# Patient Record
Sex: Female | Born: 1990 | Race: White | Hispanic: No | Marital: Single | Smoking: Never smoker
Health system: Southern US, Community
[De-identification: ages and names within clinical notes are randomized; demographics above are authoritative.]

## PROBLEM LIST (undated history)

## (undated) DIAGNOSIS — Z803 Family history of malignant neoplasm of breast: Secondary | ICD-10-CM

## (undated) DIAGNOSIS — N390 Urinary tract infection, site not specified: Secondary | ICD-10-CM

## (undated) DIAGNOSIS — T801XXA Vascular complications following infusion, transfusion and therapeutic injection, initial encounter: Secondary | ICD-10-CM

## (undated) DIAGNOSIS — Z8489 Family history of other specified conditions: Secondary | ICD-10-CM

## (undated) DIAGNOSIS — I809 Phlebitis and thrombophlebitis of unspecified site: Secondary | ICD-10-CM

## (undated) DIAGNOSIS — T8859XA Other complications of anesthesia, initial encounter: Secondary | ICD-10-CM

## (undated) DIAGNOSIS — K219 Gastro-esophageal reflux disease without esophagitis: Secondary | ICD-10-CM

## (undated) DIAGNOSIS — T4145XA Adverse effect of unspecified anesthetic, initial encounter: Secondary | ICD-10-CM

## (undated) HISTORY — PX: ABDOMINAL HYSTERECTOMY: SHX81

## (undated) HISTORY — PX: TUBAL LIGATION: SHX77

## (undated) HISTORY — DX: Family history of malignant neoplasm of breast: Z80.3

## (undated) HISTORY — PX: TONSILLECTOMY: SUR1361

---
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
## 2008-06-10 ENCOUNTER — Emergency Department (HOSPITAL_COMMUNITY): Admission: EM | Admit: 2008-06-10 | Discharge: 2008-06-11 | Payer: Self-pay | Admitting: Emergency Medicine

## 2008-08-14 ENCOUNTER — Emergency Department (HOSPITAL_COMMUNITY): Admission: EM | Admit: 2008-08-14 | Discharge: 2008-08-14 | Payer: Self-pay | Admitting: Emergency Medicine

## 2009-03-06 ENCOUNTER — Ambulatory Visit (HOSPITAL_COMMUNITY): Admission: RE | Admit: 2009-03-06 | Discharge: 2009-03-07 | Payer: Self-pay | Admitting: Obstetrics & Gynecology

## 2009-03-08 ENCOUNTER — Inpatient Hospital Stay (HOSPITAL_COMMUNITY): Admission: AD | Admit: 2009-03-08 | Discharge: 2009-03-08 | Payer: Self-pay | Admitting: Obstetrics and Gynecology

## 2009-04-02 ENCOUNTER — Emergency Department (HOSPITAL_COMMUNITY): Admission: EM | Admit: 2009-04-02 | Discharge: 2009-04-02 | Payer: Self-pay | Admitting: Family Medicine

## 2010-03-30 LAB — URINALYSIS, ROUTINE W REFLEX MICROSCOPIC
Bilirubin Urine: NEGATIVE
Bilirubin Urine: NEGATIVE
Glucose, UA: NEGATIVE mg/dL
Ketones, ur: NEGATIVE mg/dL
Nitrite: NEGATIVE
Protein, ur: NEGATIVE mg/dL
Protein, ur: NEGATIVE mg/dL
pH: 6.5 (ref 5.0–8.0)

## 2010-03-30 LAB — CBC
HCT: 38.7 % (ref 36.0–46.0)
Hemoglobin: 14.5 g/dL (ref 12.0–15.0)
MCHC: 33.8 g/dL (ref 30.0–36.0)
MCV: 92.1 fL (ref 78.0–100.0)
Platelets: 221 10*3/uL (ref 150–400)
RBC: 4.2 MIL/uL (ref 3.87–5.11)
RBC: 4.66 MIL/uL (ref 3.87–5.11)
RDW: 12.5 % (ref 11.5–15.5)
RDW: 12.7 % (ref 11.5–15.5)
WBC: 11 10*3/uL — ABNORMAL HIGH (ref 4.0–10.5)
WBC: 7.5 10*3/uL (ref 4.0–10.5)
WBC: 7.8 10*3/uL (ref 4.0–10.5)

## 2010-03-30 LAB — DIFFERENTIAL
Basophils Relative: 0 % (ref 0–1)
Eosinophils Relative: 3 % (ref 0–5)
Monocytes Absolute: 0.8 10*3/uL (ref 0.1–1.0)

## 2010-03-30 LAB — BASIC METABOLIC PANEL
BUN: 7 mg/dL (ref 6–23)
CO2: 23 mEq/L (ref 19–32)
Calcium: 8.6 mg/dL (ref 8.4–10.5)
Creatinine, Ser: 0.59 mg/dL (ref 0.4–1.2)
Sodium: 136 mEq/L (ref 135–145)

## 2010-03-30 LAB — URINE MICROSCOPIC-ADD ON

## 2010-04-11 LAB — URINALYSIS, ROUTINE W REFLEX MICROSCOPIC
Ketones, ur: NEGATIVE mg/dL
Nitrite: POSITIVE — AB
Protein, ur: 100 mg/dL — AB
Specific Gravity, Urine: 1.015 (ref 1.005–1.030)
Urobilinogen, UA: >8 mg/dL — ABNORMAL HIGH (ref 0.0–1.0)

## 2010-04-11 LAB — BASIC METABOLIC PANEL
BUN: 17 mg/dL (ref 6–23)
Calcium: 8.9 mg/dL (ref 8.4–10.5)
Creatinine, Ser: 0.54 mg/dL (ref 0.4–1.2)
GFR calc Af Amer: >60 mL/min (ref >60)
Potassium: 3.7 mEq/L (ref 3.5–5.1)
Sodium: 137 mEq/L (ref 135–145)

## 2010-04-11 LAB — CBC
Platelets: 263 10*3/uL (ref 150–400)
RBC: 4.56 MIL/uL (ref 3.87–5.11)

## 2010-04-11 LAB — URINE MICROSCOPIC-ADD ON

## 2010-04-11 LAB — DIFFERENTIAL
Basophils Relative: 0 % (ref 0–1)
Monocytes Relative: 8 % (ref 3–12)
Neutrophils Relative %: 76 % (ref 43–77)

## 2010-04-11 LAB — WET PREP, GENITAL: Yeast Wet Prep HPF POC: NONE SEEN

## 2010-04-11 LAB — GC/CHLAMYDIA PROBE AMP, GENITAL: GC Probe Amp, Genital: NEGATIVE

## 2010-04-13 LAB — URINE CULTURE: Colony Count: 60000

## 2010-04-13 LAB — URINE MICROSCOPIC-ADD ON

## 2010-04-13 LAB — URINALYSIS, ROUTINE W REFLEX MICROSCOPIC
Glucose, UA: 100 mg/dL — AB
Nitrite: POSITIVE — AB
Specific Gravity, Urine: 1.015 (ref 1.005–1.030)
Urobilinogen, UA: 4 mg/dL — ABNORMAL HIGH (ref 0.0–1.0)

## 2010-04-13 LAB — PREGNANCY, URINE: Preg Test, Ur: NEGATIVE

## 2010-05-19 NOTE — Op Note (Signed)
NAMEELYSE, Stephanie Hayden               ACCOUNT NO.:  [id number]   MEDICAL RECORD NO.:  [id number]          PATIENT TYPE:  AMB   LOCATION:  [facility]                         FACILITY:  [facility]   PHYSICIAN:  Martina Sinner, MD DATE OF BIRTH:  [birth year]   DATE OF PROCEDURE:  11/09/2007  DATE OF DISCHARGE:                               OPERATIVE REPORT   PREOPERATIVE DIAGNOSIS:  Pelvic pain.   POSTOPERATIVE DIAGNOSIS:  Pelvic pain.   SURGERY:  Cystoscopy, bladder hydrodistention.   PROCEDURE IN DETAIL:  Rani Idler has pelvic pain and frequency.  My  index of suspicion was moderate that she had interstitial cystitis.  She  was prepped and draped in the usual fashion.  She had a little bit of  issue with ciprofloxacin IV prior to the case but in my opinion she was  not allergic to it and anesthesia agreed.   A 21 [place] scope was utilized for the examination.  Bladder mucosa and  trigone were normal.  There was no stitch, foreign body or carcinoma.  She was hydrodistended twice to 900 mL.  On reinspection of the bladder  on both occasions there was no glomerulations.  There was no bleeding.  Urethra was normal.  Trigone was normal.  Ureteral orifices did not look  like they were refluxing.  I did not instill any medicine into her  bladder.   Ms. Marthann Schiller was taken to the recovery room.  I am not convinced she has  interstitial cystitis.  I am going to review her chart and hopefully we  can help her symptoms but I am not going to label her as having  interstitial cystitis at this point in time.           ______________________________  Martina Sinner, MD  Electronically Signed     SAM/MEDQ  D:  11/09/2007  T:  11/09/2007  Job:  [id number]

## 2010-10-05 LAB — URINALYSIS, ROUTINE W REFLEX MICROSCOPIC
Bilirubin Urine: NEGATIVE
Glucose, UA: NEGATIVE
Ketones, ur: NEGATIVE
Nitrite: NEGATIVE
Specific Gravity, Urine: <1.005 — ABNORMAL LOW
pH: 6

## 2010-10-05 LAB — URINE CULTURE: Colony Count: >=100000

## 2010-10-05 LAB — URINE MICROSCOPIC-ADD ON

## 2010-10-06 LAB — POCT HEMOGLOBIN-HEMACUE: Hemoglobin: 14.6

## 2010-10-14 LAB — URINE MICROSCOPIC-ADD ON

## 2010-10-14 LAB — URINALYSIS, ROUTINE W REFLEX MICROSCOPIC
Bilirubin Urine: NEGATIVE
Nitrite: POSITIVE — AB
Specific Gravity, Urine: >1.03 — ABNORMAL HIGH
Urobilinogen, UA: 0.2
pH: 6.5

## 2010-10-15 LAB — URINALYSIS, ROUTINE W REFLEX MICROSCOPIC
Bilirubin Urine: NEGATIVE
Glucose, UA: NEGATIVE
Protein, ur: 30 — AB
pH: 5

## 2010-10-15 LAB — URINE MICROSCOPIC-ADD ON

## 2010-10-15 LAB — PREGNANCY, URINE: Preg Test, Ur: NEGATIVE

## 2010-12-23 ENCOUNTER — Inpatient Hospital Stay (HOSPITAL_COMMUNITY)
Admission: EM | Admit: 2010-12-23 | Discharge: 2010-12-26 | DRG: 886 | Disposition: A | Discharge status: Home / Self Care | Payer: BC Managed Care – PPO | Attending: Family Medicine | Admitting: Family Medicine

## 2010-12-23 ENCOUNTER — Encounter: Payer: Self-pay | Admitting: *Deleted

## 2010-12-23 DIAGNOSIS — N12 Tubulo-interstitial nephritis, not specified as acute or chronic: Secondary | ICD-10-CM | POA: Diagnosis present

## 2010-12-23 DIAGNOSIS — O2390 Unspecified genitourinary tract infection in pregnancy, unspecified trimester: Principal | ICD-10-CM | POA: Diagnosis present

## 2010-12-23 DIAGNOSIS — O2300 Infections of kidney in pregnancy, unspecified trimester: Secondary | ICD-10-CM

## 2010-12-23 HISTORY — DX: Urinary tract infection, site not specified: N39.0

## 2010-12-23 LAB — BASIC METABOLIC PANEL
BUN: 6 mg/dL (ref 6–23)
CO2: 25 mEq/L (ref 19–32)
Calcium: 9.7 mg/dL (ref 8.4–10.5)
Chloride: 99 mEq/L (ref 96–112)
Creatinine, Ser: 0.6 mg/dL (ref 0.50–1.10)
GFR calc Af Amer: >90 mL/min (ref >90)
GFR calc non Af Amer: >90 mL/min (ref >90)
Glucose, Bld: 99 mg/dL (ref 70–99)
Potassium: 3.3 mEq/L — ABNORMAL LOW (ref 3.5–5.1)
Sodium: 135 mEq/L (ref 135–145)

## 2010-12-23 LAB — HCG, QUANTITATIVE, PREGNANCY: hCG, Beta Chain, Quant, S: 28 m[IU]/mL — ABNORMAL HIGH (ref <5)

## 2010-12-23 LAB — URINALYSIS, ROUTINE W REFLEX MICROSCOPIC
Nitrite: NEGATIVE
Specific Gravity, Urine: 1.01 (ref 1.005–1.030)
Urobilinogen, UA: 2 mg/dL — ABNORMAL HIGH (ref 0.0–1.0)
pH: 6 (ref 5.0–8.0)

## 2010-12-23 LAB — CBC
HCT: 43.1 % (ref 36.0–46.0)
MCH: 29.9 pg (ref 26.0–34.0)
MCHC: 33.2 g/dL (ref 30.0–36.0)
MCV: 90 fL (ref 78.0–100.0)
RDW: 12.6 % (ref 11.5–15.5)

## 2010-12-23 LAB — URINE MICROSCOPIC-ADD ON

## 2010-12-23 LAB — PREGNANCY, URINE: Preg Test, Ur: POSITIVE

## 2010-12-23 MED ORDER — DEXTROSE 5 % IV SOLN
1.0000 g | Freq: Once | INTRAVENOUS | Status: AC
  Administered 2010-12-23: 1 g via INTRAVENOUS
  Filled 2010-12-23: qty 10

## 2010-12-23 MED ORDER — ACETAMINOPHEN 500 MG PO TABS
1000.0000 mg | ORAL_TABLET | Freq: Three times a day (TID) | ORAL | Status: DC | PRN
  Administered 2010-12-23 – 2010-12-25 (×5): 1000 mg via ORAL
  Filled 2010-12-23: qty 1
  Filled 2010-12-23 (×5): qty 2

## 2010-12-23 MED ORDER — SODIUM CHLORIDE 0.9 % IV SOLN
INTRAVENOUS | Status: AC
  Administered 2010-12-23 (×2): via INTRAVENOUS

## 2010-12-23 MED ORDER — SODIUM CHLORIDE 0.9 % IV BOLUS (SEPSIS)
1000.0000 mL | Freq: Once | INTRAVENOUS | Status: AC
  Administered 2010-12-23: 1000 mL via INTRAVENOUS

## 2010-12-23 MED ORDER — ONDANSETRON HCL 4 MG/2ML IJ SOLN: 4.0000 mg | Freq: Three times a day (TID) | INTRAMUSCULAR | Status: AC | PRN

## 2010-12-23 NOTE — ED Provider Notes (Signed)
History     CSN: [id number] Arrival date & time: 12/23/2010  6:33 PM   First MD Initiated Contact with Patient 12/23/10 1843      Chief Complaint  Patient presents with  . Fever    (Consider location/radiation/quality/duration/timing/severity/associated sxs/prior treatment) Patient is a 20 y.o. female presenting with fever.  Fever Primary symptoms of the febrile illness include fever. Primary symptoms do not include vomiting.   The patient presents with one day of left flank pain. She has been in gradually, since onset has been persistent, sharp, crampy. There is radiation to the left inguinal crease. The patient also complains of fever or dysuria. The patient's pain and fever do not improve with Tylenol. She notes mild nausea, no vomiting, no diarrhea. No vaginal discharge, no vaginal bleeding, no vaginal pain Past Medical History  Diagnosis Date  . Endometriosis   . Urinary tract infection     Past Surgical History  Procedure Date  . Endometrial ablation     No family history on file.  History  Substance Use Topics  . Smoking status: Never Smoker   . Smokeless tobacco: Not on file  . Alcohol Use: No    OB History    Grav Para Term Preterm Abortions TAB SAB Ect Mult Living                  Review of Systems  Constitutional: Positive for fever.       HPI  HENT:       HPI otherwise negative  Eyes: Negative.   Respiratory:       HPI, otherwise negative  Cardiovascular:       HPI, otherwise nmegative  Gastrointestinal: Negative for vomiting.  Genitourinary:       HPI, otherwise negative  Musculoskeletal:       HPI, otherwise negative  Skin: Negative.   Neurological: Negative for syncope.    Allergies  Coconut flavor and Morphine and related  Home Medications   Current Outpatient Rx  Name Route Sig Dispense Refill  . PSEUDOEPHEDRINE-APAP-DM 60-1000-30 MG/30ML PO LIQD Oral Take 10 mLs by mouth every 4 (four) hours as needed. For cold symptoms        BP 130/92  Pulse 133  Temp(Src) 100.6 F (38.1 C) (Oral)  Resp 20  Ht 5\' 6"  (1.676 m)  Wt 155 lb (70.308 kg)  BMI 25.02 kg/m2  SpO2 100%  LMP 11/14/2010  Physical Exam  Constitutional: She is oriented to person, place, and time. She appears well-developed and well-nourished.  HENT:  Head: Normocephalic and atraumatic.  Eyes: EOM are normal.  Cardiovascular: Regular rhythm.        Tachycardic  Pulmonary/Chest: Effort normal and breath sounds normal.  Abdominal: Normal appearance. She exhibits no distension. There is CVA tenderness.  Musculoskeletal: She exhibits no edema and no tenderness.  Neurological: She is alert and oriented to person, place, and time.  Skin: Skin is warm and dry.    ED Course  Procedures (including critical care time)  Labs Reviewed  URINALYSIS, ROUTINE W REFLEX MICROSCOPIC - Abnormal; Notable for the following:    APPearance CLOUDY (*)    Hgb urine dipstick MODERATE (*)    Ketones, ur 40 (*)    Urobilinogen, UA 2.0 (*)    Leukocytes, UA SMALL (*)    All other components within normal limits  URINE MICROSCOPIC-ADD ON - Abnormal; Notable for the following:    Squamous Epithelial / LPF FEW (*)    Bacteria, UA MANY (*)  All other components within normal limits  PREGNANCY, URINE  CBC  BASIC METABOLIC PANEL   No results found.   No diagnosis found.    MDM  This generally well young female presents with new low back pain urinary frequency, fever, generalized discomfort. On exam the patient is uncomfortable appearing, mildly febrile and tachycardic. The patient's labs are notable for evidence of urinary tract infection, with leukocytosis; concern for pyelonephritis.  Given this constellation of symptoms, and the patient's newfound pregnant status, she was admitted for IV hydration, antibiotics.        Gerhard Munch, MD 12/23/10 2221

## 2010-12-23 NOTE — ED Notes (Signed)
Patient is resting comfortably.  Pt report was called but nurse unable to take at this time.  Delay explained to pt.

## 2010-12-23 NOTE — ED Notes (Signed)
Lower back pain, urinary frequency, fever. Also states dysuria. Symptoms began last night.

## 2010-12-24 ENCOUNTER — Encounter (HOSPITAL_COMMUNITY): Payer: Self-pay | Admitting: *Deleted

## 2010-12-24 LAB — HCG, QUANTITATIVE, PREGNANCY: hCG, Beta Chain, Quant, S: 36 m[IU]/mL — ABNORMAL HIGH (ref <5)

## 2010-12-24 MED ORDER — LORAZEPAM 0.5 MG PO TABS: 0.5000 mg | ORAL_TABLET | Freq: Four times a day (QID) | ORAL | Status: DC | PRN

## 2010-12-24 MED ORDER — DEXTROSE 5 % IV SOLN
1.0000 g | INTRAVENOUS | Status: DC
  Administered 2010-12-24 – 2010-12-25 (×2): 1 g via INTRAVENOUS
  Filled 2010-12-24 (×4): qty 10

## 2010-12-24 MED ORDER — LORAZEPAM 0.5 MG PO TABS
0.5000 mg | ORAL_TABLET | Freq: Every day | ORAL | Status: DC
  Administered 2010-12-24: 0.5 mg via ORAL
  Filled 2010-12-24 (×2): qty 1

## 2010-12-24 NOTE — ED Notes (Signed)
Report called to 3A Victorino Dike, RN

## 2010-12-24 NOTE — H&P (Signed)
NAME:  Stephanie Hayden, Stephanie Hayden NO.:  [id number]  MEDICAL RECORD NO.:  [id number]  LOCATION:  A327                          FACILITY:  [facility]  PHYSICIAN:  Melvyn Novas, MDDATE OF BIRTH:  1990-07-14  DATE OF ADMISSION:  12/23/2010 DATE OF DISCHARGE:  LH                             HISTORY & PHYSICAL   The patient is a 20 year old married white female who is 8 weeks' pregnant, who apparently complained of left lower back pain preceding 36- 48 hours, presents to the ER, found to have evidence clinically and laboratory wise of UTI, with low back pain, febrile, and mildly volume depleted, concerned for pyelonephritis, was admitted and given IV Rocephin for clinical pyelonephritis.  She had a small amount of leukocytes in the urine, many bacteria noted, with a leukocytosis, white count of 17,700.  There was no nausea, vomiting, hematemesis, melena, or hematochezia.  No dyspnea.  No orthopnea or PND.  No cough or sputum.  PAST MEDICAL HISTORY:  Significant for endometriosis and a history of UTI.  PAST SURGICAL HISTORY:  Remarkable for endometrial ablation due to endometriosis.  LMP was 8 weeks ago.  ALLERGIES:  She has questionable allergies or intolerances to MORPHINE.  SOCIAL HISTORY:  She is nonsmoker and nondrinker.  Her husband is in the [employer].  CURRENT MEDICATIONS:  None.  PHYSICAL EXAMINATION:  VITAL SIGNS:  Blood pressure at present is 98/84, pulse is 82 and regular.  She is currently afebrile, was febrile on admission.  Respiratory rate is 18. HEENT:  Eyes, PERRLA.  Extraocular movements intact.  Sclerae clear. Conjunctivae pink. NECK:  Showed no JVD.  No carotid bruits, no thyromegaly, no thyroid bruits. LUNGS:  Clear to A and P.  No rales, wheeze, or rhonchi appreciable. HEART:  Regular rhythm.  No murmurs, gallops, heaves, thrills, or rubs. ABDOMEN:  Soft, nontender.  Bowel sounds normoactive.  No guarding, rebound, mass, or  hepatosplenomegaly.  There is left flank tenderness to percussion.  No right flank tenderness. EXTREMITIES:  No clubbing, cyanosis, or edema.  No cords and Homans sign negative. NEUROLOGIC:  Cranial nerves II-XII grossly intact.  The patient moves all 4 extremities.  IMPRESSION: 1. Pyelonephritis, left sided. 2. Urinary tract infection. 3. Eight-week pregnancy. 4. History of endometriosis.  PLAN:  To admit, give aggressive IV fluid hydration.  She also has mild hypokalemia, replenish potassium.  Rocephin 1 g IV q.24 hours.  Monitor BMET daily.  We will give compression stockings for DVT prophylaxis.  I will make further recommendations as the database expands.     Melvyn Novas, MD     RMD/MEDQ  D:  12/24/2010  T:  12/24/2010  Job:  [id number]

## 2010-12-24 NOTE — H&P (Signed)
[id number] 

## 2010-12-25 ENCOUNTER — Inpatient Hospital Stay (HOSPITAL_COMMUNITY): Payer: BC Managed Care – PPO

## 2010-12-25 LAB — BASIC METABOLIC PANEL
Calcium: 8.9 mg/dL (ref 8.4–10.5)
GFR calc Af Amer: >90 mL/min (ref >90)
GFR calc non Af Amer: >90 mL/min (ref >90)
Potassium: 3.5 mEq/L (ref 3.5–5.1)
Sodium: 139 mEq/L (ref 135–145)

## 2010-12-25 LAB — CBC
Hemoglobin: 12.9 g/dL (ref 12.0–15.0)
Platelets: 251 10*3/uL (ref 150–400)
RBC: 4.27 MIL/uL (ref 3.87–5.11)

## 2010-12-25 LAB — DIFFERENTIAL
Basophils Absolute: 0 10*3/uL (ref 0.0–0.1)
Basophils Relative: 0 % (ref 0–1)
Eosinophils Absolute: 0.2 10*3/uL (ref 0.0–0.7)
Neutro Abs: 4.8 10*3/uL (ref 1.7–7.7)
Neutrophils Relative %: 59 % (ref 43–77)

## 2010-12-25 LAB — HCG, QUANTITATIVE, PREGNANCY
hCG, Beta Chain, Quant, S: 43 m[IU]/mL — ABNORMAL HIGH (ref <5)
hCG, Beta Chain, Quant, S: 57 m[IU]/mL — ABNORMAL HIGH (ref <5)

## 2010-12-25 LAB — TSH: TSH: 0.762 u[IU]/mL (ref 0.350–4.500)

## 2010-12-25 NOTE — Progress Notes (Signed)
Pt states she is having a moderate amt of brownish bloody discharge.  HCG levels are trending upwards.  Spoke with nurse at patients OB-GYN office in [place] [facility]).  Pt has a f/u appt with this office Jan 3rd, 2013. Recommends a pelvic ultrasound.  Notified Dr. Janna Arch who agreed (orders entered).  Spoke with pt about plan of care.

## 2010-12-25 NOTE — Progress Notes (Signed)
NAME:  Stephanie Hayden, Stephanie Hayden NO.:  [id number]  MEDICAL RECORD NO.:  [id number]  LOCATION:  A327                          FACILITY:  [facility]  PHYSICIAN:  Melvyn Novas, MDDATE OF BIRTH:  1990-07-24  DATE OF PROCEDURE: DATE OF DISCHARGE:                                PROGRESS NOTE   The patient has clinical pyelonephritis, left side, responding to Rocephin therapy, currently defervescing.  She has had a day and half with sanguineous vaginal discharge.  No abdominal cramping, back pain other than original pyelonephritis pain.  No contractions to speak of, clinically performed quantitative beta hCG which on December 23, 2010, was 54, on December 25, 2010, today increased to 43, there is no hard objective evidence of any threatened abortion.  At present, could be just vaginal discharge through antibiotics which appear sanguinous.  She is currently on bedrest with compression stockings as DVT prophylaxis. The patient went down for pelvic ultrasound today, potassium now repleted at normal 3.5, creatinine 0.55.  Physical examination is not feasible at present, she is off the floor.  The plan right now is to get CBC in a.m., BMET in a.m., continue Rocephin 1 g IV q.24 hours.  Continue hydraulic compression for DVT prophylaxis and try re-ensure patient concerning no objective evidence of threat to abortion.     Melvyn Novas, MD     RMD/MEDQ  D:  12/25/2010  T:  12/25/2010  Job:  [id number]

## 2010-12-25 NOTE — Progress Notes (Signed)
[id number] 

## 2010-12-26 LAB — BASIC METABOLIC PANEL
CO2: 23 mEq/L (ref 19–32)
Calcium: 9.1 mg/dL (ref 8.4–10.5)
Chloride: 105 mEq/L (ref 96–112)
Creatinine, Ser: 0.6 mg/dL (ref 0.50–1.10)
GFR calc Af Amer: >90 mL/min (ref >90)
Sodium: 138 mEq/L (ref 135–145)

## 2010-12-26 LAB — HCG, QUANTITATIVE, PREGNANCY: hCG, Beta Chain, Quant, S: 85 m[IU]/mL — ABNORMAL HIGH (ref <5)

## 2010-12-26 MED ORDER — NITROFURANTOIN MONOHYD MACRO 100 MG PO CAPS: 100.0000 mg | ORAL_CAPSULE | Freq: Two times a day (BID) | ORAL | Status: AC

## 2010-12-26 NOTE — Discharge Summary (Signed)
NAME:  PHIL, CORTI NO.:  [id number]  MEDICAL RECORD NO.:  [id number]  LOCATION:  A327                          FACILITY:  [facility]  PHYSICIAN:  Melvyn Novas, MDDATE OF BIRTH:  1990-10-31  DATE OF ADMISSION:  12/23/2010 DATE OF DISCHARGE:  12/22/2012LH                              DISCHARGE SUMMARY   The patient is a 20 year old married white female who is pregnant somewhere between 1 and 5 weeks.  LMP was November 14, 2010, who presented with left-sided pyelonephritis, documented UTI in the ER, severe flank pain, fever, leukocytosis.  Essentially, the patient was admitted, placed on IV Rocephin with resolution of symptomatology over 72 hours.  She had no fever, no rigors, no chills.  Her white count defervesce from 17-9.4.  On the day prior to discharge, she had some vaginal sanguinous discharge, which might have been from antibiotic usage, was uncertain, however, serial beta hCG quantitative revealed serial escalation of her hCG over 3 successive days including the day of discharge, so there is no objective data of threatened abortion at present.  The patient was reassured and husband.  Rocephin was continued for 4-1/2 -day period, I think 5 total doses 21 g IV q.24 and subsequently discharged on Macrobid 100 mg p.o. b.i.d. for an additional 5 days.  Prescription was given to the patient.  She is to take no other medicines including aspirin or Tylenol.  She is to have bed rest, not totally, and to follow up with her GYN as an outpatient for vaginal discharge and question of sanguinous discharge.     Melvyn Novas, MD     RMD/MEDQ  D:  12/26/2010  T:  12/26/2010  Job:  [id number]

## 2010-12-26 NOTE — Progress Notes (Signed)
Pt given d/c instructions and new prescription.  Discussed home care with patient and discussed home medications, patient verbalizes understanding. F/U appointment in place with OB-GYN, pt states they will keep appointment. Pt is stable at this time. Pt ambulated to main entrance with husband and  staff member (refused wheelchair).

## 2010-12-26 NOTE — Discharge Summary (Signed)
[id number] 

## 2010-12-29 LAB — CULTURE, BLOOD (ROUTINE X 2)
Culture: NO GROWTH
Culture: NO GROWTH

## 2011-05-03 ENCOUNTER — Observation Stay: Payer: Self-pay

## 2011-05-03 LAB — URINALYSIS, COMPLETE
Bilirubin,UR: NEGATIVE
Blood: NEGATIVE
Ketone: NEGATIVE
Nitrite: NEGATIVE
Ph: 9 (ref 4.5–8.0)
RBC,UR: 2 /HPF (ref 0–5)
Squamous Epithelial: 18
WBC UR: 5 /HPF (ref 0–5)

## 2011-05-04 ENCOUNTER — Inpatient Hospital Stay: Payer: Self-pay | Admitting: Obstetrics and Gynecology

## 2011-05-04 LAB — COMPREHENSIVE METABOLIC PANEL
Albumin: 3 g/dL — ABNORMAL LOW (ref 3.4–5.0)
Anion Gap: 11 (ref 7–16)
BUN: 4 mg/dL — ABNORMAL LOW (ref 7–18)
Bilirubin,Total: 0.4 mg/dL (ref 0.2–1.0)
Calcium, Total: 8.4 mg/dL — ABNORMAL LOW (ref 8.5–10.1)
Chloride: 105 mmol/L (ref 98–107)
Co2: 21 mmol/L (ref 21–32)
EGFR (African American): >60
EGFR (Non-African Amer.): >60
Glucose: 113 mg/dL — ABNORMAL HIGH (ref 65–99)
Potassium: 3.3 mmol/L — ABNORMAL LOW (ref 3.5–5.1)
SGOT(AST): 17 U/L (ref 15–37)
Sodium: 137 mmol/L (ref 136–145)
Total Protein: 6.9 g/dL (ref 6.4–8.2)

## 2011-05-04 LAB — URINALYSIS, COMPLETE
Blood: NEGATIVE
Nitrite: NEGATIVE
Ph: 5 (ref 4.5–8.0)
Protein: 30
RBC,UR: 4 /HPF (ref 0–5)
Squamous Epithelial: 1
WBC UR: 5 /HPF (ref 0–5)

## 2011-05-05 LAB — URINE CULTURE

## 2011-05-06 LAB — BASIC METABOLIC PANEL
Anion Gap: 8 (ref 7–16)
Chloride: 109 mmol/L — ABNORMAL HIGH (ref 98–107)
Co2: 25 mmol/L (ref 21–32)
EGFR (Non-African Amer.): >60
Glucose: 80 mg/dL (ref 65–99)
Potassium: 3.3 mmol/L — ABNORMAL LOW (ref 3.5–5.1)
Sodium: 142 mmol/L (ref 136–145)

## 2011-05-07 LAB — BASIC METABOLIC PANEL
Anion Gap: 8 (ref 7–16)
Chloride: 106 mmol/L (ref 98–107)
Co2: 24 mmol/L (ref 21–32)
EGFR (African American): >60
EGFR (Non-African Amer.): >60
Glucose: 68 mg/dL (ref 65–99)
Osmolality: 273 (ref 275–301)
Potassium: 3.5 mmol/L (ref 3.5–5.1)

## 2011-08-30 ENCOUNTER — Observation Stay: Payer: Self-pay | Admitting: Advanced Practice Midwife

## 2011-08-31 ENCOUNTER — Inpatient Hospital Stay: Payer: Self-pay

## 2011-08-31 LAB — CBC WITH DIFFERENTIAL/PLATELET
Basophil %: 0.2 %
Eosinophil %: 0.9 %
HCT: 32 % — ABNORMAL LOW (ref 35.0–47.0)
HGB: 10.3 g/dL — ABNORMAL LOW (ref 12.0–16.0)
Lymphocyte %: 16.2 %
MCH: 24.8 pg — ABNORMAL LOW (ref 26.0–34.0)
MCV: 77 fL — ABNORMAL LOW (ref 80–100)
Monocyte #: 1.1 x10 3/mm — ABNORMAL HIGH (ref 0.2–0.9)
Neutrophil %: 73.2 %
RBC: 4.18 10*6/uL (ref 3.80–5.20)

## 2011-09-01 LAB — BASIC METABOLIC PANEL
Anion Gap: 10 (ref 7–16)
Calcium, Total: 9 mg/dL (ref 8.5–10.1)
Chloride: 105 mmol/L (ref 98–107)
Osmolality: 273 (ref 275–301)
Potassium: 3.7 mmol/L (ref 3.5–5.1)

## 2011-09-02 LAB — HEMATOCRIT: HCT: 29.7 % — ABNORMAL LOW (ref 35.0–47.0)

## 2012-10-10 ENCOUNTER — Emergency Department (HOSPITAL_COMMUNITY): Payer: Self-pay

## 2012-10-10 ENCOUNTER — Encounter (HOSPITAL_COMMUNITY): Payer: Self-pay | Admitting: *Deleted

## 2012-10-10 ENCOUNTER — Emergency Department (HOSPITAL_COMMUNITY)
Admission: EM | Admit: 2012-10-10 | Discharge: 2012-10-10 | Disposition: A | Discharge status: Home / Self Care | Payer: Self-pay | Attending: Emergency Medicine | Admitting: Emergency Medicine

## 2012-10-10 DIAGNOSIS — R110 Nausea: Secondary | ICD-10-CM | POA: Insufficient documentation

## 2012-10-10 DIAGNOSIS — Z8742 Personal history of other diseases of the female genital tract: Secondary | ICD-10-CM | POA: Insufficient documentation

## 2012-10-10 DIAGNOSIS — R1012 Left upper quadrant pain: Secondary | ICD-10-CM | POA: Insufficient documentation

## 2012-10-10 DIAGNOSIS — R631 Polydipsia: Secondary | ICD-10-CM | POA: Insufficient documentation

## 2012-10-10 DIAGNOSIS — R002 Palpitations: Secondary | ICD-10-CM | POA: Insufficient documentation

## 2012-10-10 DIAGNOSIS — R0602 Shortness of breath: Secondary | ICD-10-CM | POA: Insufficient documentation

## 2012-10-10 DIAGNOSIS — Z3202 Encounter for pregnancy test, result negative: Secondary | ICD-10-CM | POA: Insufficient documentation

## 2012-10-10 DIAGNOSIS — Z87440 Personal history of urinary (tract) infections: Secondary | ICD-10-CM | POA: Insufficient documentation

## 2012-10-10 DIAGNOSIS — R42 Dizziness and giddiness: Secondary | ICD-10-CM | POA: Insufficient documentation

## 2012-10-10 DIAGNOSIS — R197 Diarrhea, unspecified: Secondary | ICD-10-CM | POA: Insufficient documentation

## 2012-10-10 DIAGNOSIS — R5381 Other malaise: Secondary | ICD-10-CM | POA: Insufficient documentation

## 2012-10-10 LAB — TROPONIN I: Troponin I: <0.3 ng/mL (ref <0.30)

## 2012-10-10 LAB — CBC
HCT: 44 % (ref 36.0–46.0)
Hemoglobin: 14.9 g/dL (ref 12.0–15.0)
MCHC: 33.9 g/dL (ref 30.0–36.0)
RDW: 12.6 % (ref 11.5–15.5)
WBC: 6.2 10*3/uL (ref 4.0–10.5)

## 2012-10-10 LAB — URINALYSIS, ROUTINE W REFLEX MICROSCOPIC
Bilirubin Urine: NEGATIVE
Glucose, UA: NEGATIVE mg/dL
Nitrite: NEGATIVE
Specific Gravity, Urine: <1.005 — ABNORMAL LOW (ref 1.005–1.030)
Urobilinogen, UA: 0.2 mg/dL (ref 0.0–1.0)
pH: 6 (ref 5.0–8.0)

## 2012-10-10 LAB — BASIC METABOLIC PANEL
BUN: 7 mg/dL (ref 6–23)
Chloride: 103 mEq/L (ref 96–112)
GFR calc Af Amer: >90 mL/min (ref >90)
GFR calc non Af Amer: >90 mL/min (ref >90)
Potassium: 3.6 mEq/L (ref 3.5–5.1)
Sodium: 140 mEq/L (ref 135–145)

## 2012-10-10 LAB — HEPATIC FUNCTION PANEL
ALT: 10 U/L (ref 0–35)
AST: 15 U/L (ref 0–37)
Albumin: 4.5 g/dL (ref 3.5–5.2)
Total Protein: 8 g/dL (ref 6.0–8.3)

## 2012-10-10 LAB — URINE MICROSCOPIC-ADD ON

## 2012-10-10 NOTE — ED Notes (Signed)
Woke this a.m. "just not feeling good".  Went to work and took CBG = 84, BP was 150/100.  Has been feeling fatigued w/slight nausea.  Did 12 lead EKG at work and saw some abnormalities.  Denies any additional stressors.

## 2012-10-10 NOTE — ED Notes (Signed)
Pt states not feeling well this morning, c/o nausea-no vomiting, denies pain, states had a brief episode of dizziness, cbg 80 after eating breakfast

## 2012-10-10 NOTE — ED Provider Notes (Signed)
CSN: [id number]     Arrival date & time 10/10/12  1109 History  This chart was scribed for Audree Camel, MD, by Yevette Edwards, ED Scribe. This patient was seen in room APA11/APA11 and the patient's care was started at 11:28 AM.  First MD Initiated Contact with Patient 10/10/12 1126     Chief Complaint  Patient presents with  . Fatigue  . Nausea  . Hypertension   The history is provided by the patient. No language interpreter was used.   HPI Comments: Stephanie Hayden is a 22 y.o. female, with a h/o endometriosis,  who presents to the Emergency Department complaining of general malaise. The pt reports that upon awakening this morning, she has experienced dizziness, lightheadedness, fatigue, mild SOB, mild palpitations, polydipsia and nausea. She also experienced diarrhea yesterday. After eating breakfast and still not feeling well, she checked her BP which was 150/100 and her CBG which was 84. She reports that she has had prior EKGs performed at work, she is an EMS, and she has never had an EKG with a similar reading. She denies any chest pain, vaginal pain, vaginal discharge, or hematochezia. She reports that she drinks only one cup of coffee a day, and she denies using any supplemental herbs or drugs. The pt is a non-smoker.   Dr. Tiburcio Pea is her OB-GYN.  Past Medical History  Diagnosis Date  . Endometriosis   . Urinary tract infection    Past Surgical History  Procedure Laterality Date  . Endometrial ablation    . Cesarean section     History reviewed. No pertinent family history. History  Substance Use Topics  . Smoking status: Never Smoker   . Smokeless tobacco: Not on file  . Alcohol Use: No   No OB history provided.  Review of Systems  Constitutional: Positive for fatigue. Negative for unexpected weight change.  Respiratory: Positive for shortness of breath (Mild ).   Cardiovascular: Positive for palpitations (Mild). Negative for chest pain.  Gastrointestinal: Positive  for nausea, abdominal pain and diarrhea (One episosde yesterday). Negative for constipation and blood in stool.  Endocrine: Positive for polydipsia.  Genitourinary: Negative for vaginal discharge and vaginal pain.  Neurological: Positive for dizziness and light-headedness.  All other systems reviewed and are negative.    Allergies  Coconut flavor and Morphine and related  Home Medications  No current outpatient prescriptions on file.  Triage Vitals: BP 106/77  Pulse 83  Temp(Src) 97.9 F (36.6 C) (Oral)  Resp 16  Ht 5\' 7"  (1.702 m)  Wt 160 lb (72.576 kg)  BMI 25.05 kg/m2  SpO2 100%  Physical Exam  Nursing note and vitals reviewed. Constitutional: She is oriented to person, place, and time. She appears well-developed and well-nourished. No distress.  HENT:  Head: Normocephalic and atraumatic.  Eyes: EOM are normal.  Neck: Neck supple. No tracheal deviation present.  Cardiovascular: Normal rate, regular rhythm, normal heart sounds and intact distal pulses.   No murmur heard. Pulmonary/Chest: Effort normal and breath sounds normal. No respiratory distress. She has no wheezes.  Abdominal: There is tenderness.  Mild LUQ tenderness.   Musculoskeletal: Normal range of motion. She exhibits no tenderness.  Neurological: She is alert and oriented to person, place, and time.  Skin: Skin is warm and dry.  Psychiatric: She has a normal mood and affect. Her behavior is normal.    ED Course  Procedures (including critical care time)  DIAGNOSTIC STUDIES: Oxygen Saturation is 100% on room air, normal by  my interpretation.    COORDINATION OF CARE:  11:43 AM- Discussed treatment plan with patient, and the patient agreed to the plan.   Labs Review Labs Reviewed  BASIC METABOLIC PANEL - Abnormal; Notable for the following:    Glucose, Bld 103 (*)    All other components within normal limits  URINALYSIS, ROUTINE W REFLEX MICROSCOPIC - Abnormal; Notable for the following:     Specific Gravity, Urine <1.005 (*)    Hgb urine dipstick TRACE (*)    All other components within normal limits  URINE MICROSCOPIC-ADD ON - Abnormal; Notable for the following:    Bacteria, UA MANY (*)    All other components within normal limits  CBC  HEPATIC FUNCTION PANEL  TROPONIN I  PREGNANCY, URINE  TSH   Imaging Review Dg Chest 2 View  10/10/2012   CLINICAL DATA:  Initial encounter for fatigue, nausea, and abnormal EKG.  EXAM: CHEST  2 VIEW  COMPARISON:  None.  FINDINGS: Cardiomediastinal silhouette unremarkable. Lungs clear. Bronchovascular markings normal. Pulmonary vascularity normal. No pneumothorax. No pleural effusions. Visualized bony thorax intact.  IMPRESSION: Normal examination.   Electronically Signed   By: Hulan Saas M.D.   On: 10/10/2012 12:32    Date: 10/10/2012  Rate: 74  Rhythm: normal sinus rhythm  QRS Axis: normal  Intervals: normal  ST/T Wave abnormalities: normal  Conduction Disutrbances:QRS 102  Narrative Interpretation:   Old EKG Reviewed: none available   MDM   1. Palpitations    Patient is currently feeling normal. She only drinks minimal caffeine and does not have any alcohol intake. She's worried about an EKG that was taken at work, she showed it to me that it seems to be of poor quality. There no abnormalities that were present on that EKG that were worrisome. Her EKG shows mildly wide QRS. I discussed with Dr. branch of cardiology states that his measurements are QRS is normal and the computer is overestimating. Given her history of palpitations as a cause for her symptoms, we'll still give outpatient cardiology referral. Discussed return precautions the patient she verbalized understanding and will followup with cardiology.  I personally performed the services described in this documentation, which was scribed in my presence. The recorded information has been reviewed and is accurate.   Audree Camel, MD 10/10/12 1535

## 2013-04-07 ENCOUNTER — Emergency Department (HOSPITAL_COMMUNITY)
Admission: EM | Admit: 2013-04-07 | Discharge: 2013-04-07 | Disposition: A | Discharge status: Home / Self Care | Payer: Self-pay | Attending: Emergency Medicine | Admitting: Emergency Medicine

## 2013-04-07 ENCOUNTER — Encounter (HOSPITAL_COMMUNITY): Payer: Self-pay | Admitting: Emergency Medicine

## 2013-04-07 DIAGNOSIS — Z87440 Personal history of urinary (tract) infections: Secondary | ICD-10-CM | POA: Insufficient documentation

## 2013-04-07 DIAGNOSIS — Z79899 Other long term (current) drug therapy: Secondary | ICD-10-CM | POA: Insufficient documentation

## 2013-04-07 DIAGNOSIS — K120 Recurrent oral aphthae: Secondary | ICD-10-CM | POA: Insufficient documentation

## 2013-04-07 DIAGNOSIS — Z8742 Personal history of other diseases of the female genital tract: Secondary | ICD-10-CM | POA: Insufficient documentation

## 2013-04-07 DIAGNOSIS — M542 Cervicalgia: Secondary | ICD-10-CM | POA: Insufficient documentation

## 2013-04-07 DIAGNOSIS — J029 Acute pharyngitis, unspecified: Secondary | ICD-10-CM | POA: Insufficient documentation

## 2013-04-07 LAB — RAPID STREP SCREEN (MED CTR MEBANE ONLY): Streptococcus, Group A Screen (Direct): NEGATIVE

## 2013-04-07 MED ORDER — HYDROCODONE-ACETAMINOPHEN 5-325 MG PO TABS: 1.0000 | ORAL_TABLET | ORAL | Status: DC | PRN

## 2013-04-07 MED ORDER — ACETAMINOPHEN 500 MG PO TABS
1000.0000 mg | ORAL_TABLET | Freq: Once | ORAL | Status: AC
  Administered 2013-04-07: 1000 mg via ORAL
  Filled 2013-04-07: qty 2

## 2013-04-07 MED ORDER — MAGIC MOUTHWASH W/LIDOCAINE: 5.0000 mL | Freq: Three times a day (TID) | ORAL | Status: DC | PRN

## 2013-04-07 NOTE — ED Notes (Signed)
Pt co fever, sore throat, gum and mouth lesions since Wednesday.

## 2013-04-07 NOTE — ED Notes (Signed)
Pt alert & oriented x4, stable gait. Patient given discharge instructions, paperwork & prescription(s). Patient  instructed to stop at the registration desk to finish any additional paperwork. Patient verbalized understanding. Pt left department w/ no further questions. 

## 2013-04-07 NOTE — Discharge Instructions (Signed)
Thank you for allowing me to care for you today. I Stephanie Hayden you feel better soon and have a happy Easter. Try to stay away from your child while you are sick. Do not drive while taking the narcotic. You may continue to take ibuprofen in addition to the narcotic. Follow up with your doctor or return  Here immediately for difficulty swallowing or other problems.

## 2013-04-07 NOTE — ED Provider Notes (Signed)
CSN: [id number]     Arrival date & time 04/07/13  0930 History   First MD Initiated Contact with Patient 04/07/13 [phone]     Chief Complaint  Patient presents with  . Oral Swelling     (Consider location/radiation/quality/duration/timing/severity/associated sxs/prior Treatment) Patient is a 23 y.o. female presenting with pharyngitis. The history is provided by the patient.  Sore Throat This is a new problem. The current episode started in the past 7 days. The problem occurs constantly. The problem has been gradually worsening. Associated symptoms include chills, fatigue, a fever, myalgias, neck pain, a sore throat and swollen glands. Pertinent negatives include no anorexia, change in bowel habit, chest pain, congestion, coughing, nausea, numbness, rash, urinary symptoms, vertigo, visual change, vomiting or weakness. The symptoms are aggravated by swallowing and eating. She has tried NSAIDs for the symptoms. The treatment provided no relief.   LAILONI BAQUERA is a 23 y.o. female who presents to the ED with sore throat and gland swelling x 3 days. She reports fever up to 102 and has notices some blisters on her tongue and inside her mouth.  She has had Mono in the past and she has had her tonsils removed due to frequent strep and tonsil infections.   Past Medical History  Diagnosis Date  . Endometriosis   . Urinary tract infection    Past Surgical History  Procedure Laterality Date  . Endometrial ablation    . Cesarean section     History reviewed. No pertinent family history. History  Substance Use Topics  . Smoking status: Never Smoker   . Smokeless tobacco: Not on file  . Alcohol Use: No   OB History   Grav Para Term Preterm Abortions TAB SAB Ect Mult Living                 Review of Systems  Constitutional: Positive for fever, chills and fatigue.  HENT: Positive for sore throat. Negative for congestion.   Respiratory: Negative for cough.   Cardiovascular: Negative for chest  pain.  Gastrointestinal: Negative for nausea, vomiting, anorexia and change in bowel habit.  Musculoskeletal: Positive for myalgias and neck pain.  Skin: Negative for rash.  Neurological: Negative for vertigo, weakness and numbness.      Allergies  Coconut flavor and Morphine and related  Home Medications   Current Outpatient Rx  Name  Route  Sig  Dispense  Refill  . phentermine 37.5 MG capsule   Oral   Take 37.5 mg by mouth daily.          BP 129/86  Pulse 87  Temp(Src) 98.8 F (37.1 C) (Oral)  Resp 20  Ht 5\' 7"  (1.702 m)  Wt 160 lb (72.576 kg)  BMI 25.05 kg/m2  SpO2 98% Physical Exam  Nursing note and vitals reviewed. Constitutional: She is oriented to person, place, and time. She appears well-developed and well-nourished. No distress.  HENT:  Right Ear: Tympanic membrane normal.  Left Ear: Tympanic membrane normal.  Nose: Nose normal.  Mouth/Throat: Uvula is midline. Oral lesions present. No trismus in the jaw. Posterior oropharyngeal erythema present.  There is swelling and erythema of the gums surrounding the lower wisdom teeth. There are ulcer areas noted on the tongue and gums.   Eyes: EOM are normal.  Neck: Neck supple.  Pulmonary/Chest: Effort normal.  Abdominal: Soft. There is no tenderness.  Musculoskeletal: Normal range of motion.  Neurological: She is alert and oriented to person, place, and time. No cranial nerve deficit.  Skin: Skin is warm and dry.  Psychiatric: She has a normal mood and affect. Her behavior is normal.   Results for orders placed during the hospital encounter of 04/07/13 (from the past 24 hour(s))  RAPID STREP SCREEN     Status: None   Collection Time    04/07/13  9:56 AM      Result Value Ref Range   Streptococcus, Group A Screen (Direct) NEGATIVE  NEGATIVE     ED Course  Procedures   MDM  23 y.o. female with oral lesions, sore throat and gum swelling. Fever earlier in the week but none today. Strep screen negative. Will  treat for pain of oral ulcers she is stable for discharge without difficulty swallowing, she is afebrile and does not appear septic.  Discussed with the patient clinical and lab findings and plan of care and all questioned fully answered. She will reutn if any problems arise.    Medication List    TAKE these medications       HYDROcodone-acetaminophen 5-325 MG per tablet  Commonly known as:  NORCO/VICODIN  Take 1 tablet by mouth every 4 (four) hours as needed.     magic mouthwash w/lidocaine Soln  Take 5 mLs by mouth 3 (three) times daily as needed for mouth pain.      ASK your doctor about these medications       phentermine 37.5 MG capsule  Take 37.5 mg by mouth daily.           [place], [place] 04/07/13 1820

## 2013-04-08 NOTE — ED Provider Notes (Signed)
Medical screening examination/treatment/procedure(s) were performed by non-physician practitioner and as supervising physician I was immediately available for consultation/collaboration.   EKG Interpretation None       Jasper Riling. Alvino Chapel, [place] 04/08/13 [phone]

## 2013-04-09 ENCOUNTER — Emergency Department (HOSPITAL_COMMUNITY)
Admission: EM | Admit: 2013-04-09 | Discharge: 2013-04-09 | Disposition: A | Discharge status: Home / Self Care | Payer: Self-pay | Attending: Emergency Medicine | Admitting: Emergency Medicine

## 2013-04-09 ENCOUNTER — Encounter (HOSPITAL_COMMUNITY): Payer: Self-pay | Admitting: Emergency Medicine

## 2013-04-09 DIAGNOSIS — Z3202 Encounter for pregnancy test, result negative: Secondary | ICD-10-CM | POA: Insufficient documentation

## 2013-04-09 DIAGNOSIS — Z9889 Other specified postprocedural states: Secondary | ICD-10-CM | POA: Insufficient documentation

## 2013-04-09 DIAGNOSIS — Z8742 Personal history of other diseases of the female genital tract: Secondary | ICD-10-CM | POA: Insufficient documentation

## 2013-04-09 DIAGNOSIS — B9789 Other viral agents as the cause of diseases classified elsewhere: Secondary | ICD-10-CM | POA: Insufficient documentation

## 2013-04-09 DIAGNOSIS — B349 Viral infection, unspecified: Secondary | ICD-10-CM

## 2013-04-09 DIAGNOSIS — N390 Urinary tract infection, site not specified: Secondary | ICD-10-CM | POA: Insufficient documentation

## 2013-04-09 LAB — CBC WITH DIFFERENTIAL/PLATELET
BASOS ABS: 0 10*3/uL (ref 0.0–0.1)
BASOS PCT: 0 % (ref 0–1)
EOS ABS: 0 10*3/uL (ref 0.0–0.7)
EOS PCT: 0 % (ref 0–5)
HEMATOCRIT: 42.3 % (ref 36.0–46.0)
HEMOGLOBIN: 14 g/dL (ref 12.0–15.0)
Lymphocytes Relative: 21 % (ref 12–46)
Lymphs Abs: 1.5 10*3/uL (ref 0.7–4.0)
MCH: 28.5 pg (ref 26.0–34.0)
MCHC: 33.1 g/dL (ref 30.0–36.0)
MCV: 86 fL (ref 78.0–100.0)
MONO ABS: 0.4 10*3/uL (ref 0.1–1.0)
MONOS PCT: 5 % (ref 3–12)
Neutro Abs: 5.1 10*3/uL (ref 1.7–7.7)
Neutrophils Relative %: 73 % (ref 43–77)
Platelets: 284 10*3/uL (ref 150–400)
RBC: 4.92 MIL/uL (ref 3.87–5.11)
RDW: 13.3 % (ref 11.5–15.5)
WBC: 6.9 10*3/uL (ref 4.0–10.5)

## 2013-04-09 LAB — URINALYSIS, ROUTINE W REFLEX MICROSCOPIC
BILIRUBIN URINE: NEGATIVE
Glucose, UA: NEGATIVE mg/dL
Nitrite: NEGATIVE
PROTEIN: NEGATIVE mg/dL
Specific Gravity, Urine: 1.01 (ref 1.005–1.030)
UROBILINOGEN UA: 0.2 mg/dL (ref 0.0–1.0)
pH: 5.5 (ref 5.0–8.0)

## 2013-04-09 LAB — HEPATIC FUNCTION PANEL
ALBUMIN: 3.9 g/dL (ref 3.5–5.2)
ALT: 18 U/L (ref 0–35)
AST: 18 U/L (ref 0–37)
Alkaline Phosphatase: 62 U/L (ref 39–117)
Bilirubin, Direct: <0.2 mg/dL (ref 0.0–0.3)
TOTAL PROTEIN: 8.5 g/dL — AB (ref 6.0–8.3)
Total Bilirubin: 0.4 mg/dL (ref 0.3–1.2)

## 2013-04-09 LAB — BASIC METABOLIC PANEL
BUN: 10 mg/dL (ref 6–23)
CALCIUM: 9.4 mg/dL (ref 8.4–10.5)
CO2: 24 mEq/L (ref 19–32)
CREATININE: 0.65 mg/dL (ref 0.50–1.10)
Chloride: 103 mEq/L (ref 96–112)
Glucose, Bld: 88 mg/dL (ref 70–99)
Potassium: 4 mEq/L (ref 3.7–5.3)
Sodium: 144 mEq/L (ref 137–147)

## 2013-04-09 LAB — CULTURE, GROUP A STREP

## 2013-04-09 LAB — URINE MICROSCOPIC-ADD ON

## 2013-04-09 LAB — MONONUCLEOSIS SCREEN: MONO SCREEN: NEGATIVE

## 2013-04-09 LAB — PREGNANCY, URINE: PREG TEST UR: NEGATIVE

## 2013-04-09 MED ORDER — ONDANSETRON 4 MG PO TBDP: ORAL_TABLET | ORAL | Status: DC

## 2013-04-09 MED ORDER — ONDANSETRON 4 MG PO TBDP
4.0000 mg | ORAL_TABLET | Freq: Once | ORAL | Status: AC
  Administered 2013-04-09: 4 mg via ORAL
  Filled 2013-04-09: qty 1

## 2013-04-09 MED ORDER — CEPHALEXIN 500 MG PO CAPS: 500.0000 mg | ORAL_CAPSULE | Freq: Three times a day (TID) | ORAL | Status: DC

## 2013-04-09 MED ORDER — SODIUM CHLORIDE 0.9 % IV BOLUS (SEPSIS)
1000.0000 mL | Freq: Once | INTRAVENOUS | Status: AC
  Administered 2013-04-09: 1000 mL via INTRAVENOUS

## 2013-04-09 MED ORDER — ONDANSETRON HCL 4 MG/2ML IJ SOLN
4.0000 mg | Freq: Once | INTRAMUSCULAR | Status: AC
  Administered 2013-04-09: 4 mg via INTRAVENOUS
  Filled 2013-04-09: qty 2

## 2013-04-09 NOTE — Discharge Instructions (Signed)
Plenty of fluids.   Follow up with your md next week.

## 2013-04-09 NOTE — ED Notes (Signed)
Dx with hand foot, mouth disease Saturday. States had no vomiting then but started Saturday night. Mm moist. Pt denies diarrhea. States "feels like my kidneys are swollen". Last urinated last night and with pain. Slight gen weakness noted.

## 2013-04-09 NOTE — ED Provider Notes (Signed)
CSN: [id number]     Arrival date & time 04/09/13  1128 History  This chart was scribed for Maudry Diego, MD by Ludger Nutting, ED Scribe. This patient was seen in room APA12/APA12 and the patient's care was started 1:13 PM.    Chief Complaint  Patient presents with  . Emesis      Patient is a 23 y.o. female presenting with vomiting. The history is provided by the patient. No language interpreter was used.  Emesis Severity:  Mild Duration:  2 days Timing:  Intermittent Progression:  Unchanged Chronicity:  New Recent urination:  Decreased Context: not post-tussive and not self-induced   Relieved by:  Nothing Associated symptoms: diarrhea and sore throat   Associated symptoms: no abdominal pain and no headaches     HPI Comments: Stephanie Hayden is a 23 y.o. female who presents to the Emergency Department complaining of intermittent episodes of nausea and vomiting that began 2 days ago. She also had associated mild diarrhea and a small amount of blood in the vomit. She was seen on 04/07/13 for a sore throat, oral swelling, fever, myalgias. She was diagnosed with Hand, Foot, and Mouth disease and was prescribed magic mouthwash and hydrocodone. She states the vomiting didn't start until after her last visit.   Past Medical History  Diagnosis Date  . Endometriosis   . Urinary tract infection    Past Surgical History  Procedure Laterality Date  . Endometrial ablation    . Cesarean section     History reviewed. No pertinent family history. History  Substance Use Topics  . Smoking status: Never Smoker   . Smokeless tobacco: Not on file  . Alcohol Use: No   OB History   Grav Para Term Preterm Abortions TAB SAB Ect Mult Living                 Review of Systems  Constitutional: Negative for appetite change and fatigue.  HENT: Positive for mouth sores and sore throat. Negative for congestion, ear discharge and sinus pressure.   Eyes: Negative for discharge.  Respiratory: Negative  for cough.   Cardiovascular: Negative for chest pain.  Gastrointestinal: Positive for nausea, vomiting and diarrhea. Negative for abdominal pain.  Genitourinary: Negative for frequency and hematuria.  Musculoskeletal: Negative for back pain.  Skin: Negative for rash.  Neurological: Negative for seizures and headaches.  Psychiatric/Behavioral: Negative for hallucinations.       Allergies  Coconut flavor and Morphine and related  Home Medications  No current outpatient prescriptions on file. BP 107/73  Pulse 86  Temp(Src) 98.4 F (36.9 C) (Oral)  Resp 19  SpO2 100% Physical Exam  Nursing note and vitals reviewed. Constitutional: She is oriented to person, place, and time. She appears well-developed.  HENT:  Head: Normocephalic.  Mouth/Throat: Oral lesions present.  2 ulcers to the back of the throat and 1 on the tongue  Eyes: Conjunctivae and EOM are normal. No scleral icterus.  Neck: Neck supple. No thyromegaly present.  Cardiovascular: Normal rate, regular rhythm and normal heart sounds.  Exam reveals no gallop and no friction rub.   No murmur heard. Pulmonary/Chest: Effort normal and breath sounds normal. No stridor. No respiratory distress. She has no wheezes. She has no rales. She exhibits no tenderness.  Abdominal: Soft. She exhibits no distension. There is no tenderness. There is no rebound.  Musculoskeletal: Normal range of motion. She exhibits no edema.  Lymphadenopathy:    She has no cervical adenopathy.  Neurological:  She is oriented to person, place, and time. She exhibits normal muscle tone. Coordination normal.  Skin: No rash noted. No erythema.  Psychiatric: She has a normal mood and affect. Her behavior is normal.    ED Course  Procedures (including critical care time)  DIAGNOSTIC STUDIES: Oxygen Saturation is 100% on RA, normal by my interpretation.    COORDINATION OF CARE: 1:14 PM Discussed treatment plan with pt at bedside and pt agreed to plan.    Labs Review Labs Reviewed  URINALYSIS, ROUTINE W REFLEX MICROSCOPIC - Abnormal; Notable for the following:    Hgb urine dipstick MODERATE (*)    Ketones, ur >80 (*)    Leukocytes, UA SMALL (*)    All other components within normal limits  URINE MICROSCOPIC-ADD ON - Abnormal; Notable for the following:    Squamous Epithelial / LPF FEW (*)    Bacteria, UA FEW (*)    All other components within normal limits  CBC WITH DIFFERENTIAL  BASIC METABOLIC PANEL  PREGNANCY, URINE   Imaging Review No results found.   EKG Interpretation None      MDM   Final diagnoses:  None   The chart was scribed for me under my direct supervision.  I personally performed the history, physical, and medical decision making and all procedures in the evaluation of this patient.Maudry Diego, MD 04/09/13 [phone]

## 2014-04-23 NOTE — Op Note (Signed)
PATIENT NAME:  Stephanie Hayden, RODD MR#:  [id number] DATE OF BIRTH:  [birth year]  DATE OF PROCEDURE:  09/01/2011  PREOPERATIVE DIAGNOSES:  1. Term pregnancy.  2. Failure to progress due to cephalopelvic disproportion.   POSTOPERATIVE DIAGNOSES:  1. Term pregnancy. 2. Failure to progress due to cephalopelvic disproportion.   PROCEDURE PERFORMED:  Low transverse cesarean section.   SURGEON: Barnett Applebaum, MD  ASSISTANT: Midwife Lieutenant Diego   ANESTHESIA: Spinal.   ESTIMATED BLOOD LOSS: 250 mL.   COMPLICATIONS: None.   FINDINGS: Normal tubes, ovaries, and uterus. A viable 8 pounds, 4 ounces female with Apgar scores of 9 and 9 at one and five minutes, respectively.   DISPOSITION: To the recovery room in stable condition.   TECHNIQUE: The patient is prepped and draped in the usual sterile fashion after adequate anesthesia is attained in the supine position on the Operating Room table.  Marcaine 0.5% is used to anesthetize the skin, followed by a low transverse skin incision using a scalpel. The rectus fascia is dissected bilaterally using Mayo scissors and then separated in the midline. The peritoneum is penetrated, and the bladder is inferiorly dissected and retracted. A scalpel was used to create a low transverse hysterotomy incision that is then extended by blunt dissection. Amniotomy reveals clear fluid. The infant's head is grasped and delivered without complication or use of a vacuum device. The oropharynx is suctioned, the infant is delivered, and the umbilical cord is clamped and cut.  Cord blood is obtained, and the placenta is manually extracted. The uterus is externalized and cleansed of all membranes and debris using a moist sponge. The hysterotomy incision is closed with a running #1 Vicryl suture in a locking fashion followed by a second layer to imbricate the first layer with excellent hemostasis noted. The uterus is then placed back into the intra-abdominal cavity, and the paracolic gutters  are irrigated using warm saline. Re-examination of the incision reveals excellent hemostasis. The peritoneum is closed with a Vicryl suture. Trocars are placed through the abdomen into the subfascial space, and then catheters are fed through these trocars for placement of the On-Q Pain Pump system. The rectus fascia is closed with 0 Maxon suture. Subcutaneous tissues are irrigated and hemostasis is assured using electrocautery. Skin is closed with 4-0 Vicryl suture in a subcuticular fashion, followed by placement of Dermabond and a sterile bandage over the catheter for On-Q Pain Pump. The catheters are flushed with 5 mL each of bupivacaine. The patient goes to the recovery room in stable condition. All sponge, instrument, and needle counts are correct.  ____________________________ R. Barnett Applebaum, MD rph:cbb D: 09/10/2011 17:08:42 ET T: 09/11/2011 10:51:38 ET JOB#: [id number]  cc: Glean Salen, MD, <Dictator> Gae Dry MD ELECTRONICALLY SIGNED 09/13/2011 7:21

## 2014-05-14 NOTE — H&P (Signed)
L&D Evaluation:  History:   HPI 24 year old G1 p0 with EDC=09/02/2011 by a 6wk 1 day ultrasound presents at 29 5/7 weeks with c/o lower abdominal cramping/pain since 1600 tonight. Rates pain 7/10. Comes intermittently, about every 10 minutes. Had two soft stools in one hour after the pain began, but no diarrhea. Denies dysuria, vaginal bleeding. Has had increased vaginal discharge recently without odor or irritation. Some nausea today. +FM. Prenatal care at [facility] remarkable for pyelo in early pregancy, Nausea/vomiting    Presents with abdominal pain    Patient's Medical History Endometriosis, Interstitial cystitis    Patient's Surgical History laparoscopy x3    Medications Pre Natal Vitamins    Allergies Morphine (anaphyllaxis)    Social History none    Family History Non-Contributory   ROS:   ROS see HPI   Exam:   Vital Signs stable    Urine Protein negative dipstick, +1 leuks, 3+bacteria, 2RBC, 5WBC    General no apparent distress, breathing thru lower abdominal pains    Mental Status clear    Heart normal sinus rhythm, no murmur/gallop/rubs    Abdomen mild ctx palpated on left side of uterus with pain, uterus and abdomen otherwise NT and soft    Edema no edema    Pelvic no external lesions, cervix closed and thick, wet prep negative    Mebranes Intact    FHT 150s    Ucx palpated ctx, not picking up with toco    Skin dry   Impression:   Impression IUP at 22 5/7 weeks with possible UTI/ BH contractions from UTI   Plan:   Plan urine culture ordered. PO hydration. Start Macrobid while awaiting culture.    Comments Patient's pains seemed to get farther apat with po hydration and she desired to be discharged. Discharged home with RX for [place] and advised to rest and hydrate with water. RTN to L&D if pain worsens. RTO as scheduled and prn. Will call with urine culture results.   Electronic Signatures: Karene Fry (CNM)  (Signed 29-Apr-13  20:53)  Authored: L&D Evaluation   Last Updated: 29-Apr-13 20:53 by Karene Fry (CNM)

## 2014-05-14 NOTE — H&P (Signed)
L&D Evaluation:  History:   HPI 24 year old G1P0 with EDC=09/02/2011 by a 6wk 1 day ultrasound presents at 60 6/7 weeks with c/o lower abdominal cramping/pain since 1600 last night. Rated pain 7/10. Came intermittently, about every 10 minutes. Had two soft stools in one hour after the pain began, but no diarrhea. Denies dysuria, vaginal bleeding. Has had increased vaginal discharge recently without odor or irritation. Some nausea yesterday. +FM. Prenatal care at [facility] remarkable for pyelo in early pregancy and nausea/vomiting.  She was treated for n/v yesterday and sent home with Macrobid for UTI.  She went to [facility] earlier today for n/v/diarrhea/abdominal pain.  She was given IVF, Zofran and fentanyl and had a kidney uls which showed "her right ureter was twisted".  She was discharged home.  She returns with significant lower abd cramping that feels like contractions.  She is currently having sudden onset nausea, vomiting and diarrhea. She denies sick contacts or possibility of food poisoning.  Abd uls was normal except for mild fullness of the right renal collecting system.  UA was neg except for 5-10 WBCs and epith cells and moderate bacteria.  Drug screen was neg.    Presents with abdominal pain, nausea/vomiting, diarrhea    Patient's Medical History Endometriosis, Interstitial cystitis    Patient's Surgical History laparoscopy x3    Medications Pre Natal Vitamins    Allergies Morphine (anaphylaxis)    Social History none    Family History Non-Contributory   ROS:   ROS see HPI   Exam:   Vital Signs stable    General no apparent distress, appears significantly uncomfortable with lower abdominal pains    Mental Status clear    Chest clear    Heart normal sinus rhythm, no murmur/gallop/rubs    Abdomen gravid, tender with palpation    Edema no edema    Pelvic no external lesions, cervix closed and thick, wet prep negative yesterday    Mebranes Intact    FHT 145     FHT Description Variable decelerations, Reassuring for gestational age    Ucx absent    Skin dry    Other K = 3.3, CMP otherwise unremarkable   Impression:   Impression IUP at 22 6/7 weeks with probable viral gastroenteritis given sudden onset   Plan:    Comments 1)  Since pt is vomiting and is allergic to morphine but has had Fentanyl without problems, will give 48mcg q hr prn pain. 2)  Imodium 2mg  now and 2mg  with each loose stool to max of 8mg . 3)  Phenergan and/or Zofran for vomiting. 4)  K supplementation in IVF.   Electronic Signatures: Donzetta Matters (MD)  (Signed 01-May-13 00:23)  Authored: L&D Evaluation   Last Updated: 01-May-13 00:23 by Donzetta Matters (MD)

## 2014-09-18 ENCOUNTER — Other Ambulatory Visit (HOSPITAL_COMMUNITY): Payer: Self-pay | Admitting: Internal Medicine

## 2014-09-18 ENCOUNTER — Ambulatory Visit (HOSPITAL_COMMUNITY)
Admission: RE | Admit: 2014-09-18 | Discharge: 2014-09-18 | Disposition: A | Discharge status: Home / Self Care | Payer: BLUE CROSS/BLUE SHIELD | Source: Ambulatory Visit | Attending: Internal Medicine | Admitting: Internal Medicine

## 2014-09-18 DIAGNOSIS — R112 Nausea with vomiting, unspecified: Secondary | ICD-10-CM | POA: Diagnosis not present

## 2014-09-18 DIAGNOSIS — R1031 Right lower quadrant pain: Secondary | ICD-10-CM | POA: Diagnosis not present

## 2014-09-18 MED ORDER — IOHEXOL 300 MG/ML  SOLN
100.0000 mL | Freq: Once | INTRAMUSCULAR | Status: AC | PRN
  Administered 2014-09-18: 100 mL via INTRAVENOUS

## 2014-09-18 MED ORDER — BARIUM SULFATE 2.1 % PO SUSP
ORAL | Status: AC
  Filled 2014-09-18: qty 2

## 2014-09-30 ENCOUNTER — Other Ambulatory Visit (HOSPITAL_COMMUNITY): Payer: Self-pay | Admitting: Internal Medicine

## 2014-09-30 DIAGNOSIS — R1084 Generalized abdominal pain: Secondary | ICD-10-CM

## 2014-10-04 ENCOUNTER — Ambulatory Visit (HOSPITAL_COMMUNITY)
Admission: RE | Admit: 2014-10-04 | Payer: BLUE CROSS/BLUE SHIELD | Source: Ambulatory Visit | Admitting: Internal Medicine

## 2015-01-05 HISTORY — PX: ABLATION ON ENDOMETRIOSIS: SHX5787

## 2015-04-05 HISTORY — PX: WISDOM TOOTH EXTRACTION: SHX21

## 2015-05-30 ENCOUNTER — Other Ambulatory Visit: Payer: BLUE CROSS/BLUE SHIELD

## 2015-06-03 ENCOUNTER — Inpatient Hospital Stay: Admission: RE | Admit: 2015-06-03 | Payer: BLUE CROSS/BLUE SHIELD | Source: Ambulatory Visit

## 2015-06-03 ENCOUNTER — Encounter: Payer: Self-pay | Admitting: *Deleted

## 2015-06-03 NOTE — Patient Instructions (Signed)
  Your procedure is scheduled on: 06-06-15 (FRIDAY) Report to [place] To find out your arrival time please call [phone] between 1PM - 3PM on 06-05-15 (THURSDAY)  Remember: Instructions that are not followed completely may result in serious medical risk, up to and including death, or upon the discretion of your surgeon and anesthesiologist your surgery may need to be rescheduled.    _X___ 1. Do not eat food or drink liquids after midnight. No gum chewing or hard candies.     _X___ 2. No Alcohol for 24 hours before or after surgery.   ____ 3. Bring all medications with you on the day of surgery if instructed.    _X___ 4. Notify your doctor if there is any change in your medical condition     (cold, fever, infections).     Do not wear jewelry, make-up, hairpins, clips or nail polish.  Do not wear lotions, powders, or perfumes. You may wear deodorant.  Do not shave 48 hours prior to surgery. Men may shave face and neck.  Do not bring valuables to the hospital.    [facility] is not responsible for any belongings or valuables.               Contacts, dentures or bridgework may not be worn into surgery.  Leave your suitcase in the car. After surgery it may be brought to your room.  For patients admitted to the hospital, discharge time is determined by your treatment team.   Patients discharged the day of surgery will not be allowed to drive home.   Please read over the following fact sheets that you were given:     ____ Take these medicines the morning of surgery with A SIP OF WATER:    1. NONE  2.   3.   4.  5.  6.  ____ Fleet Enema (as directed)   _X___ Use CHG Soap as directed  ____ Use inhalers on the day of surgery  ____ Stop metformin 2 days prior to surgery    ____ Take 1/2 of usual insulin dose the night before surgery and none on the morning of surgery.   ____ Stop Coumadin/Plavix/aspirin-N/A  _X___ Stop Anti-inflammatories-NO  NSAIDS OR ASA PRODUCTS-TYLENOL OK TO TAKE   ____ Stop supplements until after surgery.    ____ Bring C-Pap to the hospital.

## 2015-06-04 ENCOUNTER — Encounter
Admission: RE | Admit: 2015-06-04 | Discharge: 2015-06-04 | Disposition: A | Discharge status: Home / Self Care | Payer: BLUE CROSS/BLUE SHIELD | Source: Ambulatory Visit | Attending: Obstetrics & Gynecology | Admitting: Obstetrics & Gynecology

## 2015-06-04 DIAGNOSIS — N8312 Corpus luteum cyst of left ovary: Secondary | ICD-10-CM | POA: Diagnosis not present

## 2015-06-04 DIAGNOSIS — Z885 Allergy status to narcotic agent status: Secondary | ICD-10-CM | POA: Diagnosis not present

## 2015-06-04 DIAGNOSIS — Z8249 Family history of ischemic heart disease and other diseases of the circulatory system: Secondary | ICD-10-CM | POA: Diagnosis not present

## 2015-06-04 DIAGNOSIS — Z801 Family history of malignant neoplasm of trachea, bronchus and lung: Secondary | ICD-10-CM | POA: Diagnosis not present

## 2015-06-04 DIAGNOSIS — N803 Endometriosis of pelvic peritoneum: Secondary | ICD-10-CM | POA: Diagnosis not present

## 2015-06-04 DIAGNOSIS — N3010 Interstitial cystitis (chronic) without hematuria: Secondary | ICD-10-CM | POA: Diagnosis not present

## 2015-06-04 DIAGNOSIS — Z833 Family history of diabetes mellitus: Secondary | ICD-10-CM | POA: Diagnosis not present

## 2015-06-04 DIAGNOSIS — G8929 Other chronic pain: Secondary | ICD-10-CM | POA: Diagnosis present

## 2015-06-04 LAB — CBC
HCT: 40.7 % (ref 35.0–47.0)
Hemoglobin: 13.7 g/dL (ref 12.0–16.0)
MCH: 29.9 pg (ref 26.0–34.0)
MCHC: 33.7 g/dL (ref 32.0–36.0)
MCV: 88.7 fL (ref 80.0–100.0)
PLATELETS: 274 10*3/uL (ref 150–440)
RBC: 4.59 MIL/uL (ref 3.80–5.20)
RDW: 13.2 % (ref 11.5–14.5)
WBC: 7.1 10*3/uL (ref 3.6–11.0)

## 2015-06-04 LAB — TYPE AND SCREEN
ABO/RH(D): A POS
Antibody Screen: NEGATIVE

## 2015-06-05 ENCOUNTER — Encounter: Payer: Self-pay | Admitting: *Deleted

## 2015-06-05 LAB — ABO/RH: ABO/RH(D): A POS

## 2015-06-06 ENCOUNTER — Encounter: Payer: Self-pay | Admitting: *Deleted

## 2015-06-06 ENCOUNTER — Ambulatory Visit: Payer: BLUE CROSS/BLUE SHIELD | Admitting: Anesthesiology

## 2015-06-06 ENCOUNTER — Ambulatory Visit
Admission: RE | Admit: 2015-06-06 | Discharge: 2015-06-06 | Disposition: A | Discharge status: Home / Self Care | Payer: BLUE CROSS/BLUE SHIELD | Source: Ambulatory Visit | Attending: Obstetrics & Gynecology | Admitting: Obstetrics & Gynecology

## 2015-06-06 ENCOUNTER — Encounter
Admission: RE | Disposition: A | Discharge status: Home / Self Care | Payer: Self-pay | Source: Ambulatory Visit | Attending: Obstetrics & Gynecology

## 2015-06-06 DIAGNOSIS — Z801 Family history of malignant neoplasm of trachea, bronchus and lung: Secondary | ICD-10-CM | POA: Insufficient documentation

## 2015-06-06 DIAGNOSIS — Z885 Allergy status to narcotic agent status: Secondary | ICD-10-CM | POA: Insufficient documentation

## 2015-06-06 DIAGNOSIS — N803 Endometriosis of pelvic peritoneum: Secondary | ICD-10-CM | POA: Insufficient documentation

## 2015-06-06 DIAGNOSIS — G8929 Other chronic pain: Secondary | ICD-10-CM | POA: Insufficient documentation

## 2015-06-06 DIAGNOSIS — N8312 Corpus luteum cyst of left ovary: Secondary | ICD-10-CM | POA: Insufficient documentation

## 2015-06-06 DIAGNOSIS — Z833 Family history of diabetes mellitus: Secondary | ICD-10-CM | POA: Insufficient documentation

## 2015-06-06 DIAGNOSIS — N3010 Interstitial cystitis (chronic) without hematuria: Secondary | ICD-10-CM | POA: Insufficient documentation

## 2015-06-06 DIAGNOSIS — Z8249 Family history of ischemic heart disease and other diseases of the circulatory system: Secondary | ICD-10-CM | POA: Insufficient documentation

## 2015-06-06 DIAGNOSIS — N809 Endometriosis, unspecified: Secondary | ICD-10-CM | POA: Diagnosis present

## 2015-06-06 HISTORY — DX: Vascular complications following infusion, transfusion and therapeutic injection, initial encounter: T80.1XXA

## 2015-06-06 HISTORY — PX: LAPAROSCOPY: SHX197

## 2015-06-06 HISTORY — DX: Phlebitis and thrombophlebitis of unspecified site: I80.9

## 2015-06-06 HISTORY — PX: CHROMOPERTUBATION: SHX6288

## 2015-06-06 LAB — POCT PREGNANCY, URINE: Preg Test, Ur: NEGATIVE

## 2015-06-06 SURGERY — LAPAROSCOPY OPERATIVE
Anesthesia: General

## 2015-06-06 MED ORDER — NEOSTIGMINE METHYLSULFATE 10 MG/10ML IV SOLN
INTRAVENOUS | Status: DC | PRN
  Administered 2015-06-06: 3 mg via INTRAVENOUS

## 2015-06-06 MED ORDER — HYDROMORPHONE HCL 1 MG/ML IJ SOLN
INTRAMUSCULAR | Status: DC | PRN
  Administered 2015-06-06: 0.5 mg via INTRAVENOUS

## 2015-06-06 MED ORDER — FENTANYL CITRATE (PF) 100 MCG/2ML IJ SOLN
25.0000 ug | INTRAMUSCULAR | Status: DC | PRN
  Administered 2015-06-06: 50 ug via INTRAVENOUS
  Administered 2015-06-06 (×2): 25 ug via INTRAVENOUS
  Administered 2015-06-06: 50 ug via INTRAVENOUS

## 2015-06-06 MED ORDER — BUPIVACAINE HCL (PF) 0.5 % IJ SOLN
INTRAMUSCULAR | Status: AC
  Filled 2015-06-06: qty 30

## 2015-06-06 MED ORDER — ROCURONIUM BROMIDE 100 MG/10ML IV SOLN
INTRAVENOUS | Status: DC | PRN
  Administered 2015-06-06: 40 mg via INTRAVENOUS

## 2015-06-06 MED ORDER — ONDANSETRON HCL 4 MG/2ML IJ SOLN
INTRAMUSCULAR | Status: AC
  Filled 2015-06-06: qty 2

## 2015-06-06 MED ORDER — OXYCODONE-ACETAMINOPHEN 5-325 MG PO TABS: 1.0000 | ORAL_TABLET | ORAL | Status: DC | PRN

## 2015-06-06 MED ORDER — MIDAZOLAM HCL 2 MG/2ML IJ SOLN
INTRAMUSCULAR | Status: DC | PRN
  Administered 2015-06-06: 2 mg via INTRAVENOUS

## 2015-06-06 MED ORDER — METHYLENE BLUE 0.5 % INJ SOLN
INTRAVENOUS | Status: AC
Start: 2015-06-06 — End: 2015-06-06
  Filled 2015-06-06: qty 10

## 2015-06-06 MED ORDER — FENTANYL CITRATE (PF) 100 MCG/2ML IJ SOLN
INTRAMUSCULAR | Status: AC
  Filled 2015-06-06: qty 2

## 2015-06-06 MED ORDER — ACETAMINOPHEN 325 MG PO TABS: 650.0000 mg | ORAL_TABLET | ORAL | Status: DC | PRN

## 2015-06-06 MED ORDER — HYDROMORPHONE HCL 1 MG/ML IJ SOLN
INTRAMUSCULAR | Status: AC
  Filled 2015-06-06: qty 1

## 2015-06-06 MED ORDER — DEXAMETHASONE SODIUM PHOSPHATE 10 MG/ML IJ SOLN
INTRAMUSCULAR | Status: DC | PRN
  Administered 2015-06-06: 5 mg via INTRAVENOUS

## 2015-06-06 MED ORDER — LIDOCAINE HCL (CARDIAC) 20 MG/ML IV SOLN
INTRAVENOUS | Status: DC | PRN
  Administered 2015-06-06: 100 mg via INTRAVENOUS

## 2015-06-06 MED ORDER — FAMOTIDINE 20 MG PO TABS
ORAL_TABLET | ORAL | Status: AC
  Administered 2015-06-06: 20 mg via ORAL
  Filled 2015-06-06: qty 1

## 2015-06-06 MED ORDER — DIPHENHYDRAMINE HCL 50 MG/ML IJ SOLN
INTRAMUSCULAR | Status: AC
  Filled 2015-06-06: qty 1

## 2015-06-06 MED ORDER — PROPOFOL 10 MG/ML IV BOLUS
INTRAVENOUS | Status: DC | PRN
  Administered 2015-06-06: 150 mg via INTRAVENOUS

## 2015-06-06 MED ORDER — FAMOTIDINE 20 MG PO TABS
20.0000 mg | ORAL_TABLET | Freq: Once | ORAL | Status: AC
Start: 2015-06-06 — End: 2015-06-06
  Administered 2015-06-06: 20 mg via ORAL

## 2015-06-06 MED ORDER — DIPHENHYDRAMINE HCL 50 MG/ML IJ SOLN
12.5000 mg | Freq: Once | INTRAMUSCULAR | Status: AC
  Administered 2015-06-06: 12.5 mg via INTRAVENOUS

## 2015-06-06 MED ORDER — SCOPOLAMINE 1 MG/3DAYS TD PT72
1.0000 | MEDICATED_PATCH | Freq: Once | TRANSDERMAL | Status: DC
  Administered 2015-06-06: 1.5 mg via TRANSDERMAL

## 2015-06-06 MED ORDER — OXYCODONE-ACETAMINOPHEN 5-325 MG PO TABS
1.0000 | ORAL_TABLET | ORAL | Status: DC | PRN
  Administered 2015-06-06: 1 via ORAL

## 2015-06-06 MED ORDER — GLYCOPYRROLATE 0.2 MG/ML IJ SOLN
INTRAMUSCULAR | Status: DC | PRN
  Administered 2015-06-06: .5 mg via INTRAVENOUS

## 2015-06-06 MED ORDER — HYDROMORPHONE HCL PF 1 MG/ML IJ SOLN: 1.0000 mg | INTRAMUSCULAR | Status: DC | PRN

## 2015-06-06 MED ORDER — HYDROMORPHONE HCL 1 MG/ML IJ SOLN
0.2500 mg | INTRAMUSCULAR | Status: DC | PRN
  Administered 2015-06-06 (×2): 0.5 mg via INTRAVENOUS

## 2015-06-06 MED ORDER — BUPIVACAINE HCL (PF) 0.5 % IJ SOLN
INTRAMUSCULAR | Status: DC | PRN
  Administered 2015-06-06: 10 mL

## 2015-06-06 MED ORDER — OXYCODONE-ACETAMINOPHEN 5-325 MG PO TABS
ORAL_TABLET | ORAL | Status: AC
  Filled 2015-06-06: qty 1

## 2015-06-06 MED ORDER — ONDANSETRON HCL 4 MG/2ML IJ SOLN
4.0000 mg | Freq: Once | INTRAMUSCULAR | Status: AC | PRN
  Administered 2015-06-06: 4 mg via INTRAVENOUS

## 2015-06-06 MED ORDER — KETOROLAC TROMETHAMINE 30 MG/ML IJ SOLN
INTRAMUSCULAR | Status: DC | PRN
Start: 2015-06-06 — End: 2015-06-06
  Administered 2015-06-06: 30 mg via INTRAVENOUS

## 2015-06-06 MED ORDER — SCOPOLAMINE 1 MG/3DAYS TD PT72
MEDICATED_PATCH | TRANSDERMAL | Status: AC
  Administered 2015-06-06: 1.5 mg via TRANSDERMAL
  Filled 2015-06-06: qty 1

## 2015-06-06 MED ORDER — LACTATED RINGERS IV SOLN
INTRAVENOUS | Status: DC
  Administered 2015-06-06 (×4): via INTRAVENOUS

## 2015-06-06 MED ORDER — METHYLENE BLUE 0.5 % INJ SOLN
INTRAVENOUS | Status: DC | PRN
Start: 2015-06-06 — End: 2015-06-06
  Administered 2015-06-06: 5 mL

## 2015-06-06 MED ORDER — KETOROLAC TROMETHAMINE 30 MG/ML IJ SOLN: 30.0000 mg | Freq: Four times a day (QID) | INTRAMUSCULAR | Status: DC

## 2015-06-06 MED ORDER — FENTANYL CITRATE (PF) 100 MCG/2ML IJ SOLN
INTRAMUSCULAR | Status: DC | PRN
  Administered 2015-06-06: 100 ug via INTRAVENOUS

## 2015-06-06 MED ORDER — ACETAMINOPHEN 650 MG RE SUPP: 650.0000 mg | RECTAL | Status: DC | PRN

## 2015-06-06 MED ORDER — DIPHENHYDRAMINE HCL 50 MG/ML IJ SOLN: 25.0000 mg | Freq: Once | INTRAMUSCULAR | Status: DC

## 2015-06-06 SURGICAL SUPPLY — 35 items
BLADE SURG SZ11 CARB STEEL (BLADE) ×3 IMPLANT
CANISTER SUCT 1200ML W/VALVE (MISCELLANEOUS) ×3 IMPLANT
CATH ROBINSON RED A/P 16FR (CATHETERS) ×3 IMPLANT
CHLORAPREP W/TINT 26ML (MISCELLANEOUS) ×3 IMPLANT
DRESSING TELFA 4X3 1S ST N-ADH (GAUZE/BANDAGES/DRESSINGS) IMPLANT
ENDOPOUCH RETRIEVER 10 (MISCELLANEOUS) IMPLANT
GAUZE SPONGE NON-WVN 2X2 STRL (MISCELLANEOUS) IMPLANT
GLOVE BIO SURGEON STRL SZ8 (GLOVE) ×3 IMPLANT
GLOVE INDICATOR 8.0 STRL GRN (GLOVE) ×3 IMPLANT
GOWN STRL REUS W/ TWL LRG LVL3 (GOWN DISPOSABLE) ×1 IMPLANT
GOWN STRL REUS W/ TWL XL LVL3 (GOWN DISPOSABLE) ×1 IMPLANT
GOWN STRL REUS W/TWL LRG LVL3 (GOWN DISPOSABLE) ×6
GOWN STRL REUS W/TWL XL LVL3 (GOWN DISPOSABLE) ×6
IRRIGATION STRYKERFLOW (MISCELLANEOUS) IMPLANT
IRRIGATOR STRYKERFLOW (MISCELLANEOUS) ×3
IV LACTATED RINGERS 1000ML (IV SOLUTION) ×2 IMPLANT
KIT PINK PAD W/HEAD ARE REST (MISCELLANEOUS) ×3
KIT PINK PAD W/HEAD ARM REST (MISCELLANEOUS) ×1 IMPLANT
LABEL OR SOLS (LABEL) ×3 IMPLANT
LIQUID BAND (GAUZE/BANDAGES/DRESSINGS) ×3 IMPLANT
NEEDLE VERESS 14GA 120MM (NEEDLE) ×3 IMPLANT
NS IRRIG 500ML POUR BTL (IV SOLUTION) ×3 IMPLANT
PACK GYN LAPAROSCOPIC (MISCELLANEOUS) ×3 IMPLANT
PAD PREP 24X41 OB/GYN DISP (PERSONAL CARE ITEMS) ×3 IMPLANT
SCISSORS METZENBAUM CVD 33 (INSTRUMENTS) ×3 IMPLANT
SHEARS HARMONIC ACE PLUS 36CM (ENDOMECHANICALS) ×2 IMPLANT
SLEEVE ENDOPATH XCEL 5M (ENDOMECHANICALS) IMPLANT
SPONGE VERSALON 2X2 STRL (MISCELLANEOUS)
STRAP SAFETY BODY (MISCELLANEOUS) ×3 IMPLANT
SUT VIC AB 2-0 UR6 27 (SUTURE) ×2 IMPLANT
SUT VIC AB 4-0 PS2 18 (SUTURE) IMPLANT
SYRINGE 10CC LL (SYRINGE) ×3 IMPLANT
TROCAR ENDO BLADELESS 11MM (ENDOMECHANICALS) IMPLANT
TROCAR XCEL NON-BLD 5MMX100MML (ENDOMECHANICALS) ×3 IMPLANT
TUBING INSUFFLATOR HI FLOW (MISCELLANEOUS) ×3 IMPLANT

## 2015-06-06 NOTE — Anesthesia Postprocedure Evaluation (Signed)
Anesthesia Post Note  Patient: Stephanie Hayden  Procedure(s) Performed: Procedure(s) (LRB): LAPAROSCOPY OPERATIVE/ EXCISION OF ENDOMETRIOSIS (N/A) LAPAROSCOPIC OOPHORECTOMY (N/A) CHROMOPERTUBATION (N/A)  Patient location during evaluation: PACU Anesthesia Type: General Level of consciousness: awake Pain management: satisfactory to patient Vital Signs Assessment: post-procedure vital signs reviewed and stable Respiratory status: nonlabored ventilation Cardiovascular status: blood pressure returned to baseline Anesthetic complications: no    Last Vitals:  Filed Vitals:   06/06/15 1211 06/06/15 1217  BP:  103/71  Pulse: 56 57  Temp:    Resp: 10 16    Last Pain:  Filed Vitals:   06/06/15 1221  PainSc: 6                  VAN STAVEREN,Kyara Boxer

## 2015-06-06 NOTE — Progress Notes (Signed)
Patient ambulated to bathroom with assistance and voided in the toilet. Patient dressed and ready to be discharged.

## 2015-06-06 NOTE — Op Note (Signed)
  Operative Note   06/06/2015  PRE-OP DIAGNOSIS: Endometriosis, Pelvic Pain   POST-OP DIAGNOSIS: same   PROCEDURE: Procedure(s): LAPAROSCOPY OPERATIVE/ EXCISION OF ENDOMETRIOSIS LEFT LAPAROSCOPIC OOPHORECTOMY CHROMOPERTUBATION  CYSTOSCOPY  SURGEON: Barnett Applebaum, MD, FACOG  ANESTHESIA: Choice   ESTIMATED BLOOD LOSS: [id number] mL  COMPLICATIONS: None  DISPOSITION: PACU - hemodynamically stable.  CONDITION: stable  FINDINGS: Laparoscopic survey of the abdomen revealed a grossly normal uterus, tubes, ovaries, liver edge, gallbladder edge and appendix, No intra-abdominal adhesions were noted. Areas of endometriosis implants noted (and excised) along the posterior peritoneum along the uterosacral ligaments. Left ovary mildly enlarged.  Cystoscopy revealed patent ureters bilaterally and no evidence for interstitial cystitis.  PROCEDURE IN DETAIL: The patient was taken to the OR where anesthesia was administed. The patient was positioned in dorsal lithotomy in the [place]. The patient was then examined under anesthesia with the above noted findings. The patient was prepped and draped in the normal sterile fashion and foley catheter was placed. A Graves speculum was placed in the vagina and the anterior lip of the cervix was grasped with a single toothed tenaculum. A uterine manipulator was then inserted in the uterus. Uterine mobility was found to be satisfactory. The speculum was then removed.  Attention was turned to the patient's abdomen where a 5 mm skin incision was made in the umbilical fold, after injection of local anesthesia. The Veress step needle was carefully introduced into the peritoneal cavity with placement confirmed using the hanging drop technique.  Pneumoperitoneum was obtained. The 5 mm port was then placed under direct visualization with the operative laparoscope.  Trendelenburg positioning.  Additional 38mm trocar was then placed in the RLQ lateral to the inferior  epigastric blood vessels under direct visualization with the laparoscope.  Instrumentation to visualize complete pelvic anatomy performed.  A 57mm trocar was also then placed in the suprapubic region.    Areas of endometriosis implants are identified along each uterosacral ligament as well as beneath the left ovary.  These areas are carefully grasped and tented up with excision using the harmonic scapel, with careful dissection to avoid vital structures.  Hemostasis visible.  No apparent injury to ureter or blood vessels seen.  The left ovary is identified and stabilized.  Dissection across the utero-ovarian and ovarian blood vessels is performed with preservation of fallopian tube and its blood flow.  Ovary is completely dissected free and removed.  Hemostasis is visualized and assured.  Contralateral ovary seen as normal.  Pelvic cavity is cleaned with any fluid aspirated.  Methylene blue dye is injected through the uterine manipulator and seen to exit each fallopian tube (first right, then left with some mucus plugging visualized on left but free flow at this point).  Instruments and trocars removed, gas expelled, and skin closed with skin adhesive glue.  Instrument, needle, and sponge counts correct x2 at the conclusion of the case.    Cystoscopy is performed with saline distension of the bladder and adequate visualization of ureter stream from each orifice.  No glomerulations or ulcerations visualized along bladder wall.  No injury.  Fluid id drained out of bladder.    Pt goes to recovery room in stable condition.

## 2015-06-06 NOTE — Transfer of Care (Signed)
Immediate Anesthesia Transfer of Care Note  Patient: Stephanie Hayden  Procedure(s) Performed: Procedure(s): LAPAROSCOPY OPERATIVE/ EXCISION OF ENDOMETRIOSIS (N/A) LAPAROSCOPIC OOPHORECTOMY (N/A) CHROMOPERTUBATION (N/A)  Patient Location: PACU  Anesthesia Type:General  Level of Consciousness: sedated  Airway & Oxygen Therapy: Patient Spontanous Breathing and Patient connected to face mask oxygen  Post-op Assessment: Report given to RN and Post -op Vital signs reviewed and stable  Post vital signs: Reviewed and stable  Last Vitals:  Filed Vitals:   06/06/15 0816  Pulse: 71  Temp: 36.7 C  Resp: 65    Last Pain:  Filed Vitals:   06/06/15 0842  PainSc: 2          Complications: No apparent anesthesia complications

## 2015-06-06 NOTE — Discharge Instructions (Signed)
Endometriosis Endometriosis is a condition in which the tissue that lines the uterus (endometrium) grows outside of its normal location. The tissue may grow in many locations close to the uterus, but it commonly grows on the ovaries, fallopian tubes, vagina, or bowel. Because the uterus expels, or sheds, its lining every menstrual cycle, there is bleeding wherever the endometrial tissue is located. This can cause pain because blood is irritating to tissues not normally exposed to it.  CAUSES  The cause of endometriosis is not known.  SIGNS AND SYMPTOMS  Often, there are no symptoms. When symptoms are present, they can vary with the location of the displaced tissue. Various symptoms can occur at different times. Although symptoms occur mainly during a woman's menstrual period, they can also occur midcycle and usually stop with menopause. Some people may go months with no symptoms at all. Symptoms may include:   Back or abdominal pain.   Heavier bleeding during periods.   Pain during intercourse.   Painful bowel movements.   Infertility. DIAGNOSIS  Your health care provider will do a physical exam and ask about your symptoms. Various tests may be done, such as:   Blood tests and urine tests. These are done to help rule out other problems.   Ultrasound. This test is done to look for abnormal tissue.   An X-ray of the lower bowel (barium enema).  Laparoscopy. In this procedure, a thin, lighted tube with a tiny camera on the end (laparoscope) is inserted into your abdomen. This helps your health care provider look for abnormal tissue to confirm the diagnosis. The health care provider may also remove a small piece of tissue (biopsy) from any abnormal tissue found. This tissue sample can then be sent to a lab so it can be looked at under a microscope. TREATMENT  Treatment will vary and may include:   Medicines to relieve pain. Nonsteroidal anti-inflammatory drugs (NSAIDs) are a type of  pain medicine that can help to relieve the pain caused by endometriosis.  Hormonal therapy. When using hormonal therapy, periods are eliminated. This eliminates the monthly exposure to blood by the displaced endometrial tissue.   Surgery. Surgery may sometimes be done to remove the abnormal endometrial tissue. In severe cases, surgery may be done to remove the fallopian tubes, uterus, and ovaries (hysterectomy). HOME CARE INSTRUCTIONS   Take all medicines as directed by your health care provider. Do not take aspirin because it may increase bleeding when you are not on hormonal therapy.   Avoid activities that produce pain, including sexual activity. SEEK MEDICAL CARE IF:  You have pelvic pain before, after, or during your periods.  You have pelvic pain between periods that gets worse during your period.  You have pelvic pain during or after sex.  You have pelvic pain with bowel movements or urination, especially during your period.  You have problems getting pregnant.  You have a fever. SEEK IMMEDIATE MEDICAL CARE IF:   Your pain is severe and is not responding to pain medicine.   You have severe nausea and vomiting, or you cannot keep foods down.   You have pain that is limited to the right lower part of your abdomen.   You have swelling or increasing pain in your abdomen.   You see blood in your stool.  MAKE SURE YOU:   Understand these instructions.  Will watch your condition.  Will get help right away if you are not doing well or get worse.   This information  is not intended to replace advice given to you by your health care provider. Make sure you discuss any questions you have with your health care provider.   Document Released: 12/19/1999 Document Revised: 01/11/2014 Document Reviewed: 08/18/2012 Elsevier Interactive Patient Education 2016 [facility] Post-Operative Instructions You may expect to feel dizzy, weak, and drowsy  for as long as 24 hours after receiving the medicine that made you sleep (anesthetic).  Do not drive a car, ride a bicycle, participate in physical activities, or take public transportation until you are done taking narcotic pain medicines or as directed by your doctor.  Do not drink alcohol or take tranquilizers.  Do not take medicine that has not been prescribed by your doctor.  Do not sign important papers or make important decisions while on narcotic pain medicines.  Have a responsible person with you.  CARE OF INCISION  Keep incision clean and dry. Take showers instead of baths until your doctor gives you permission to take baths.  Avoid heavy lifting (more than 10 pounds/4.5 kilograms), pushing, or pulling.  Avoid activities that may risk injury to your surgical site.  No sexual intercourse or placement of anything in the vagina for 2 weeks or as instructed by your doctor. REMOVE BANDAGES SUNDAY Only take prescription or over-the-counter medicines  for pain, discomfort, or fever as directed by your doctor. Do not take aspirin. It can make you bleed. Take medicines (antibiotics) that kill germs if they are prescribed for you.  Call the office or go to the ER if:  You feel sick to your stomach (nauseous) and you start to throw up (vomit).  You have trouble eating or drinking.  You have an oral temperature above 101.  You have constipation that is not helped by adjusting diet or increasing fluid intake. Pain medicines are a common cause of constipation.  You have any other concerns. SEEK IMMEDIATE MEDICAL CARE IF:  You have persistent dizziness.  You have difficulty breathing or a congested sounding (croupy) cough.  You have an oral temperature above 102.5, not controlled by medicine.  There is increasing pain or tenderness near or in the surgical site.   AMBULATORY SURGERY  DISCHARGE INSTRUCTIONS   1) The drugs that you were given will stay in your system until tomorrow so for the  next 24 hours you should not:  A) Drive an automobile B) Make any legal decisions C) Drink any alcoholic beverage   2) You may resume regular meals tomorrow.  Today it is better to start with liquids and gradually work up to solid foods.  You may eat anything you prefer, but it is better to start with liquids, then soup and crackers, and gradually work up to solid foods.   3) Please notify your doctor immediately if you have any unusual bleeding, trouble breathing, redness and pain at the surgery site, drainage, fever, or pain not relieved by medication.   Please contact your physician with any problems or Same Day Surgery at [phone], Monday through Friday 6 am to 4 pm, or [place] at [facility] number at [phone].

## 2015-06-06 NOTE — H&P (Signed)
History and Physical Interval Note:  06/06/2015 9:05 AM  Stephanie Hayden  has presented today for surgery, with the diagnosis of CHRONIC PELVIC PAIN, KNOWN ENDOMETRIOSIS, DESIRE FOR FERTILITY. The various methods of treatment have been discussed with the patient and family. After consideration of risks, benefits and other options for treatment, the patient has consented to  Procedure(s): LAPAROSCOPY OPERATIVE/ EXCISION OF ENDOMETRIOSIS LEFT LAPAROSCOPIC OOPHORECTOMY CHROMOPERTUBATION as a surgical intervention .  The patient's history has been reviewed, patient examined, no change in status, stable for surgery.  Pt has the following beta blocker history-  Not taking Beta Blocker.  I have reviewed the patient's chart and labs.  Questions were answered to the patient's satisfaction.    Hoyt Koch

## 2015-06-06 NOTE — Anesthesia Procedure Notes (Signed)
Procedure Name: Intubation Date/Time: 06/06/2015 10:00 AM Performed by: [facility] Pre-anesthesia Checklist: Patient identified, Emergency Drugs available, Suction available, Patient being monitored and Timeout performed Patient Re-evaluated:Patient Re-evaluated prior to inductionOxygen Delivery Method: Circle system utilized Preoxygenation: Pre-oxygenation with 100% oxygen Intubation Type: IV induction Ventilation: Mask ventilation without difficulty Laryngoscope Size: Mac and 3 Grade View: Grade I Tube type: Oral Tube size: 7.0 mm Number of attempts: 1 Airway Equipment and Method: Patient positioned with wedge pillow and Stylet Placement Confirmation: ETT inserted through vocal cords under direct vision,  positive ETCO2 and breath sounds checked- equal and bilateral Secured at: 21 cm Tube secured with: Tape Dental Injury: Teeth and Oropharynx as per pre-operative assessment

## 2015-06-06 NOTE — Progress Notes (Signed)
Up to bathroom and unable to void. Bladder scanned for 264 ml. Dr. Kenton Kingfisher aware and talked with patient. Will continue to monitor and wait until she is able to void prior to discharge.

## 2015-06-06 NOTE — Anesthesia Preprocedure Evaluation (Addendum)
Anesthesia Evaluation  Patient identified by MRN, date of birth, ID band Patient awake    Reviewed: Allergy & Precautions, H&P , NPO status , Patient's Chart, lab work & pertinent test results, reviewed documented beta blocker date and time   History of Anesthesia Complications (+) PONV and history of anesthetic complications  Airway Mallampati: I  TM Distance: >3 FB Neck ROM: full    Dental no notable dental hx. (+) Teeth Intact   Pulmonary neg pulmonary ROS,    Pulmonary exam normal breath sounds clear to auscultation       Cardiovascular Exercise Tolerance: Good negative cardio ROS Normal cardiovascular exam Rhythm:regular Rate:Normal     Neuro/Psych negative neurological ROS  negative psych ROS   GI/Hepatic negative GI ROS, Neg liver ROS,   Endo/Other  negative endocrine ROS  Renal/GU negative Renal ROS  negative genitourinary   Musculoskeletal   Abdominal   Peds  Hematology negative hematology ROS (+)   Anesthesia Other Findings Past Medical History:   Endometriosis                                                Urinary tract infection                                      Phlebitis following infusion                                   Comment:LEFT ARM-AFTER HAVING WISDOME TEETH TAKEN OUT               IN APRIL 2017   Reproductive/Obstetrics negative OB ROS                            Anesthesia Physical Anesthesia Plan  ASA: I  Anesthesia Plan: General   Post-op Pain Management:    Induction:   Airway Management Planned:   Additional Equipment:   Intra-op Plan:   Post-operative Plan:   Informed Consent: I have reviewed the patients History and Physical, chart, labs and discussed the procedure including the risks, benefits and alternatives for the proposed anesthesia with the patient or authorized representative who has indicated his/her understanding and acceptance.    Dental Advisory Given  Plan Discussed with: Anesthesiologist, CRNA and Surgeon  Anesthesia Plan Comments:         Anesthesia Quick Evaluation

## 2015-06-06 NOTE — Progress Notes (Signed)
Patient assisted to the bathroom, felt like she had to urinate; unable to urinate. Did have a lot of menstruating blood in the toilet and stated that she had to wipe x 2. Husband in the bathroom with patient during this time. Bladder scan for only 34 ml. Did have a total of almost 3000 ml Lactated ringers during hospital visit. No urine was recorded. Dr. Rosey Bath notified of the inability to void; suggested to inform Dr. Kenton Kingfisher once he was out of surgery which would be soon. A new bladder scan was obtained and didn't show any urine in the bladder. Dr. Kenton Kingfisher came by to examine the patient. Stated that the bladder was drained after surgery and the kidneys were checked to ensure proper functioning. Will continue to monitor the patient and wait until she is able to urinate before being discharged.

## 2015-06-06 NOTE — Progress Notes (Signed)
Patient's husband brought a grilled cheese sandwich and the patient ate the entire sandwich without feeling nauseated.

## 2015-06-09 ENCOUNTER — Encounter: Payer: Self-pay | Admitting: Obstetrics & Gynecology

## 2015-06-09 LAB — SURGICAL PATHOLOGY

## 2016-01-19 ENCOUNTER — Encounter: Payer: Self-pay | Admitting: Neurology

## 2016-01-19 ENCOUNTER — Ambulatory Visit (INDEPENDENT_AMBULATORY_CARE_PROVIDER_SITE_OTHER): Payer: BLUE CROSS/BLUE SHIELD | Admitting: Neurology

## 2016-01-19 VITALS — BP 116/70 | HR 58 | Ht 68.0 in | Wt 173.0 lb

## 2016-01-19 DIAGNOSIS — G4489 Other headache syndrome: Secondary | ICD-10-CM | POA: Insufficient documentation

## 2016-01-19 DIAGNOSIS — Z5181 Encounter for therapeutic drug level monitoring: Secondary | ICD-10-CM

## 2016-01-19 MED ORDER — PREDNISONE 5 MG PO TABS: ORAL_TABLET | ORAL | 0 refills | Status: DC

## 2016-01-19 MED ORDER — TOPIRAMATE 25 MG PO TABS: ORAL_TABLET | ORAL | 3 refills | Status: DC

## 2016-01-19 NOTE — Patient Instructions (Signed)
   Topamax (topiramate) is a seizure medication that has an FDA approval for seizures and for migraine headache. Potential side effects of this medication include weight loss, cognitive slowing, tingling in the fingers and toes, and carbonated drinks will taste bad. If any significant side effects are noted on this drug, please contact our office.  

## 2016-01-19 NOTE — Progress Notes (Signed)
Reason for visit: Headache  Referring physician: Dr. Trellis Paganini is a 26 y.o. female  History of present illness:  [name] is a 26 year old right-handed white female with a history of headaches that began 3 months prior to this evaluation. The patient claims that prior to this she has never had a headache at any time in her life. The patient claims that her mother may have intermittent headaches, however. The patient was placed on nonsteroidal anti-inflammatory medication for one month but this was of no benefit. The patient claims that the headaches are in the left frontal area associated with a sharp pain, within about 30 minutes the patient may experience dizziness associated with vertigo. She will get stiffness and discomfort in the back of the head and neck with the headache. The headache only lasts 30 or 40 minutes, then goes away but may recur multiple times during the day. The patient does experience phonophobia greater than photophobia with the headache. The patient reports no nausea or vomiting, she denies any numbness or weakness of the face, arms, or legs. She denies any significant cognitive clouding with the headache. The patient denies any problems with balance or difficulty controlling the bowels or the bladder. She is sent to this office for an evaluation. She denies any visual complaints with the headache.  Past Medical History:  Diagnosis Date  . Endometriosis   . Phlebitis following infusion    LEFT ARM-AFTER HAVING WISDOME TEETH TAKEN OUT IN APRIL 2017  . Urinary tract infection     Past Surgical History:  Procedure Laterality Date  . CESAREAN SECTION  2011  . CHROMOPERTUBATION N/A 06/06/2015   Procedure: CHROMOPERTUBATION;  Surgeon: Gae Dry, MD;  Location: [facility] ORS;  Service: Gynecology;  Laterality: N/A;  . ENDOMETRIAL ABLATION     X4  . LAPAROSCOPY N/A 06/06/2015   Procedure: LAPAROSCOPY OPERATIVE/ EXCISION OF ENDOMETRIOSIS;  Surgeon: Gae Dry, MD;  Location: [facility] ORS;  Service: Gynecology;  Laterality: N/A;  . PARTIAL HYSTERECTOMY  2016  . TONSILLECTOMY    . WISDOM TOOTH EXTRACTION  04-2015    Family History  Problem Relation Age of Onset  . Hypertension Mother   . Diabetes Mother   . Migraines Mother   . Hypertension Father   . Diabetes Father   . Brain cancer Paternal Grandfather   . Brain cancer Cousin     Social history:  reports that she has never smoked. She has never used smokeless tobacco. She reports that she does not drink alcohol or use drugs.  Medications:  Prior to Admission medications   Medication Sig Start Date End Date Taking? Authorizing Provider  diphenhydramine-acetaminophen (TYLENOL PM) 25-500 MG TABS tablet Take 1 tablet by mouth at bedtime as needed.    Historical Provider, MD  predniSONE (DELTASONE) 5 MG tablet Begin taking 6 tablets daily, taper by one tablet daily until off the medication. 01/19/16   Kathrynn Ducking, MD  topiramate (TOPAMAX) 25 MG tablet Take one tablet at night for one week, then take 2 tablets at night for one week, then take 3 tablets at night. 01/19/16   Kathrynn Ducking, MD      Allergies  Allergen Reactions  . Coconut Flavor Anaphylaxis  . Morphine And Related Hives and Swelling    ROS:  Out of a complete 14 system review of symptoms, the patient complains only of the following symptoms, and all other reviewed systems are negative.  Weight gain, fatigue  Ringing in the ears, dizziness Blurred vision Headache Insomnia  Blood pressure 116/70, pulse (!) 58, height 5\' 8"  (1.727 m), weight 173 lb (78.5 kg).  Physical Exam  General: The patient is alert and cooperative at the time of the examination.  Eyes: Pupils are equal, round, and reactive to light. Discs are flat bilaterally.  Neck: The neck is supple, no carotid bruits are noted.  Respiratory: The respiratory examination is clear.  Cardiovascular: The cardiovascular examination reveals a regular  rate and rhythm, no obvious murmurs or rubs are noted.  Neuromuscular: Range of movement of the cervical spine is full.  Skin: Extremities are without significant edema.  Neurologic Exam  Mental status: The patient is alert and oriented x 3 at the time of the examination. The patient has apparent normal recent and remote memory, with an apparently normal attention span and concentration ability.  Cranial nerves: Facial symmetry is present. There is good sensation of the face to pinprick and soft touch bilaterally. The strength of the facial muscles and the muscles to head turning and shoulder shrug are normal bilaterally. Speech is well enunciated, no aphasia or dysarthria is noted. Extraocular movements are full. Visual fields are full. The tongue is midline, and the patient has symmetric elevation of the soft palate. No obvious hearing deficits are noted.  Motor: The motor testing reveals 5 over 5 strength of all 4 extremities. Good symmetric motor tone is noted throughout.  Sensory: Sensory testing is intact to pinprick, soft touch, vibration sensation, and position sense on all 4 extremities. No evidence of extinction is noted.  Coordination: Cerebellar testing reveals good finger-nose-finger and heel-to-shin bilaterally.  Gait and station: Gait is normal. Tandem gait is normal. Romberg is negative. No drift is seen.  Reflexes: Deep tendon reflexes are symmetric and normal bilaterally. Toes are downgoing bilaterally.   Assessment/Plan:  1. Daily headache  The patient has had onset of daily headaches, she has never had a headache previously. The headaches are brief, and are unusual for true migraine, but her mother does have a history of headache as well. The patient will be sent for MRI evaluation of the brain with and without gadolinium enhancement. The patient will undergo some blood work today. She will be placed on Topamax and gradually work up on the dose. A prednisone Dosepak  will be given today. She will follow-up in 6-8 weeks.  Jill Alexanders MD 01/19/2016 8:54 AM  [facility] [address] [place], [place] [address]  Phone [phone] Fax [phone]

## 2016-01-20 ENCOUNTER — Telehealth: Payer: Self-pay

## 2016-01-20 LAB — COMPREHENSIVE METABOLIC PANEL
ALT: 12 IU/L (ref 0–32)
AST: 13 IU/L (ref 0–40)
Albumin/Globulin Ratio: 1.8 (ref 1.2–2.2)
Albumin: 4.4 g/dL (ref 3.5–5.5)
Alkaline Phosphatase: 46 IU/L (ref 39–117)
BUN / CREAT RATIO: 14 (ref 9–23)
BUN: 9 mg/dL (ref 6–20)
Bilirubin Total: 0.4 mg/dL (ref 0.0–1.2)
CALCIUM: 8.9 mg/dL (ref 8.7–10.2)
CO2: 23 mmol/L (ref 18–29)
Chloride: 103 mmol/L (ref 96–106)
Creatinine, Ser: 0.65 mg/dL (ref 0.57–1.00)
GFR, EST AFRICAN AMERICAN: 141 mL/min/{1.73_m2} (ref >59)
GFR, EST NON AFRICAN AMERICAN: 122 mL/min/{1.73_m2} (ref >59)
GLUCOSE: 86 mg/dL (ref 65–99)
Globulin, Total: 2.4 g/dL (ref 1.5–4.5)
Potassium: 3.9 mmol/L (ref 3.5–5.2)
Sodium: 142 mmol/L (ref 134–144)
TOTAL PROTEIN: 6.8 g/dL (ref 6.0–8.5)

## 2016-01-20 LAB — SEDIMENTATION RATE: Sed Rate: 2 mm/hr (ref 0–32)

## 2016-01-20 NOTE — Telephone Encounter (Signed)
Called pt w/ unremarkable lab results. May call back w/ additional questions/concerns. 

## 2016-01-20 NOTE — Telephone Encounter (Signed)
-----   Message from Kathrynn Ducking, MD sent at 01/20/2016  7:24 AM EST -----  The blood work results are unremarkable. Please call the patient.

## 2016-01-21 ENCOUNTER — Ambulatory Visit (HOSPITAL_COMMUNITY)
Admission: RE | Admit: 2016-01-21 | Discharge: 2016-01-21 | Disposition: A | Discharge status: Home / Self Care | Payer: BLUE CROSS/BLUE SHIELD | Source: Ambulatory Visit | Attending: Neurology | Admitting: Neurology

## 2016-01-21 DIAGNOSIS — G4489 Other headache syndrome: Secondary | ICD-10-CM | POA: Diagnosis present

## 2016-01-21 MED ORDER — GADOBENATE DIMEGLUMINE 529 MG/ML IV SOLN
20.0000 mL | Freq: Once | INTRAVENOUS | Status: AC | PRN
  Administered 2016-01-21: 16 mL via INTRAVENOUS

## 2016-01-22 ENCOUNTER — Telehealth: Payer: Self-pay | Admitting: Neurology

## 2016-01-22 NOTE — Telephone Encounter (Signed)
I called the patient. The MRI of the brain is normal. She is still having daily headaches, not improved with prednisone or Topamax so far. She is to call if the headaches are not improved on the 75 mg dosing of the Topamax, She may require another medication, will consider Pamelor or Effexor.   MRI brain 01/22/16:  IMPRESSION: Normal MRI of the brain with contrast.

## 2016-03-18 ENCOUNTER — Ambulatory Visit: Payer: BLUE CROSS/BLUE SHIELD | Admitting: Adult Health

## 2016-03-19 ENCOUNTER — Encounter: Payer: Self-pay | Admitting: Adult Health

## 2016-03-31 ENCOUNTER — Ambulatory Visit: Payer: BLUE CROSS/BLUE SHIELD | Admitting: Obstetrics & Gynecology

## 2016-04-27 ENCOUNTER — Ambulatory Visit (INDEPENDENT_AMBULATORY_CARE_PROVIDER_SITE_OTHER): Payer: BLUE CROSS/BLUE SHIELD | Admitting: Advanced Practice Midwife

## 2016-04-27 ENCOUNTER — Encounter: Payer: Self-pay | Admitting: Advanced Practice Midwife

## 2016-04-27 VITALS — BP 114/74 | Ht 68.0 in | Wt 174.0 lb

## 2016-04-27 DIAGNOSIS — Z113 Encounter for screening for infections with a predominantly sexual mode of transmission: Secondary | ICD-10-CM | POA: Diagnosis not present

## 2016-04-27 DIAGNOSIS — Z124 Encounter for screening for malignant neoplasm of cervix: Secondary | ICD-10-CM | POA: Diagnosis not present

## 2016-04-27 DIAGNOSIS — Z01419 Encounter for gynecological examination (general) (routine) without abnormal findings: Secondary | ICD-10-CM

## 2016-04-27 NOTE — Progress Notes (Signed)
Patient ID: Stephanie Hayden, female   DOB: 04/13/1990, 26 y.o.   MRN: [id number]     Gynecology Annual Exam  PCP: Wende Neighbors, MD  Chief Complaint:  Chief Complaint  Patient presents with  . Annual Exam    History of Present Illness: Patient is a 26 y.o. G1P1001 presents for annual exam. The patient has complaints today of difficulty conceiving. She is not using any birth control in the last 4 years and has not had a pregnancy in that time. She is not interested in fertility treatments due to expense. She has a history of endometriosis.   LMP: Patient's last menstrual period was 04/23/2016. Average Interval: regular, 28 days Duration of flow: 5 days Heavy Menses: no Clots: yes Intermenstrual Bleeding: no Postcoital Bleeding: no Dysmenorrhea: yes  The patient is sexually active. She currently uses nothing for contraception. She denies dyspareunia.  The patient does perform self breast exams.  There is no notable family history of breast or ovarian cancer in her family. She has paternal and maternal aunts with breast cancer but she does not know their ages at diagnosis.  The patient wears seatbelts: yes.   The patient has regular exercise: yes. She admits to needing to improve her diet.   The patient denies current symptoms of depression.    Review of Systems: Review of Systems  Constitutional: Negative.   HENT: Negative.   Eyes: Negative.   Respiratory: Negative.   Cardiovascular: Negative.   Gastrointestinal: Negative.   Genitourinary: Negative.   Musculoskeletal: Negative.   Skin: Negative.   Neurological: Negative.   Endo/Heme/Allergies: Negative.   Psychiatric/Behavioral: Negative.     Past Medical History:  Past Medical History:  Diagnosis Date  . Endometriosis   . Phlebitis following infusion    LEFT ARM-AFTER HAVING WISDOME TEETH TAKEN OUT IN APRIL 2017  . Urinary tract infection     Past Surgical History:  Past Surgical History:  Procedure Laterality  Date  . CESAREAN SECTION  2011  . CHROMOPERTUBATION N/A 06/06/2015   Procedure: CHROMOPERTUBATION;  Surgeon: Gae Dry, MD;  Location: [facility] ORS;  Service: Gynecology;  Laterality: N/A;  . ENDOMETRIAL ABLATION     X4  . LAPAROSCOPY N/A 06/06/2015   Procedure: LAPAROSCOPY OPERATIVE/ EXCISION OF ENDOMETRIOSIS;  Surgeon: Gae Dry, MD;  Location: [facility];  Service: Gynecology;  Laterality: N/A;  . PARTIAL HYSTERECTOMY  2016  . TONSILLECTOMY    . WISDOM TOOTH EXTRACTION  04-2015    Gynecologic History:  Patient's last menstrual period was 04/23/2016. Contraception: none Last Pap: Results were: no abnormalities   Obstetric History: G1P1001  Family History:  Family History  Problem Relation Age of Onset  . Hypertension Mother   . Diabetes Mother   . Migraines Mother   . Hypertension Father   . Diabetes Father   . Brain cancer Paternal Grandfather   . Brain cancer Cousin   . Breast cancer Paternal Aunt 65  . Breast cancer Paternal Aunt 109  . Breast cancer Paternal Aunt 36    Social History:  Social History   Social History  . Marital status: Married    Spouse name: N/A  . Number of children: 1  . Years of education: College   Occupational History  . [employer]    Social History Main Topics  . Smoking status: Never Smoker  . Smokeless tobacco: Never Used  . Alcohol use No  . Drug use: No  . Sexual activity: Yes  Partners: Male    Birth control/ protection: Surgical   Other Topics Concern  . Not on file   Social History Narrative   Lives at home w/ her husband and daughter   Right-handed   Caffeine: coffee in the morning, tea throughout the day    Allergies:  Allergies  Allergen Reactions  . Coconut Flavor Anaphylaxis  . Morphine And Related Hives and Swelling    Medications: Prior to Admission medications   Medication Sig Start Date End Date Taking? Authorizing Provider  topiramate (TOPAMAX) 25 MG tablet Take one tablet at night for  one week, then take 2 tablets at night for one week, then take 3 tablets at night. 01/19/16  Yes Kathrynn Ducking, MD  cyclobenzaprine (FLEXERIL) 10 MG tablet  02/02/16   Historical Provider, MD  diphenhydramine-acetaminophen (TYLENOL PM) 25-500 MG TABS tablet Take 1 tablet by mouth at bedtime as needed.    Historical Provider, MD    Physical Exam Vitals: Blood pressure 114/74, height 5\' 8"  (1.727 m), weight 174 lb (78.9 kg), last menstrual period 04/23/2016.  General: NAD HEENT: normocephalic, anicteric Thyroid: no enlargement, no palpable nodules Pulmonary: No increased work of breathing, CTAB Cardiovascular: RRR, distal pulses 2+ Breast: Breast symmetrical, no tenderness, no palpable nodules or masses, no skin or nipple retraction present, no nipple discharge.  No axillary or supraclavicular lymphadenopathy. Abdomen: NABS, soft, non-tender, non-distended.  Umbilicus without lesions.  No hepatomegaly, splenomegaly or masses palpable. No evidence of hernia  Genitourinary:  External: Normal external female genitalia.  Normal urethral meatus, normal  Bartholin's and Skene's glands.    Vagina: Normal vaginal mucosa, no evidence of prolapse.    Cervix: Grossly normal in appearance, no bleeding, no CMT  Uterus: Non-enlarged, mobile, normal contour.    Adnexa: ovaries non-enlarged, no adnexal masses  Rectal: deferred  Lymphatic: no evidence of inguinal lymphadenopathy Extremities: no edema, erythema, or tenderness Neurologic: Grossly intact Psychiatric: mood appropriate, affect full   Assessment: 26 y.o. G1P1001 Well woman with PAP   Plan: Problem List Items Addressed This Visit    None    Visit Diagnoses    Well woman exam with routine gynecological exam    -  Primary   Relevant Orders   IGP,CtNgTv,rfx Aptima HPV ASCU   Cervical cancer screening       Relevant Orders   IGP,CtNgTv,rfx Aptima HPV ASCU   Screen for sexually transmitted diseases       Relevant Orders    IGP,CtNgTv,rfx Aptima HPV ASCU      1) Healthy diet encouraged  2) ASCCP guidelines and rational discussed.  Patient opts for yearly screening interval  3) Contraception - Education given regarding options for contraception: pt currently is attempting to conceive and chooses no contraception  4) Arvigo massage recommended for increased chance of conception: information given for CNM at [employer] [facility] [employer] in [place] who practices the technique  5) Follow up 1 year for routine annual exam   Rod Can, CNM

## 2016-04-28 ENCOUNTER — Encounter: Payer: Self-pay | Admitting: Obstetrics and Gynecology

## 2016-04-29 LAB — IGP,CTNGTV,RFX APTIMA HPV ASCU
CHLAMYDIA, NUC. ACID AMP: NEGATIVE
Gonococcus, Nuc. Acid Amp: NEGATIVE
PAP SMEAR COMMENT: 0
TRICH VAG BY NAA: NEGATIVE

## 2016-05-07 ENCOUNTER — Encounter: Payer: Self-pay | Admitting: Obstetrics and Gynecology

## 2016-09-28 ENCOUNTER — Ambulatory Visit (INDEPENDENT_AMBULATORY_CARE_PROVIDER_SITE_OTHER): Payer: Commercial Managed Care - PPO | Admitting: Obstetrics & Gynecology

## 2016-09-28 ENCOUNTER — Encounter: Payer: Self-pay | Admitting: Obstetrics & Gynecology

## 2016-09-28 ENCOUNTER — Telehealth: Payer: Self-pay | Admitting: Obstetrics & Gynecology

## 2016-09-28 VITALS — BP 110/70 | HR 60 | Ht 68.0 in | Wt 179.0 lb

## 2016-09-28 DIAGNOSIS — R635 Abnormal weight gain: Secondary | ICD-10-CM

## 2016-09-28 DIAGNOSIS — N920 Excessive and frequent menstruation with regular cycle: Secondary | ICD-10-CM | POA: Diagnosis not present

## 2016-09-28 DIAGNOSIS — N946 Dysmenorrhea, unspecified: Secondary | ICD-10-CM | POA: Diagnosis not present

## 2016-09-28 NOTE — Telephone Encounter (Signed)
Pt scheduled 10/16 for mirena insert with [facility].

## 2016-09-28 NOTE — Progress Notes (Signed)
HPI:      Stephanie Hayden is a 26 y.o. G1P1001 who LMP was Patient's last menstrual period was 09/14/2016., presents today for a problem visit.  She complains of menorrhagia and pain w menses that  began several months ago and its severity is described as moderate.  She has regular periods every 28 days and they are associated with moderate menstrual cramping.  She has used the following for attempts at control: maxi pad.  Previous evaluation: yes. Prior Diagnosis: Endometriosis. Previous Treatment: NSAID  with mild improvement Surgery lap x2 with mild improvement. Has been off of BC for one year to try to conceive, has seen infertility in past asn does not desire any further intervention.  Actually desires IUD again. Concerned over weight gain as well, 20 lbs this year.  PMHx: She  has a past medical history of Endometriosis; Family history of breast cancer; Phlebitis following infusion; and Urinary tract infection. Also,  has a past surgical history that includes Endometrial ablation; Cesarean section (2011); Tonsillectomy; Wisdom tooth extraction (04-2015); laparoscopy (N/A, 06/06/2015); Chromopertubation (N/A, 06/06/2015); and Partial hysterectomy (2016)., family history includes Brain cancer in her cousin and paternal grandfather; Breast cancer (age of onset: 53) in her paternal aunt, paternal aunt, and paternal aunt; Diabetes in her father and mother; Hypertension in her father and mother; Migraines in her mother.,  reports that she has never smoked. She has never used smokeless tobacco. She reports that she does not drink alcohol or use drugs.  She  Current Outpatient Prescriptions:  .  cyclobenzaprine (FLEXERIL) 10 MG tablet, , Disp: , Rfl: 0 .  diphenhydramine-acetaminophen (TYLENOL PM) 25-500 MG TABS tablet, Take 1 tablet by mouth at bedtime as needed., Disp: , Rfl:  .  topiramate (TOPAMAX) 25 MG tablet, Take one tablet at night for one week, then take 2 tablets at night for one week,  then take 3 tablets at night., Disp: 90 tablet, Rfl: 3  Also, is allergic to coconut flavor and morphine and related.  Review of Systems  Constitutional: Negative for chills, fever and malaise/fatigue.  HENT: Negative for congestion, sinus pain and sore throat.   Eyes: Negative for blurred vision and pain.  Respiratory: Negative for cough and wheezing.   Cardiovascular: Negative for chest pain and leg swelling.  Gastrointestinal: Negative for abdominal pain, constipation, diarrhea, heartburn, nausea and vomiting.  Genitourinary: Negative for dysuria, frequency, hematuria and urgency.  Musculoskeletal: Negative for back pain, joint pain, myalgias and neck pain.  Skin: Negative for itching and rash.  Neurological: Negative for dizziness, tremors and weakness.  Endo/Heme/Allergies: Does not bruise/bleed easily.  Psychiatric/Behavioral: Negative for depression. The patient is not nervous/anxious and does not have insomnia.     Objective: BP 110/70   Pulse 60   Ht 5\' 8"  (1.727 m)   Wt 179 lb (81.2 kg)   LMP 09/14/2016   BMI 27.22 kg/m  Physical Exam  Constitutional: She is oriented to person, place, and time. She appears well-developed and well-nourished. No distress.  Genitourinary: Rectum normal, vagina normal and uterus normal. Pelvic exam was performed with patient supine. There is no rash or lesion on the right labia. There is no rash or lesion on the left labia. Vagina exhibits no lesion. No bleeding in the vagina. Right adnexum does not display mass and does not display tenderness. Left adnexum does not display mass and does not display tenderness. Cervix does not exhibit motion tenderness, lesion, friability or polyp.   Uterus is mobile and midaxial.  Uterus is not enlarged or exhibiting a mass.  HENT:  Head: Normocephalic and atraumatic. Head is without laceration.  Right Ear: Hearing normal.  Left Ear: Hearing normal.  Nose: No epistaxis.  No foreign bodies.  Mouth/Throat:  Uvula is midline, oropharynx is clear and moist and mucous membranes are normal.  Eyes: Pupils are equal, round, and reactive to light.  Neck: Normal range of motion. Neck supple. No thyromegaly present.  Cardiovascular: Normal rate and regular rhythm.  Exam reveals no gallop and no friction rub.   No murmur heard. Pulmonary/Chest: Effort normal and breath sounds normal. No respiratory distress. She has no wheezes. Right breast exhibits no mass, no skin change and no tenderness. Left breast exhibits no mass, no skin change and no tenderness.  Abdominal: Soft. Bowel sounds are normal. She exhibits no distension. There is no tenderness. There is no rebound.  Musculoskeletal: Normal range of motion.  Neurological: She is alert and oriented to person, place, and time. No cranial nerve deficit.  Skin: Skin is warm and dry.  Psychiatric: She has a normal mood and affect. Judgment normal.  Vitals reviewed.   ASSESSMENT/PLAN:  menorrhagia and dysmenorrhea  Problem List Items Addressed This Visit      Genitourinary   Dysmenorrhea     Other   Menorrhagia with regular cycle - Primary   Weight gain, abnormal    Plan Mirena, other alternatives discussed Plan weight loss medicine Wait for period before above plan  Barnett Applebaum, MD, [facility], [facility] 09/28/2016  3:42 PM

## 2016-10-08 NOTE — Telephone Encounter (Signed)
Noted. Will order to arrive by apt date/time. 

## 2016-10-19 ENCOUNTER — Ambulatory Visit (INDEPENDENT_AMBULATORY_CARE_PROVIDER_SITE_OTHER): Payer: Commercial Managed Care - PPO | Admitting: Obstetrics & Gynecology

## 2016-10-19 ENCOUNTER — Encounter: Payer: Self-pay | Admitting: Obstetrics & Gynecology

## 2016-10-19 VITALS — BP 110/70 | HR 64 | Ht 68.0 in | Wt 181.0 lb

## 2016-10-19 DIAGNOSIS — N809 Endometriosis, unspecified: Secondary | ICD-10-CM | POA: Diagnosis not present

## 2016-10-19 DIAGNOSIS — R109 Unspecified abdominal pain: Secondary | ICD-10-CM | POA: Diagnosis not present

## 2016-10-19 MED ORDER — LEUPROLIDE ACETATE (3 MONTH) 11.25 MG IM KIT: 11.2500 mg | PACK | INTRAMUSCULAR | 1 refills | Status: DC

## 2016-10-19 MED ORDER — CLOTRIMAZOLE-BETAMETHASONE 1-0.05 % EX CREA: 1.0000 "application " | TOPICAL_CREAM | Freq: Two times a day (BID) | CUTANEOUS | 1 refills | Status: DC

## 2016-10-19 MED ORDER — PHENTERMINE HCL 37.5 MG PO TABS: 37.5000 mg | ORAL_TABLET | Freq: Every day | ORAL | 0 refills | Status: DC

## 2016-10-19 MED ORDER — NORETHINDRONE 0.35 MG PO TABS: 1.0000 | ORAL_TABLET | Freq: Every day | ORAL | 5 refills | Status: DC

## 2016-10-19 NOTE — Patient Instructions (Addendum)
Leuprolide depot injection What is this medicine? LEUPROLIDE (loo PROE lide) is a man-made protein that acts like a natural hormone in the body. It decreases testosterone in men and decreases estrogen in women. In men, this medicine is used to treat advanced prostate cancer. In women, some forms of this medicine may be used to treat endometriosis, uterine fibroids, or other female hormone-related problems. This medicine may be used for other purposes; ask your health care provider or pharmacist if you have questions. COMMON BRAND NAME(S): Eligard, Lupron Depot, Lupron Depot-Ped, Viadur What should I tell my health care provider before I take this medicine? They need to know if you have any of these conditions: -diabetes -heart disease or previous heart attack -high blood pressure -high cholesterol -mental illness -osteoporosis -pain or difficulty passing urine -seizures -spinal cord metastasis -stroke -suicidal thoughts, plans, or attempt; a previous suicide attempt by you or a family member -tobacco smoker -unusual vaginal bleeding (women) -an unusual or allergic reaction to leuprolide, benzyl alcohol, other medicines, foods, dyes, or preservatives -pregnant or trying to get pregnant -breast-feeding How should I use this medicine? This medicine is for injection into a muscle or for injection under the skin. It is given by a health care professional in a hospital or clinic setting. The specific product will determine how it will be given to you. Make sure you understand which product you receive and how often you will receive it. Talk to your pediatrician regarding the use of this medicine in children. Special care may be needed. Overdosage: If you think you have taken too much of this medicine contact a poison control center or emergency room at once. NOTE: This medicine is only for you. Do not share this medicine with others. What if I miss a dose? It is important not to miss a dose.  Call your doctor or health care professional if you are unable to keep an appointment. Depot injections: Depot injections are given either once-monthly, every 12 weeks, every 16 weeks, or every 24 weeks depending on the product you are prescribed. The product you are prescribed will be based on if you are female or female, and your condition. Make sure you understand your product and dosing. What may interact with this medicine? Do not take this medicine with any of the following medications: -chasteberry This medicine may also interact with the following medications: -herbal or dietary supplements, like black cohosh or DHEA -female hormones, like estrogens or progestins and birth control pills, patches, rings, or injections -female hormones, like testosterone This list may not describe all possible interactions. Give your health care provider a list of all the medicines, herbs, non-prescription drugs, or dietary supplements you use. Also tell them if you smoke, drink alcohol, or use illegal drugs. Some items may interact with your medicine. What should I watch for while using this medicine? Visit your doctor or health care professional for regular checks on your progress. During the first weeks of treatment, your symptoms may get worse, but then will improve as you continue your treatment. You may get hot flashes, increased bone pain, increased difficulty passing urine, or an aggravation of nerve symptoms. Discuss these effects with your doctor or health care professional, some of them may improve with continued use of this medicine. Female patients may experience a menstrual cycle or spotting during the first months of therapy with this medicine. If this continues, contact your doctor or health care professional. What side effects may I notice from receiving this medicine? Side   effects that you should report to your doctor or health care professional as soon as possible: -allergic reactions like skin  rash, itching or hives, swelling of the face, lips, or tongue -breathing problems -chest pain -depression or memory disorders -pain in your legs or groin -pain at site where injected or implanted -seizures -severe headache -swelling of the feet and legs -suicidal thoughts or other mood changes -visual changes -vomiting Side effects that usually do not require medical attention (report to your doctor or health care professional if they continue or are bothersome): -breast swelling or tenderness -decrease in sex drive or performance -diarrhea -hot flashes -loss of appetite -muscle, joint, or bone pains -nausea -redness or irritation at site where injected or implanted -skin problems or acne This list may not describe all possible side effects. Call your doctor for medical advice about side effects. You may report side effects to FDA at 1-800-FDA-1088. Where should I keep my medicine? This drug is given in a hospital or clinic and will not be stored at home. NOTE: This sheet is a summary. It may not cover all possible information. If you have questions about this medicine, talk to your doctor, pharmacist, or health care provider.  2018 Elsevier/Gold Standard (2015-06-05 09:45:53)  

## 2016-10-19 NOTE — Progress Notes (Addendum)
  History of Present Illness:  Stephanie Hayden is a 26 y.o. who is here for ENDOMETRIOSIS follow up. Continues w pain esp w periods, and heavy periods.  Wants option for fertility.  Also has had noted bowel endometriosis and has worsening pain w BMs now.  PMHx: She  has a past medical history of Endometriosis; Family history of breast cancer; Phlebitis following infusion; and Urinary tract infection. Also,  has a past surgical history that includes Endometriosis ablation; Cesarean section (2011); Tonsillectomy; Wisdom tooth extraction (04-2015); laparoscopy (N/A, 06/06/2015); Chromopertubation (N/A, 06/06/2015); family history includes Brain cancer in her cousin and paternal grandfather; Breast cancer (age of onset: 35) in her paternal aunt, paternal aunt, and paternal aunt; Diabetes in her father and mother; Hypertension in her father and mother; Migraines in her mother.,  reports that she has never smoked. She has never used smokeless tobacco. She reports that she does not drink alcohol or use drugs. No outpatient prescriptions have been marked as taking for the 10/19/16 encounter (Office Visit) with Gae Dry, MD.  . Also, is allergic to coconut flavor and morphine and related..  Review of Systems  All other systems reviewed and are negative.   Physical Exam:  BP 110/70   Pulse 64   Ht 5\' 8"  (1.727 m)   Wt 181 lb (82.1 kg)   LMP 10/11/2016   BMI 27.52 kg/m  Body mass index is 27.52 kg/m. Constitutional: Well nourished, well developed female in no acute distress.  Abdomen: diffusely non tender to palpation, non distended, and no masses, hernias Neuro: Grossly intact Psych:  Normal mood and affect.    Assessment:  Problem List Items Addressed This Visit      Other   Endometriosis - Primary   Abdominal pain in female    .  Plan: She will undergo change in her medical therapy. All options discussed, will plan LUPRON for 6 mos, then see about spon fertility.  No surgery  desired. Also see about resolution of pain sx's after therapy. Add back therapy w progesterone. Weight loss medicine discussed as well. She was amenable to this plan and we will see her back for injection (Rx to [facility]).  Barnett Applebaum, MD, [facility], [facility] 10/22/2016  11:23 AM

## 2016-10-22 ENCOUNTER — Encounter: Payer: Self-pay | Admitting: Obstetrics & Gynecology

## 2016-10-26 ENCOUNTER — Telehealth: Payer: Self-pay | Admitting: Obstetrics & Gynecology

## 2016-10-26 ENCOUNTER — Other Ambulatory Visit: Payer: Self-pay

## 2016-10-26 DIAGNOSIS — N809 Endometriosis, unspecified: Secondary | ICD-10-CM

## 2016-10-26 MED ORDER — LEUPROLIDE ACETATE (3 MONTH) 11.25 MG IM KIT: 11.2500 mg | PACK | INTRAMUSCULAR | 1 refills | Status: DC

## 2016-10-26 NOTE — Telephone Encounter (Signed)
Pt is schedule 10/29/16 for Lupron injection. Pt reports the injection needs to be sent the [facility]. Please advise

## 2016-10-26 NOTE — Telephone Encounter (Signed)
Done

## 2016-10-29 ENCOUNTER — Ambulatory Visit: Payer: Commercial Managed Care - PPO | Admitting: Obstetrics & Gynecology

## 2016-10-29 ENCOUNTER — Other Ambulatory Visit: Payer: Self-pay

## 2016-11-22 ENCOUNTER — Telehealth: Payer: Self-pay

## 2016-11-22 NOTE — Telephone Encounter (Signed)
Pt called the after hour nurse 11/21/16 at 4:31pm c/o has endometriosis, n/v, cramps, pos preg tests, mildly bleeding, lost 7lbs since Wed when n/v started.  After hour nurse adv pt to be seen within 4hrs either here or an urgent care.  Pt called first thing this am and has rescheduled NOB appt to tomorrow am.

## 2016-11-23 ENCOUNTER — Ambulatory Visit (INDEPENDENT_AMBULATORY_CARE_PROVIDER_SITE_OTHER): Payer: Commercial Managed Care - PPO | Admitting: Maternal Newborn

## 2016-11-23 ENCOUNTER — Encounter: Payer: Self-pay | Admitting: Maternal Newborn

## 2016-11-23 ENCOUNTER — Ambulatory Visit (INDEPENDENT_AMBULATORY_CARE_PROVIDER_SITE_OTHER): Payer: Commercial Managed Care - PPO

## 2016-11-23 VITALS — BP 120/70 | Wt 180.0 lb

## 2016-11-23 DIAGNOSIS — Z3480 Encounter for supervision of other normal pregnancy, unspecified trimester: Secondary | ICD-10-CM | POA: Diagnosis not present

## 2016-11-23 DIAGNOSIS — Z362 Encounter for other antenatal screening follow-up: Secondary | ICD-10-CM

## 2016-11-23 DIAGNOSIS — B3731 Acute candidiasis of vulva and vagina: Secondary | ICD-10-CM

## 2016-11-23 DIAGNOSIS — B373 Candidiasis of vulva and vagina: Secondary | ICD-10-CM

## 2016-11-23 DIAGNOSIS — N912 Amenorrhea, unspecified: Secondary | ICD-10-CM | POA: Diagnosis not present

## 2016-11-23 LAB — POCT WET PREP (WET MOUNT)
Clue Cells Wet Prep Whiff POC: NEGATIVE
TRICHOMONAS WET PREP HPF POC: ABSENT

## 2016-11-23 LAB — POCT URINE PREGNANCY: PREG TEST UR: POSITIVE — AB

## 2016-11-23 MED ORDER — DOXYLAMINE-PYRIDOXINE 10-10 MG PO TBEC
2.0000 | DELAYED_RELEASE_TABLET | Freq: Every day | ORAL | 5 refills | Status: DC
Start: 2016-11-23 — End: 2016-12-21

## 2016-11-23 MED ORDER — TERCONAZOLE 0.4 % VA CREA: 1.0000 | TOPICAL_CREAM | Freq: Every day | VAGINAL | 1 refills | Status: AC

## 2016-11-23 NOTE — Progress Notes (Signed)
C/o nausea/vomiting, has lost 7lbs this week, can't eat

## 2016-11-23 NOTE — Progress Notes (Signed)
11/23/2016   Chief Complaint: Amenorrhea, positive home pregnancy test, desires prenatal care.  Transfer of Care Patient: no  History of Present Illness: [name] is a 26 y.o. G2P1001 at [redacted]w[redacted]d based on Patient's last menstrual period on 10/11/2016 (exact date), with an Estimated Date of Delivery: 07/18/17, with the above CC.   Her periods were: regular periods every 30 days She was using no method when she conceived.  She has Positive signs or symptoms of nausea/vomiting of pregnancy. She has Negative signs or symptoms of miscarriage or preterm labor - some bleeding on and off but small amounts and no pain/clots. She identifies Positive Zika risk factors for her and her partner - low risk, a stop in [place], [place] for a few hours during a cruise. On any different medications around the time she conceived/early pregnancy: No  History of varicella: Yes  She has vaginal and vulvar itching and burning that started yesterday.  ROS: A 12-point review of systems was performed and negative, except as stated in the above HPI.  OBGYN History: As per HPI. OB History  Gravida Para Term Preterm AB Living  2 1 1     1   SAB TAB Ectopic Multiple Live Births          1    # Outcome Date GA Lbr Len/2nd Weight Sex Delivery Anes PTL Lv  2 Current           1 Term     F CS-LTranv   LIV     Any issues with any prior pregnancies: no Any prior children are healthy, doing well, without any problems or issues: yes History of pap smears: Yes. Last pap smear 04/27/2016. NILM. History of STIs: No   Past Medical History: Past Medical History:  Diagnosis Date  . Endometriosis   . Family history of breast cancer    genetic testing letter sent 5/18  . Phlebitis following infusion    LEFT ARM-AFTER HAVING WISDOME TEETH TAKEN OUT IN APRIL 2017  . Urinary tract infection     Past Surgical History: Past Surgical History:  Procedure Laterality Date  . ABLATION ON ENDOMETRIOSIS  2017  . CESAREAN  SECTION  2011  . CHROMOPERTUBATION N/A 06/06/2015   Procedure: CHROMOPERTUBATION;  Surgeon: Gae Dry, MD;  Location: [facility] ORS;  Service: Gynecology;  Laterality: N/A;  . LAPAROSCOPY N/A 06/06/2015   Procedure: LAPAROSCOPY OPERATIVE/ EXCISION OF ENDOMETRIOSIS;  Surgeon: Gae Dry, MD;  Location: [facility] ORS;  Service: Gynecology;  Laterality: N/A;  . TONSILLECTOMY    . WISDOM TOOTH EXTRACTION  04-2015    Family History:  Family History  Problem Relation Age of Onset  . Hypertension Mother   . Diabetes Mother   . Migraines Mother   . Hypertension Father   . Diabetes Father   . Brain cancer Paternal Grandfather   . Brain cancer Cousin   . Breast cancer Paternal Aunt 81  . Breast cancer Paternal Aunt 28  . Breast cancer Paternal Aunt 26   She denies any female cancers, bleeding or blood clotting disorders.  She denies any history of intellectual disability, birth defects or genetic disorders in her or the FOB's history  Social History:  Social History   Socioeconomic History  . Marital status: Married    Spouse name: Not on file  . Number of children: 1  . Years of education: College  . Highest education level: Not on file  Social Needs  . Financial resource strain: Not on  file  . Food insecurity - worry: Not on file  . Food insecurity - inability: Not on file  . Transportation needs - medical: Not on file  . Transportation needs - non-medical: Not on file  Occupational History  . Occupation: Teacher, early years/pre  Tobacco Use  . Smoking status: Never Smoker  . Smokeless tobacco: Never Used  Substance and Sexual Activity  . Alcohol use: No  . Drug use: No  . Sexual activity: Yes    Partners: Male    Birth control/protection: Surgical  Other Topics Concern  . Not on file  Social History Narrative   Lives at home w/ her husband and daughter   Right-handed   Caffeine: coffee in the morning, tea throughout the day   Any cats in the household:  no  Allergy: Allergies  Allergen Reactions  . Coconut Flavor Anaphylaxis  . Morphine And Related Hives and Swelling    Current Outpatient Medications:  Current Outpatient Medications:  .  clotrimazole-betamethasone (LOTRISONE) cream, Apply 1 application topically 2 (two) times daily., Disp: 30 g, Rfl: 1 .  diphenhydramine-acetaminophen (TYLENOL PM) 25-500 MG TABS tablet, Take 1 tablet by mouth at bedtime as needed., Disp: , Rfl:  .  Doxylamine-Pyridoxine (DICLEGIS) 10-10 MG TBEC, Take 2 tablets by mouth at bedtime. If symptoms persist, add one tablet in the morning and one in the afternoon, Disp: 100 tablet, Rfl: 5 .  terconazole (TERAZOL 7) 0.4 % vaginal cream, Place 1 applicator vaginally at bedtime for 7 days., Disp: 45 g, Rfl: 1   Physical Exam:   BP 120/70   Wt 180 lb (81.6 kg)   LMP 10/11/2016 (Exact Date)   BMI 27.37 kg/m  Body mass index is 27.37 kg/m. Constitutional: Well nourished, well developed female in no acute distress.  Neck:  Supple, normal appearance, and no thyromegaly  Cardiovascular: S1, S2 normal, no murmur, rub or gallop, regular rate and rhythm Respiratory:  Clear to auscultation bilateral. Normal respiratory effort Abdomen: positive bowel sounds and no masses, hernias; diffusely non tender to palpation, non distended Breasts: breasts appear normal, no suspicious masses, no skin or nipple changes or axillary nodes. Neuro/Psych:  Normal mood and affect.  Skin:  Warm and dry.  Lymphatic:  No inguinal lymphadenopathy.   Pelvic exam: is not limited by body habitus External genitalia, Bartholin's glands, Urethra, Skene's glands: within normal limits Vagina: within normal limits and with no blood in the vault  Cervix: normal appearing cervix without discharge or lesions, closed/long/high Uterus:  enlarged: consistent with early pregnancy Adnexa:  normal adnexa   Wet prep showed moderate yeast, no clue cells, negative whiff, pH 3.0, no  trichomonads.  Assessment: [name] is a 3 y.o. G2P1001 at [redacted]w[redacted]d based on Patient's last menstrual period on 10/11/2016 (exact date), with an Estimated Date of Delivery: 07/18/17, presenting for prenatal care.  Plan:  1) Avoid alcoholic beverages. 2) Patient encouraged not to smoke.  3) Discontinue the use of all non-medicinal drugs and chemicals.  4) Take prenatal vitamins daily.  5) Seatbelt use advised 6) Nutrition, food safety (fish, cheese advisories, and high nitrite foods) and exercise discussed. 7) Hospital and practice style delivering at [facility] discussed  8) Patient is asked about travel to areas at risk for the [place] virus, and counseled to avoid travel and exposure to mosquitoes or sexual partners who may have themselves been exposed to the virus. Testing is discussed, and will be ordered as appropriate.  9) Childbirth classes at [facility] advised 10) Genetic  Screening, such as with 1st Trimester Screening, cell free fetal DNA, AFP testing, and Ultrasound, as well as with amniocentesis and CVS as appropriate, is discussed with patient. She plans to have genetic testing this pregnancy (First trimester screen). 11) NOB labs today. 12) Diclegis Rx for nausea and vomiting. Patient is having trouble keeping anything down. She will call if Diclegis is not effective. 13) Rx for terconazole sent for yeast infection.  Ultrasound today showed single IUP, GA=dates.     Problem list reviewed and updated.  Return in 4 weeks (on 12/21/2016) for ROB .  Avel Sensor, CNM [facility] 11/23/2016  4:54 PM

## 2016-11-23 NOTE — Patient Instructions (Signed)
First Trimester of Pregnancy The first trimester of pregnancy is from week 1 until the end of week 13 (months 1 through 3). A week after a sperm fertilizes an egg, the egg will implant on the wall of the uterus. This embryo will begin to develop into a baby. Genes from you and your partner will form the baby. The female genes will determine whether the baby will be a boy or a girl. At 6-8 weeks, the eyes and face will be formed, and the heartbeat can be seen on ultrasound. At the end of 12 weeks, all the baby's organs will be formed. Now that you are pregnant, you will want to do everything you can to have a healthy baby. Two of the most important things are to get good prenatal care and to follow your health care provider's instructions. Prenatal care is all the medical care you receive before the baby's birth. This care will help prevent, find, and treat any problems during the pregnancy and childbirth. Body changes during your first trimester Your body goes through many changes during pregnancy. The changes vary from woman to woman.  You may gain or lose a couple of pounds at first.  You may feel sick to your stomach (nauseous) and you may throw up (vomit). If the vomiting is uncontrollable, call your health care provider.  You may tire easily.  You may develop headaches that can be relieved by medicines. All medicines should be approved by your health care provider.  You may urinate more often. Painful urination may mean you have a bladder infection.  You may develop heartburn as a result of your pregnancy.  You may develop constipation because certain hormones are causing the muscles that push stool through your intestines to slow down.  You may develop hemorrhoids or swollen veins (varicose veins).  Your breasts may begin to grow larger and become tender. Your nipples may stick out more, and the tissue that surrounds them (areola) may become darker.  Your gums may bleed and may be  sensitive to brushing and flossing.  Dark spots or blotches (chloasma, mask of pregnancy) may develop on your face. This will likely fade after the baby is born.  Your menstrual periods will stop.  You may have a loss of appetite.  You may develop cravings for certain kinds of food.  You may have changes in your emotions from day to day, such as being excited to be pregnant or being concerned that something may go wrong with the pregnancy and baby.  You may have more vivid and strange dreams.  You may have changes in your hair. These can include thickening of your hair, rapid growth, and changes in texture. Some women also have hair loss during or after pregnancy, or hair that feels dry or thin. Your hair will most likely return to normal after your baby is born.  What to expect at prenatal visits During a routine prenatal visit:  You will be weighed to make sure you and the baby are growing normally.  Your blood pressure will be taken.  Your abdomen will be measured to track your baby's growth.  The fetal heartbeat will be listened to between weeks 10 and 14 of your pregnancy.  Test results from any previous visits will be discussed.  Your health care provider may ask you:  How you are feeling.  If you are feeling the baby move.  If you have had any abnormal symptoms, such as leaking fluid, bleeding, severe headaches,   or abdominal cramping.  If you are using any tobacco products, including cigarettes, chewing tobacco, and electronic cigarettes.  If you have any questions.  Other tests that may be performed during your first trimester include:  Blood tests to find your blood type and to check for the presence of any previous infections. The tests will also be used to check for low iron levels (anemia) and protein on red blood cells (Rh antibodies). Depending on your risk factors, or if you previously had diabetes during pregnancy, you may have tests to check for high blood  sugar that affects pregnant women (gestational diabetes).  Urine tests to check for infections, diabetes, or protein in the urine.  An ultrasound to confirm the proper growth and development of the baby.  Fetal screens for spinal cord problems (spina bifida) and Down syndrome.  HIV (human immunodeficiency virus) testing. Routine prenatal testing includes screening for HIV, unless you choose not to have this test.  You may need other tests to make sure you and the baby are doing well.  Follow these instructions at home: Medicines  Follow your health care provider's instructions regarding medicine use. Specific medicines may be either safe or unsafe to take during pregnancy.  Take a prenatal vitamin that contains at least 600 micrograms (mcg) of folic acid.  If you develop constipation, try taking a stool softener if your health care provider approves. Eating and drinking  Eat a balanced diet that includes fresh fruits and vegetables, whole grains, good sources of protein such as meat, eggs, or tofu, and low-fat dairy. Your health care provider will help you determine the amount of weight gain that is right for you.  Avoid raw meat and uncooked cheese. These carry germs that can cause birth defects in the baby.  Eating four or five small meals rather than three large meals a day may help relieve nausea and vomiting. If you start to feel nauseous, eating a few soda crackers can be helpful. Drinking liquids between meals, instead of during meals, also seems to help ease nausea and vomiting.  Limit foods that are high in fat and processed sugars, such as fried and sweet foods.  To prevent constipation: ? Eat foods that are high in fiber, such as fresh fruits and vegetables, whole grains, and beans. ? Drink enough fluid to keep your urine clear or pale yellow. Activity  Exercise only as directed by your health care provider. Most women can continue their usual exercise routine during  pregnancy. Try to exercise for 30 minutes at least 5 days a week. Exercising will help you: ? Control your weight. ? Stay in shape. ? Be prepared for labor and delivery.  Experiencing pain or cramping in the lower abdomen or lower back is a good sign that you should stop exercising. Check with your health care provider before continuing with normal exercises.  Try to avoid standing for long periods of time. Move your legs often if you must stand in one place for a long time.  Avoid heavy lifting.  Wear low-heeled shoes and practice good posture.  You may continue to have sex unless your health care provider tells you not to. Relieving pain and discomfort  Wear a good support bra to relieve breast tenderness.  Take warm sitz baths to soothe any pain or discomfort caused by hemorrhoids. Use hemorrhoid cream if your health care provider approves.  Rest with your legs elevated if you have leg cramps or low back pain.  If you develop   varicose veins in your legs, wear support hose. Elevate your feet for 15 minutes, 3-4 times a day. Limit salt in your diet. Prenatal care  Schedule your prenatal visits by the twelfth week of pregnancy. They are usually scheduled monthly at first, then more often in the last 2 months before delivery.  Write down your questions. Take them to your prenatal visits.  Keep all your prenatal visits as told by your health care provider. This is important. Safety  Wear your seat belt at all times when driving.  Make a list of emergency phone numbers, including numbers for family, friends, the hospital, and police and fire departments. General instructions  Ask your health care provider for a referral to a local prenatal education class. Begin classes no later than the beginning of month 6 of your pregnancy.  Ask for help if you have counseling or nutritional needs during pregnancy. Your health care provider can offer advice or refer you to specialists for help  with various needs.  Do not use hot tubs, steam rooms, or saunas.  Do not douche or use tampons or scented sanitary pads.  Do not cross your legs for long periods of time.  Avoid cat litter boxes and soil used by cats. These carry germs that can cause birth defects in the baby and possibly loss of the fetus by miscarriage or stillbirth.  Avoid all smoking, herbs, alcohol, and medicines not prescribed by your health care provider. Chemicals in these products affect the formation and growth of the baby.  Do not use any products that contain nicotine or tobacco, such as cigarettes and e-cigarettes. If you need help quitting, ask your health care provider. You may receive counseling support and other resources to help you quit.  Schedule a dentist appointment. At home, brush your teeth with a soft toothbrush and be gentle when you floss. Contact a health care provider if:  You have dizziness.  You have mild pelvic cramps, pelvic pressure, or nagging pain in the abdominal area.  You have persistent nausea, vomiting, or diarrhea.  You have a bad smelling vaginal discharge.  You have pain when you urinate.  You notice increased swelling in your face, hands, legs, or ankles.  You are exposed to fifth disease or chickenpox.  You are exposed to German measles (rubella) and have never had it. Get help right away if:  You have a fever.  You are leaking fluid from your vagina.  You have spotting or bleeding from your vagina.  You have severe abdominal cramping or pain.  You have rapid weight gain or loss.  You vomit blood or material that looks like coffee grounds.  You develop a severe headache.  You have shortness of breath.  You have any kind of trauma, such as from a fall or a car accident. Summary  The first trimester of pregnancy is from week 1 until the end of week 13 (months 1 through 3).  Your body goes through many changes during pregnancy. The changes vary from  woman to woman.  You will have routine prenatal visits. During those visits, your health care provider will examine you, discuss any test results you may have, and talk with you about how you are feeling. This information is not intended to replace advice given to you by your health care provider. Make sure you discuss any questions you have with your health care provider. Document Released: 12/15/2000 Document Revised: 12/03/2015 Document Reviewed: 12/03/2015 Elsevier Interactive Patient Education  2017 [employer]  

## 2016-11-24 LAB — RPR+RH+ABO+RUB AB+AB SCR+CB...
ANTIBODY SCREEN: NEGATIVE
HEMATOCRIT: 42.5 % (ref 34.0–46.6)
HIV SCREEN 4TH GENERATION: NONREACTIVE
Hemoglobin: 14.8 g/dL (ref 11.1–15.9)
Hepatitis B Surface Ag: NEGATIVE
MCH: 31.8 pg (ref 26.6–33.0)
MCHC: 34.8 g/dL (ref 31.5–35.7)
MCV: 91 fL (ref 79–97)
Platelets: 280 10*3/uL (ref 150–379)
RBC: 4.66 x10E6/uL (ref 3.77–5.28)
RDW: 13 % (ref 12.3–15.4)
RH TYPE: POSITIVE
RPR: NONREACTIVE
Varicella zoster IgG: 763 index (ref >165)
WBC: 9.1 10*3/uL (ref 3.4–10.8)

## 2016-11-25 LAB — URINE CULTURE

## 2016-11-25 LAB — URINE DRUG PANEL 7
Amphetamines, Urine: NEGATIVE ng/mL
Barbiturate Quant, Ur: NEGATIVE ng/mL
Benzodiazepine Quant, Ur: NEGATIVE ng/mL
COCAINE (METAB.): NEGATIVE ng/mL
Cannabinoid Quant, Ur: NEGATIVE ng/mL
OPIATE QUANT UR: NEGATIVE ng/mL
PCP Quant, Ur: NEGATIVE ng/mL

## 2016-11-25 LAB — GC/CHLAMYDIA PROBE AMP
Chlamydia trachomatis, NAA: NEGATIVE
Neisseria gonorrhoeae by PCR: NEGATIVE

## 2016-11-27 ENCOUNTER — Emergency Department: Payer: Commercial Managed Care - PPO

## 2016-11-27 ENCOUNTER — Encounter: Payer: Self-pay | Admitting: Emergency Medicine

## 2016-11-27 ENCOUNTER — Other Ambulatory Visit: Payer: Self-pay

## 2016-11-27 ENCOUNTER — Emergency Department
Admission: EM | Admit: 2016-11-27 | Discharge: 2016-11-27 | Disposition: A | Discharge status: Home / Self Care | Payer: Commercial Managed Care - PPO | Attending: Emergency Medicine | Admitting: Emergency Medicine

## 2016-11-27 DIAGNOSIS — O4690 Antepartum hemorrhage, unspecified, unspecified trimester: Secondary | ICD-10-CM

## 2016-11-27 DIAGNOSIS — N390 Urinary tract infection, site not specified: Secondary | ICD-10-CM

## 2016-11-27 DIAGNOSIS — O2311 Infections of bladder in pregnancy, first trimester: Secondary | ICD-10-CM | POA: Diagnosis not present

## 2016-11-27 DIAGNOSIS — Z79899 Other long term (current) drug therapy: Secondary | ICD-10-CM | POA: Insufficient documentation

## 2016-11-27 DIAGNOSIS — R110 Nausea: Secondary | ICD-10-CM

## 2016-11-27 DIAGNOSIS — O200 Threatened abortion: Secondary | ICD-10-CM | POA: Insufficient documentation

## 2016-11-27 DIAGNOSIS — R1030 Lower abdominal pain, unspecified: Secondary | ICD-10-CM | POA: Insufficient documentation

## 2016-11-27 DIAGNOSIS — O9989 Other specified diseases and conditions complicating pregnancy, childbirth and the puerperium: Secondary | ICD-10-CM | POA: Diagnosis not present

## 2016-11-27 DIAGNOSIS — Z3A01 Less than 8 weeks gestation of pregnancy: Secondary | ICD-10-CM | POA: Diagnosis not present

## 2016-11-27 DIAGNOSIS — O209 Hemorrhage in early pregnancy, unspecified: Secondary | ICD-10-CM | POA: Diagnosis present

## 2016-11-27 LAB — COMPREHENSIVE METABOLIC PANEL
ALT: 19 U/L (ref 14–54)
AST: 19 U/L (ref 15–41)
Albumin: 4.4 g/dL (ref 3.5–5.0)
Alkaline Phosphatase: 37 U/L — ABNORMAL LOW (ref 38–126)
Anion gap: 15 (ref 5–15)
BUN: 11 mg/dL (ref 6–20)
CHLORIDE: 104 mmol/L (ref 101–111)
CO2: 17 mmol/L — ABNORMAL LOW (ref 22–32)
CREATININE: 0.67 mg/dL (ref 0.44–1.00)
Calcium: 9.5 mg/dL (ref 8.9–10.3)
GFR calc Af Amer: >60 mL/min (ref >60)
GLUCOSE: 84 mg/dL (ref 65–99)
Potassium: 4 mmol/L (ref 3.5–5.1)
Sodium: 136 mmol/L (ref 135–145)
TOTAL PROTEIN: 8 g/dL (ref 6.5–8.1)
Total Bilirubin: 1.5 mg/dL — ABNORMAL HIGH (ref 0.3–1.2)

## 2016-11-27 LAB — URINALYSIS, COMPLETE (UACMP) WITH MICROSCOPIC
Bilirubin Urine: NEGATIVE
Glucose, UA: NEGATIVE mg/dL
KETONES UR: 80 mg/dL — AB
LEUKOCYTES UA: NEGATIVE
Nitrite: NEGATIVE
PROTEIN: 100 mg/dL — AB
Specific Gravity, Urine: 1.031 — ABNORMAL HIGH (ref 1.005–1.030)
pH: 5 (ref 5.0–8.0)

## 2016-11-27 LAB — CBC
HCT: 45.7 % (ref 35.0–47.0)
Hemoglobin: 15.8 g/dL (ref 12.0–16.0)
MCH: 31.4 pg (ref 26.0–34.0)
MCHC: 34.6 g/dL (ref 32.0–36.0)
MCV: 90.8 fL (ref 80.0–100.0)
PLATELETS: 284 10*3/uL (ref 150–440)
RBC: 5.03 MIL/uL (ref 3.80–5.20)
RDW: 12.9 % (ref 11.5–14.5)
WBC: 12 10*3/uL — AB (ref 3.6–11.0)

## 2016-11-27 LAB — HCG, QUANTITATIVE, PREGNANCY: HCG, BETA CHAIN, QUANT, S: 111550 m[IU]/mL — AB (ref <5)

## 2016-11-27 LAB — LIPASE, BLOOD: Lipase: 18 U/L (ref 11–51)

## 2016-11-27 LAB — POCT PREGNANCY, URINE: Preg Test, Ur: POSITIVE — AB

## 2016-11-27 MED ORDER — SODIUM CHLORIDE 0.9 % IV BOLUS (SEPSIS)
1000.0000 mL | Freq: Once | INTRAVENOUS | Status: AC
  Administered 2016-11-27: 1000 mL via INTRAVENOUS

## 2016-11-27 MED ORDER — ONDANSETRON HCL 4 MG/2ML IJ SOLN
4.0000 mg | Freq: Once | INTRAMUSCULAR | Status: AC
  Administered 2016-11-27: 4 mg via INTRAVENOUS
  Filled 2016-11-27: qty 2

## 2016-11-27 MED ORDER — PROMETHAZINE HCL 25 MG/ML IJ SOLN
25.0000 mg | Freq: Once | INTRAMUSCULAR | Status: AC
  Administered 2016-11-27: 25 mg via INTRAVENOUS
  Filled 2016-11-27: qty 1

## 2016-11-27 MED ORDER — NITROFURANTOIN MONOHYD MACRO 100 MG PO CAPS: 100.0000 mg | ORAL_CAPSULE | Freq: Two times a day (BID) | ORAL | 0 refills | Status: AC

## 2016-11-27 MED ORDER — ONDANSETRON HCL 4 MG/2ML IJ SOLN
4.0000 mg | Freq: Once | INTRAMUSCULAR | Status: AC | PRN
  Administered 2016-11-27: 4 mg via INTRAVENOUS
  Filled 2016-11-27: qty 2

## 2016-11-27 MED ORDER — PROMETHAZINE HCL 25 MG PO TABS: 25.0000 mg | ORAL_TABLET | Freq: Four times a day (QID) | ORAL | 0 refills | Status: DC | PRN

## 2016-11-27 MED ORDER — ONDANSETRON 4 MG PO TBDP: 4.0000 mg | ORAL_TABLET | Freq: Three times a day (TID) | ORAL | 0 refills | Status: DC | PRN

## 2016-11-27 MED ORDER — PROMETHAZINE HCL 25 MG RE SUPP: 25.0000 mg | Freq: Four times a day (QID) | RECTAL | 1 refills | Status: DC | PRN

## 2016-11-27 NOTE — ED Notes (Signed)
Pt states she is currently being treated for vaginal yeast infection and thinks she may also have UTI d/t burning with urination. Pt states she gets yeast infections very easily and does not use pads at all because of this.

## 2016-11-27 NOTE — ED Provider Notes (Signed)
[facility] Emergency Department Provider Note  Time seen: 12:35 PM  I have reviewed the triage vital signs and the nursing notes.   HISTORY  Chief Complaint Abdominal Pain and Vaginal Bleeding    HPI Stephanie Hayden is a 26 y.o. female G2 P1 with a past medical history of endometriosis, dysmenorrhea, presents the emergency department [redacted] weeks pregnant with vaginal bleeding and cramping.  According to the patient she saw her OB on Tuesday had an ultrasound performed in the office which showed a single live pregnancy at 7 weeks per patient.  She states on Thursday she developed vaginal bleeding and lower abdominal cramping which has continued until today.  The patient was concerned that she could be having a miscarriage so she came to the emergency department for evaluation.  Patient states over the past 1 week she has been having severe nausea with frequent episodes of vomiting and occasional diarrhea.  States a history of significant nausea with her first pregnancy as well requiring Zofran and Phenergan use.  Patient was prescribed likely just by her OB but states it is not covered under her insurance plan and would be greater than $600 per month.  Patient has been taking Unisom/B6 but with minimal improvement.  Patient also states on review of systems dysuria over the past 1 week.  Past Medical History:  Diagnosis Date  . Endometriosis   . Family history of breast cancer    genetic testing letter sent 5/18  . Phlebitis following infusion    LEFT ARM-AFTER HAVING WISDOME TEETH TAKEN OUT IN APRIL 2017  . Urinary tract infection     Patient Active Problem List   Diagnosis Date Noted  . Supervision of other normal pregnancy, antepartum 11/23/2016  . Menorrhagia with regular cycle 09/28/2016  . Weight gain, abnormal 09/28/2016  . Dysmenorrhea 09/28/2016  . Headache syndrome 01/19/2016  . Endometriosis 06/06/2015  . Abdominal pain in female 06/06/2015    Past  Surgical History:  Procedure Laterality Date  . ABLATION ON ENDOMETRIOSIS  2017  . CESAREAN SECTION  2011  . CHROMOPERTUBATION N/A 06/06/2015   Procedure: CHROMOPERTUBATION;  Surgeon: Gae Dry, MD;  Location: [facility] ORS;  Service: Gynecology;  Laterality: N/A;  . LAPAROSCOPY N/A 06/06/2015   Procedure: LAPAROSCOPY OPERATIVE/ EXCISION OF ENDOMETRIOSIS;  Surgeon: Gae Dry, MD;  Location: [facility] ORS;  Service: Gynecology;  Laterality: N/A;  . TONSILLECTOMY    . WISDOM TOOTH EXTRACTION  04-2015    Prior to Admission medications   Medication Sig Start Date End Date Taking? Authorizing Provider  clotrimazole-betamethasone (LOTRISONE) cream Apply 1 application topically 2 (two) times daily. 10/19/16   Gae Dry, MD  diphenhydramine-acetaminophen (TYLENOL PM) 25-500 MG TABS tablet Take 1 tablet by mouth at bedtime as needed.    [provider]  Doxylamine-Pyridoxine (DICLEGIS) 10-10 MG TBEC Take 2 tablets by mouth at bedtime. If symptoms persist, add one tablet in the morning and one in the afternoon 11/23/16   Rexene Agent, CNM  terconazole (TERAZOL 7) 0.4 % vaginal cream Place 1 applicator vaginally at bedtime for 7 days. 11/23/16 11/30/16  Rexene Agent, CNM    Allergies  Allergen Reactions  . Coconut Flavor Anaphylaxis  . Morphine And Related Hives and Swelling    Family History  Problem Relation Age of Onset  . Hypertension Mother   . Diabetes Mother   . Migraines Mother   . Hypertension Father   . Diabetes Father   . Brain  cancer Paternal Grandfather   . Brain cancer Cousin   . Breast cancer Paternal Aunt 25  . Breast cancer Paternal Aunt 77  . Breast cancer Paternal Aunt 42    Social History Social History   Tobacco Use  . Smoking status: Never Smoker  . Smokeless tobacco: Never Used  Substance Use Topics  . Alcohol use: No  . Drug use: No    Review of Systems Constitutional: Negative for fever. Cardiovascular: Negative for chest  pain. Respiratory: Negative for shortness of breath. Gastrointestinal: Lower abdominal cramping.  Positive for nausea and vomiting.  Occasional diarrhea. Genitourinary: Positive for vaginal bleeding, mild dysuria Neurological: Negative for headache All other ROS negative  ____________________________________________   PHYSICAL EXAM:  VITAL SIGNS: ED Triage Vitals  Enc Vitals Group     BP 11/27/16 1051 (!) 143/77     Pulse Rate 11/27/16 1051 78     Resp 11/27/16 1051 20     Temp 11/27/16 1051 98.2 F (36.8 C)     Temp Source 11/27/16 1051 Oral     SpO2 11/27/16 1051 100 %     Weight 11/27/16 1052 180 lb (81.6 kg)     Height 11/27/16 1052 5\' 7"  (1.702 m)     Head Circumference --      Peak Flow --      Pain Score 11/27/16 1051 5     Pain Loc --      Pain Edu? --      Excl. in [place]? --    Constitutional: Alert and oriented. Well appearing and in no distress. Eyes: Normal exam ENT   Head: Normocephalic and atraumatic.   Mouth/Throat: Mucous membranes are moist. Cardiovascular: Normal rate, regular rhythm. No murmur Respiratory: Normal respiratory effort without tachypnea nor retractions. Breath sounds are clear Gastrointestinal: Soft, no significant tenderness palpation.  No rebound or guarding.  No distention. Musculoskeletal: Nontender with normal range of motion in all extremities.  Neurologic:  Normal speech and language. No gross focal neurologic deficits  Skin:  Skin is warm, dry and intact.  Psychiatric: Mood and affect are normal. Speech and behavior are normal.   ____________________________________________    RADIOLOGY  Ultrasound shows single live IUP 6 weeks 6 days small subchorionic hemorrhage.  ____________________________________________   INITIAL IMPRESSION / ASSESSMENT AND PLAN / ED COURSE  Pertinent labs & imaging results that were available during my care of the patient were reviewed by me and considered in my medical decision making (see  chart for details).  Department of vaginal bleeding approximately [redacted] weeks pregnant.  Differential would include threatened miscarriage, completed miscarriage, subchorionic hemorrhage, less likely ectopic.  We will check labs, obtain an ultrasound to further evaluate.  I have reviewed the patient's records she has an A+ blood type documented.  We will IV hydrate while in the emergency department.  Ultrasound consistent with live IUP 6 weeks 6 days, subchorionic hematoma noted.  We will discharge the patient with OB follow-up.    Patient's urinalysis does show ketones as well as possible urinary tract infection, I have added on a urine culture.  On reevaluation patient continues to state significant nausea attempting to eat ice chips.  I discussed the risk and benefits of antiemetic use such as Phenergan patient states she required Phenergan with her previous pregnancy.  We will dose Phenergan as well as a second liter of fluids.  If the patient feels like she could be discharged home at that time we will discharge with Phenergan tablets  as well as suppository form as well as ODT Zofran and OB follow-up.  Patient CARE signed out to oncoming physician. ____________________________________________   FINAL CLINICAL IMPRESSION(S) / ED DIAGNOSES  Threatened miscarriage Nausea and vomiting Subchorionic hemorrhage.   Harvest Dark, MD 11/27/16 1524

## 2016-11-27 NOTE — ED Triage Notes (Addendum)
R lower abdominal pain and vag bleeding x 4 days. Nausea, vomiting and diarrhea for same time.  States had [place] in office this week and showed a single intrauterine pregnancy. Sees [facility]. States returned from trip to [place] week and half ago.

## 2016-11-28 LAB — URINE CULTURE: CULTURE: NO GROWTH

## 2016-12-03 ENCOUNTER — Encounter: Payer: Commercial Managed Care - PPO | Admitting: Maternal Newborn

## 2016-12-06 ENCOUNTER — Other Ambulatory Visit: Payer: Self-pay

## 2016-12-06 ENCOUNTER — Encounter: Payer: Commercial Managed Care - PPO | Admitting: Obstetrics and Gynecology

## 2016-12-06 NOTE — Telephone Encounter (Signed)
Pt states she went to ER last weekend & was given Zofran. She is out. Pt requesting refill. [phone]

## 2016-12-07 ENCOUNTER — Other Ambulatory Visit: Payer: Self-pay | Admitting: Obstetrics & Gynecology

## 2016-12-07 MED ORDER — ONDANSETRON 4 MG PO TBDP: 4.0000 mg | ORAL_TABLET | Freq: Three times a day (TID) | ORAL | 0 refills | Status: DC | PRN

## 2016-12-07 NOTE — Telephone Encounter (Signed)
ERx [facility] for Zofran

## 2016-12-21 ENCOUNTER — Ambulatory Visit (INDEPENDENT_AMBULATORY_CARE_PROVIDER_SITE_OTHER): Payer: Commercial Managed Care - PPO | Admitting: Obstetrics and Gynecology

## 2016-12-21 VITALS — BP 100/60 | Wt 174.0 lb

## 2016-12-21 DIAGNOSIS — R1110 Vomiting, unspecified: Secondary | ICD-10-CM

## 2016-12-21 DIAGNOSIS — Z1379 Encounter for other screening for genetic and chromosomal anomalies: Secondary | ICD-10-CM

## 2016-12-21 DIAGNOSIS — Z3480 Encounter for supervision of other normal pregnancy, unspecified trimester: Secondary | ICD-10-CM

## 2016-12-21 DIAGNOSIS — Z3A10 10 weeks gestation of pregnancy: Secondary | ICD-10-CM

## 2016-12-21 MED ORDER — PROMETHAZINE HCL 25 MG PO TABS: 25.0000 mg | ORAL_TABLET | Freq: Four times a day (QID) | ORAL | 0 refills | Status: DC | PRN

## 2016-12-21 MED ORDER — DOCUSATE SODIUM 100 MG PO CAPS: 100.0000 mg | ORAL_CAPSULE | Freq: Two times a day (BID) | ORAL | 2 refills | Status: DC | PRN

## 2016-12-21 MED ORDER — ONDANSETRON 4 MG PO TBDP: 4.0000 mg | ORAL_TABLET | Freq: Three times a day (TID) | ORAL | 0 refills | Status: DC | PRN

## 2016-12-21 MED ORDER — PYRIDOXINE HCL 25 MG PO TABS: 25.0000 mg | ORAL_TABLET | Freq: Four times a day (QID) | ORAL | 3 refills | Status: DC | PRN

## 2016-12-21 NOTE — Progress Notes (Signed)
Routine Prenatal Care Visit  Subjective  Stephanie Hayden is a 26 y.o. G2P1001 at [redacted]w[redacted]d being seen today for ongoing prenatal care.  She is currently monitored for the following issues for this low-risk pregnancy and has Endometriosis; Abdominal pain in female; Headache syndrome; Menorrhagia with regular cycle; Weight gain, abnormal; Dysmenorrhea; and Supervision of other normal pregnancy, antepartum on their problem list.  ----------------------------------------------------------------------------------- Patient reports severe nausea and vomiting. She reports total 8lb weight loss this pregnancy. She was seen in the emergency room several weeks ago and initiated zofran and phenergan. Her vomiting is controlled with these medications but she is having issues with constipation, only having bowel movements when taking laxatives about twice a week. She has B6 at home but has not been taking it in addition to the zofran.   Contractions: Not present. Vag. Bleeding: None.  Movement: Absent. Denies leaking of fluid.  ----------------------------------------------------------------------------------- The following portions of the patient's history were reviewed and updated as appropriate: allergies, current medications, past family history, past medical history, past social history, past surgical history and problem list. Problem list updated.   Objective  Blood pressure 100/60, weight 174 lb (78.9 kg), last menstrual period 10/11/2016. Pregravid weight 190 lb (86.2 kg) Total Weight Gain  (-7.258 kg) Urinalysis: Urine Protein: Negative Urine Glucose: Negative  Fetal Status: Fetal Heart Rate (bpm): 160   Movement: Absent     General:  Alert, oriented and cooperative. Patient is in no acute distress.  Skin: Skin is warm and dry. No rash noted.   Cardiovascular: Normal heart rate noted  Respiratory: Normal respiratory effort, no problems with respiration noted  Abdomen: Soft, gravid, appropriate  for gestational age. Pain/Pressure: Absent     Pelvic:  Cervical exam deferred        Extremities: Normal range of motion.  Edema: None  ental Status: Normal mood and affect. Normal behavior. Normal judgment and thought content.     Assessment   26 y.o. G2P1001 at [redacted]w[redacted]d by  07/18/2017, by Last Menstrual Period presenting for routine prenatal visit  Plan   second Problems (from 11/23/16 to present)    Problem Noted Resolved   Supervision of other normal pregnancy, antepartum 11/23/2016 by Rexene Agent, CNM No   Overview Addendum 12/21/2016 10:29 AM by Homero Fellers, MD      Clinic [facility] Prenatal Labs  Dating  EDC=T1 [place] Blood type: A/Positive/-- (11/20 1158)   Genetic Screen 1 Screen:   Desires  AFP:      Quad:      NIPS:    Antibody:Negative (11/20 1158)  Anatomic [place]  Rubella: <0.90 (11/20 1158)  Varicella: Immune  GTT Early:        28 wk:      RPR: Non Reactive (11/20 1158)   Rhogam  Not needed HBsAg: Negative (11/20 1158)   TDaP vaccine                       HIV:   Negative  Flu Shot                                GBS:   Contraception  Pap:  CBB     CS/VBAC    Baby Food    Support Person                 Preterm labor symptoms and general obstetric precautions including but not  limited to vaginal bleeding, contractions, leaking of fluid and fetal movement were reviewed in detail with the patient. Please refer to After Visit Summary for other counseling recommendations.   Prescriptions refilled. Encouraged patient to minimize Zofran use and start daily colace.   Return in about 2 weeks (around 01/04/2017) for NT scan and ROB.   Pt c/o severe nausea and vomiting. Requests refill for zofran and promethazine.

## 2017-01-07 ENCOUNTER — Ambulatory Visit (INDEPENDENT_AMBULATORY_CARE_PROVIDER_SITE_OTHER): Payer: Commercial Managed Care - PPO | Admitting: Advanced Practice Midwife

## 2017-01-07 ENCOUNTER — Encounter: Payer: Self-pay | Admitting: Advanced Practice Midwife

## 2017-01-07 ENCOUNTER — Ambulatory Visit (INDEPENDENT_AMBULATORY_CARE_PROVIDER_SITE_OTHER): Payer: Commercial Managed Care - PPO

## 2017-01-07 VITALS — BP 114/70 | Wt 176.0 lb

## 2017-01-07 DIAGNOSIS — Z3682 Encounter for antenatal screening for nuchal translucency: Secondary | ICD-10-CM

## 2017-01-07 DIAGNOSIS — Z3A12 12 weeks gestation of pregnancy: Secondary | ICD-10-CM

## 2017-01-07 DIAGNOSIS — R1110 Vomiting, unspecified: Secondary | ICD-10-CM

## 2017-01-07 DIAGNOSIS — Z1379 Encounter for other screening for genetic and chromosomal anomalies: Secondary | ICD-10-CM

## 2017-01-07 MED ORDER — ONDANSETRON 4 MG PO TBDP: 4.0000 mg | ORAL_TABLET | Freq: Three times a day (TID) | ORAL | 0 refills | Status: DC | PRN

## 2017-01-07 NOTE — Progress Notes (Signed)
Routine Prenatal Care Visit  Subjective  Stephanie Hayden is a 27 y.o. G2P1001 at [redacted]w[redacted]d being seen today for ongoing prenatal care.  She is currently monitored for the following issues for this low-risk pregnancy and has Endometriosis; Abdominal pain in female; Headache syndrome; Menorrhagia with regular cycle; Weight gain, abnormal; Dysmenorrhea; and Supervision of other normal pregnancy, antepartum on their problem list.  ----------------------------------------------------------------------------------- Patient reports constipation that is relieved with Ex-lax. She says colace did not help her. She admits to increasing fiber and h2o intake and to being active at her job. Recommend adding probiotics to her diet and ok for occasional use of Miralax. She is also requesting a refill of zofran.  Contractions: Not present. Vag. Bleeding: None.  Movement: Absent. Denies leaking of fluid.  ----------------------------------------------------------------------------------- The following portions of the patient's history were reviewed and updated as appropriate: allergies, current medications, past family history, past medical history, past social history, past surgical history and problem list. Problem list updated.   Objective  Blood pressure 114/70, weight 176 lb (79.8 kg), last menstrual period 10/11/2016. Pregravid weight 190 lb (86.2 kg) Total Weight Gain  (-6.35 kg) Urinalysis: Urine Protein: Negative Urine Glucose: Negative  Fetal Status: Fetal Heart Rate (bpm): 161   Movement: Absent     1st trimester screen today. NT scan wnl.  General:  Alert, oriented and cooperative. Patient is in no acute distress.  Skin: Skin is warm and dry. No rash noted.   Cardiovascular: Normal heart rate noted  Respiratory: Normal respiratory effort, no problems with respiration noted  Abdomen: Soft, gravid, appropriate for gestational age. Pain/Pressure: Absent     Pelvic:  Cervical exam deferred          Extremities: Normal range of motion.  Edema: None  Mental Status: Normal mood and affect. Normal behavior. Normal judgment and thought content.   Assessment   27 y.o. G2P1001 at [redacted]w[redacted]d by  07/18/2017, by Last Menstrual Period presenting for routine prenatal visit  Plan   second Problems (from 11/23/16 to present)    Problem Noted Resolved   Supervision of other normal pregnancy, antepartum 11/23/2016 by Rexene Agent, CNM No   Overview Addendum 12/21/2016 10:29 AM by Homero Fellers, MD      Clinic [facility] Prenatal Labs  Dating  EDC=T1 [place] Blood type: A/Positive/-- (11/20 1158)   Genetic Screen 1 Screen:   Desires  AFP:      Quad:      NIPS:    Antibody:Negative (11/20 1158)  Anatomic [place]  Rubella: <0.90 (11/20 1158)  Varicella: Immune  GTT Early:        28 wk:      RPR: Non Reactive (11/20 1158)   Rhogam  Not needed HBsAg: Negative (11/20 1158)   TDaP vaccine                       HIV:   Negative  Flu Shot                                GBS:   Contraception  Pap:  CBB     CS/VBAC Hx of c/s   [place]    Support Person                 Preterm labor symptoms and general obstetric precautions including but not limited to vaginal bleeding, contractions, leaking of fluid and fetal movement were reviewed  in detail with the patient. Please refer to After Visit Summary for other counseling recommendations.   Return in about 4 weeks (around 02/04/2017) for rob.  Rod Can, CNM  01/07/2017 3:16 PM

## 2017-01-07 NOTE — Patient Instructions (Signed)

## 2017-01-07 NOTE — Progress Notes (Signed)
NT screen today. No vb. No lof. Pt has had to take so much Zofran that she has been constipated. Has tried Unisom and B6 with no relief.

## 2017-01-11 LAB — FIRST TRIMESTER SCREEN W/NT
CRL: 68.8 mm
DIA MOM: 0.93
DIA Value: 191.2 pg/mL
GEST AGE-COLLECT: 12.9 wk
Maternal Age At EDD: 28.7 yr
NUCHAL TRANSLUCENCY: 1.2 mm
NUMBER OF FETUSES: 1
Nuchal Translucency MoM: 0.7
PAPP-A MoM: 1.34
PAPP-A VALUE: 1246 ng/mL
TEST RESULTS: NEGATIVE
Weight: 176 [lb_av]
hCG MoM: 1.19
hCG Value: 89.1 IU/mL

## 2017-01-18 ENCOUNTER — Telehealth: Payer: Self-pay

## 2017-01-18 ENCOUNTER — Other Ambulatory Visit: Payer: Self-pay | Admitting: Maternal Newborn

## 2017-01-18 DIAGNOSIS — B373 Candidiasis of vulva and vagina: Secondary | ICD-10-CM

## 2017-01-18 DIAGNOSIS — B3731 Acute candidiasis of vulva and vagina: Secondary | ICD-10-CM

## 2017-01-18 MED ORDER — TERCONAZOLE 0.4 % VA CREA: 1.0000 | TOPICAL_CREAM | Freq: Every day | VAGINAL | 1 refills | Status: DC

## 2017-01-18 NOTE — Progress Notes (Signed)
Sent Rx for terconazole per patient request for symptoms of vulvovaginal candidiasis.  Avel Sensor, CNM 01/18/2017  3:20 PM

## 2017-01-18 NOTE — Telephone Encounter (Signed)
Spoke w/pt who states she was last treated in November by Deeann Saint w/Lotrisone cream. Provider had advised that this may become a recurring problem thru pregnancy. Pharmacy [facility] [place]

## 2017-01-18 NOTE — Telephone Encounter (Signed)
12 wk OB states she has another yeast infection. Requesting an additional rx for the cream or recommendation of what else she should do/use. GZ#[phone]

## 2017-01-26 ENCOUNTER — Other Ambulatory Visit: Payer: Self-pay | Admitting: Advanced Practice Midwife

## 2017-01-26 DIAGNOSIS — R1110 Vomiting, unspecified: Secondary | ICD-10-CM

## 2017-02-01 ENCOUNTER — Ambulatory Visit (INDEPENDENT_AMBULATORY_CARE_PROVIDER_SITE_OTHER): Payer: Commercial Managed Care - PPO | Admitting: Obstetrics and Gynecology

## 2017-02-01 VITALS — BP 120/74 | Wt 172.0 lb

## 2017-02-01 DIAGNOSIS — Z98891 History of uterine scar from previous surgery: Secondary | ICD-10-CM

## 2017-02-01 DIAGNOSIS — Z3A16 16 weeks gestation of pregnancy: Secondary | ICD-10-CM

## 2017-02-01 DIAGNOSIS — Z3480 Encounter for supervision of other normal pregnancy, unspecified trimester: Secondary | ICD-10-CM

## 2017-02-01 NOTE — Progress Notes (Signed)
Routine Prenatal Care Visit  Subjective  Stephanie Hayden is a 27 y.o. G2P1001 at [redacted]w[redacted]d being seen today for ongoing prenatal care.  She is currently monitored for the following issues for this high-risk pregnancy and has Endometriosis; Abdominal pain in female; Headache syndrome; Menorrhagia with regular cycle; Weight gain, abnormal; Dysmenorrhea; Supervision of other normal pregnancy, antepartum; and History of cesarean section on their problem list.  ----------------------------------------------------------------------------------- Patient reports mild right sided cramping.  She had no vaginal bleeding.  She has a home doppler unit and she was unable to find a fetal heart beat.  Contractions: Not present. Vag. Bleeding: None.  Movement: Absent. Denies leaking of fluid.  ----------------------------------------------------------------------------------- The following portions of the patient's history were reviewed and updated as appropriate: allergies, current medications, past family history, past medical history, past social history, past surgical history and problem list. Problem list updated.  Objective  Blood pressure 120/74, weight 172 lb (78 kg), last menstrual period 10/11/2016. Pregravid weight 190 lb (86.2 kg) Total Weight Gain  (-8.165 kg) Urinalysis:      Fetal Status: Fetal Heart Rate (bpm): Present   Movement: Absent     General:  Alert, oriented and cooperative. Patient is in no acute distress.  Skin: Skin is warm and dry. No rash noted.   Cardiovascular: Normal heart rate noted  Respiratory: Normal respiratory effort, no problems with respiration noted  Abdomen: Soft, gravid, appropriate for gestational age. Pain/Pressure: Present     Pelvic:  Cervical exam deferred        Extremities: Normal range of motion.     Mental Status: Normal mood and affect. Normal behavior. Normal judgment and thought content.   Bedside ultrasound performed: viable fetus noted with normal  cardiac activity and multiple movements noted throughout exam.   Assessment   27 y.o. G2P1001 at [redacted]w[redacted]d by  07/18/2017, by Last Menstrual Period presenting for work-in prenatal visit  Plan   second Problems (from 11/23/16 to present)    Problem Noted Resolved   History of cesarean section 02/01/2017 by Will Bonnet, MD No   Supervision of other normal pregnancy, antepartum 11/23/2016 by Rexene Agent, CNM No   Overview Addendum 12/21/2016 10:29 AM by Homero Fellers, MD      Clinic [facility] Prenatal Labs  Dating  EDC=T1 [place] Blood type: A/Positive/-- (11/20 1158)   Genetic Screen 1 Screen:   Desires  AFP:      Quad:      NIPS:    Antibody:Negative (11/20 1158)  Anatomic [place]  Rubella: <0.90 (11/20 1158)  Varicella: Immune  GTT Early:        28 wk:      RPR: Non Reactive (11/20 1158)   Rhogam  Not needed HBsAg: Negative (11/20 1158)   TDaP vaccine                       HIV:   Negative  Flu Shot                                GBS:   Contraception  Pap:  CBB     CS/VBAC    Baby Food    Support Person                 Please refer to After Visit Summary for other counseling recommendations.  -Patient reassured that fetus is still viable. No evidence of [place]. No vaginal  bleeding.  - Precautions for pain on right side (e.g. Appendicitis, etc), including; worsening pain, fevers, chills, nausea, vomiting, diarrhea, vaginal bleeding.  Return if symptoms worsen or fail to improve, for Keep previously scheduled routine prenatal appointment.  Prentice Docker, MD  02/02/2017 11:11 AM

## 2017-02-02 ENCOUNTER — Encounter: Payer: Self-pay | Admitting: Obstetrics and Gynecology

## 2017-02-04 ENCOUNTER — Encounter: Payer: Self-pay | Admitting: Obstetrics and Gynecology

## 2017-02-04 ENCOUNTER — Ambulatory Visit (INDEPENDENT_AMBULATORY_CARE_PROVIDER_SITE_OTHER): Payer: Commercial Managed Care - PPO | Admitting: Obstetrics and Gynecology

## 2017-02-04 VITALS — BP 118/76 | Wt 175.0 lb

## 2017-02-04 DIAGNOSIS — Z3A16 16 weeks gestation of pregnancy: Secondary | ICD-10-CM

## 2017-02-04 DIAGNOSIS — Z98891 History of uterine scar from previous surgery: Secondary | ICD-10-CM

## 2017-02-04 DIAGNOSIS — Z3480 Encounter for supervision of other normal pregnancy, unspecified trimester: Secondary | ICD-10-CM

## 2017-02-04 NOTE — Progress Notes (Signed)
Routine Prenatal Care Visit  Subjective  Stephanie Hayden is a 27 y.o. G2P1001 at [redacted]w[redacted]d being seen today for ongoing prenatal care.  She is currently monitored for the following issues for this high-risk pregnancy and has Endometriosis; Abdominal pain in female; Headache syndrome; Menorrhagia with regular cycle; Weight gain, abnormal; Dysmenorrhea; Supervision of other normal pregnancy, antepartum; and History of cesarean section on their problem list.  ----------------------------------------------------------------------------------- Patient reports: continued constipation.  RLQ pain unchanged from last visit.  No nausea, emesis, diarreha. No fevers, chills. Contractions: Not present. Vag. Bleeding: None.  Movement: Absent. Denies leaking of fluid.  ----------------------------------------------------------------------------------- The following portions of the patient's history were reviewed and updated as appropriate: allergies, current medications, past family history, past medical history, past social history, past surgical history and problem list. Problem list updated.  Objective  Blood pressure 118/76, weight 175 lb (79.4 kg), last menstrual period 10/11/2016. Pregravid weight 190 lb (86.2 kg) Total Weight Gain  (-6.804 kg) Urinalysis: Urine Protein: Negative Urine Glucose: Negative  Fetal Status: Fetal Heart Rate (bpm): 150   Movement: Absent     General:  Alert, oriented and cooperative. Patient is in no acute distress.  Skin: Skin is warm and dry. No rash noted.   Cardiovascular: Normal heart rate noted  Respiratory: Normal respiratory effort, no problems with respiration noted  Abdomen: Soft, gravid, appropriate for gestational age. Pain/Pressure: Present     Pelvic:  Cervical exam deferred        Extremities: Normal range of motion.     Mental Status: Normal mood and affect. Normal behavior. Normal judgment and thought content.   Assessment   27 y.o. G2P1001 at [redacted]w[redacted]d by   07/18/2017, by Last Menstrual Period presenting for routine prenatal visit  Plan   second Problems (from 11/23/16 to present)    Problem Noted Resolved   History of cesarean section 02/01/2017 by Will Bonnet, MD No   Supervision of other normal pregnancy, antepartum 11/23/2016 by Rexene Agent, CNM No   Overview Addendum 12/21/2016 10:29 AM by Homero Fellers, MD      Clinic [facility] Prenatal Labs  Dating  EDC=T1 [place] Blood type: A/Positive/-- (11/20 1158)   Genetic Screen 1 Screen:   Desires  AFP:      Quad:      NIPS:    Antibody:Negative (11/20 1158)  Anatomic [place]  Rubella: <0.90 (11/20 1158)  Varicella: Immune  GTT Early:        28 wk:      RPR: Non Reactive (11/20 1158)   Rhogam  Not needed HBsAg: Negative (11/20 1158)   TDaP vaccine                       HIV:   Negative  Flu Shot                                GBS:   Contraception  Pap:  CBB     CS/VBAC    Baby Food    Support Person                Please refer to After Visit Summary for other counseling recommendations.  -msAFP offered. Patient to consider and will call to schedule test, if desires  Return in about 3 weeks (around 02/25/2017) for anatomy u/s and routine prenatal. also may make appt for msAFP lab draw.  Prentice Docker, MD  02/04/2017  4:25 PM

## 2017-02-06 ENCOUNTER — Telehealth: Payer: Self-pay | Admitting: Obstetrics and Gynecology

## 2017-02-06 NOTE — Telephone Encounter (Signed)
Patient called because of cough, nausea, body aches, and temperature. Patient likely has viral illness. Asked her to go to the ER or urgent care to have a flu swab performed so that if it was positive she could start anitvirals. Supportive care measures discussed. Recommended hydration, rest, and tylenol. Patient can take robitussin and mucinex as needed.

## 2017-02-07 ENCOUNTER — Telehealth: Payer: Self-pay | Admitting: Maternal Newborn

## 2017-02-07 NOTE — Telephone Encounter (Signed)
Pt states she has had fever the last couple days, coughing a lot, is hoarse, body aches.  Has appt at primary are at 8:50.  Adv that is where she needed to be as we cannot test for the flu.

## 2017-02-07 NOTE — Telephone Encounter (Signed)
Pt is calling to let us know she believes she has the Flu. Please advise

## 2017-02-08 ENCOUNTER — Telehealth: Payer: Self-pay

## 2017-02-08 NOTE — Telephone Encounter (Signed)
Pt has been sick with the flu since Saturday c high fever as well.  Wanted to know if that would harm the baby.  Adv I wasn't aware but she needed to try to get the fever down.  She is currently taking regular tylenol.  Adv to take e.s. Tylenol q4h while awake, cold compresses, drink water.  Adv if unable to get fever down to go to ED.

## 2017-02-23 ENCOUNTER — Other Ambulatory Visit: Payer: Self-pay | Admitting: Advanced Practice Midwife

## 2017-02-23 DIAGNOSIS — R1110 Vomiting, unspecified: Secondary | ICD-10-CM

## 2017-02-24 ENCOUNTER — Encounter: Payer: Commercial Managed Care - PPO | Admitting: Maternal Newborn

## 2017-02-24 ENCOUNTER — Other Ambulatory Visit: Payer: Commercial Managed Care - PPO

## 2017-02-24 ENCOUNTER — Other Ambulatory Visit: Payer: Self-pay | Admitting: Obstetrics and Gynecology

## 2017-02-24 DIAGNOSIS — R1110 Vomiting, unspecified: Secondary | ICD-10-CM

## 2017-02-24 NOTE — Telephone Encounter (Signed)
Pt is calling needing an refill on her Zofran. Please advise

## 2017-02-25 MED ORDER — ONDANSETRON 4 MG PO TBDP: ORAL_TABLET | ORAL | 1 refills | Status: DC

## 2017-02-25 NOTE — Telephone Encounter (Signed)
ERx done

## 2017-03-09 ENCOUNTER — Other Ambulatory Visit: Payer: Commercial Managed Care - PPO

## 2017-03-09 ENCOUNTER — Ambulatory Visit (INDEPENDENT_AMBULATORY_CARE_PROVIDER_SITE_OTHER): Payer: Commercial Managed Care - PPO

## 2017-03-09 ENCOUNTER — Encounter: Payer: Commercial Managed Care - PPO | Admitting: Advanced Practice Midwife

## 2017-03-09 ENCOUNTER — Ambulatory Visit (INDEPENDENT_AMBULATORY_CARE_PROVIDER_SITE_OTHER): Payer: Commercial Managed Care - PPO | Admitting: Obstetrics and Gynecology

## 2017-03-09 VITALS — BP 100/60 | Wt 179.0 lb

## 2017-03-09 DIAGNOSIS — IMO0002 Reserved for concepts with insufficient information to code with codable children: Secondary | ICD-10-CM

## 2017-03-09 DIAGNOSIS — Z0489 Encounter for examination and observation for other specified reasons: Secondary | ICD-10-CM

## 2017-03-09 DIAGNOSIS — Z3480 Encounter for supervision of other normal pregnancy, unspecified trimester: Secondary | ICD-10-CM | POA: Diagnosis not present

## 2017-03-09 DIAGNOSIS — Z98891 History of uterine scar from previous surgery: Secondary | ICD-10-CM

## 2017-03-09 DIAGNOSIS — Z3A21 21 weeks gestation of pregnancy: Secondary | ICD-10-CM

## 2017-03-09 NOTE — Patient Instructions (Addendum)
27 y.o. G2P1001 at [redacted]w[redacted]d with Estimated Date of Delivery: 07/18/17 was seen today in office to discuss trial of labor after cesarean section (TOLAC) versus elective repeat cesarean delivery (ERCD). The following risks were discussed with the patient.  Risk of uterine rupture at term is 0.78 percent with TOLAC and 0.22 percent with ERCD. 1 in 10 uterine ruptures will result in neonatal death or neurological injury. The benefits of a trial of labor after cesarean (TOLAC) resulting in a vaginal birth after cesarean (VBAC) include the following: shorter length of hospital stay and postpartum recovery (in most cases); fewer complications, such as postpartum fever, wound or uterine infection, thromboembolism (blood clots in the leg or lung), need for blood transfusion and fewer neonatal breathing problems. The risks of an attempted VBAC or TOLAC include the following: Risk of failed trial of labor after cesarean (TOLAC) without a vaginal birth after cesarean (VBAC) resulting in repeat cesarean delivery (RCD) in about 20 to 62 percent of women who attempt VBAC.  Her individualized success rate using the MFMU VBAC risk calculator is 72.8%.   Risk of rupture of uterus resulting in an emergency cesarean delivery. The risk of uterine rupture may be related in part to the type of uterine incision made during the first cesarean delivery. A previous transverse uterine incision has the lowest risk of rupture (0.2 to 1.5 percent risk). Vertical or T-shaped uterine incisions have a higher risk of uterine rupture (4 to 9 percent risk)The risk of fetal death is very low with both VBAC and elective repeat cesarean delivery (ERCD), but the likelihood of fetal death is higher with VBAC than with ERCD. Maternal death is very rare with either type of delivery. The risks of an elective repeat cesarean delivery (ERCD) were reviewed with the patient including but not limited to: 02/998 risk of uterine rupture which could have serious  consequences, bleeding which may require transfusion; infection which may require antibiotics; injury to bowel, bladder or other surrounding organs (bowel, bladder, ureters); injury to the fetus; need for additional procedures including hysterectomy in the event of a life-threatening hemorrhage; thromboembolic phenomenon; abnormal placentation; incisional problems; death and other postoperative or anesthesia complications.    In addition we discussed that our collective office practice is to allow patient's who desire to attempt TOLAC to go into labor naturally.  There is some limited data that rupture rate may increase past [redacted] weeks gestation, but it is reasonable for women who are strongly committed to [facility] to continue pregnancy into the 41st week.  Medical indications necessetating early delivery may arise during the course of any pregnancy.  Given the contraindication on the use of prostaglandins for use in cervical ripening,  recommendation would be to proceed with repeat cesarean for delivery for patient's with unfavorable cervix (low Bishops score) who reach 41 weeks or who otherwise have a medical indication for early delivery.   These risks and benefits are summarized on the consent form, which was reviewed with the patient during the visit.  All her questions answered and she signed a consent indicating a preference for TOLAC/ERCD. A copy of the consent was given to the patient.  MFMU VBAC calculator success rate of 72.8 with 95% CI of 70.7% to 74.8%.  The patient was counseled regarding recommendation for elective repeat cesarean section (ERCS) given that she has failed to go into spontaneous labor by [redacted]w[redacted]d gestation and has an unfavorable cervix.  Previous studies examining have noted an increased risk or maternal and fetal morbidity  for patient attempting a trial of labor whose success rated is <70% compared to ERCS, while morbidity was similar for patient attempting a trial of labor vs ERCS with  success rates >70. ("Can a prediction model for vaginal birth after cesarean section also predict the probablity of  Morbidity related to a trial of labor?" American Jourcal of Obstetric and Gynecology 2009  January)

## 2017-03-09 NOTE — Progress Notes (Signed)
No concerns.rj 

## 2017-03-09 NOTE — Progress Notes (Signed)
Routine Prenatal Care Visit  Subjective  Stephanie Hayden is a 27 y.o. G2P1001 at [redacted]w[redacted]d being seen today for ongoing prenatal care.  She is currently monitored for the following issues for this low-risk pregnancy and has Endometriosis; Abdominal pain in female; Headache syndrome; Menorrhagia with regular cycle; Weight gain, abnormal; Dysmenorrhea; Supervision of other normal pregnancy, antepartum; and History of cesarean section on their problem list.  ----------------------------------------------------------------------------------- Patient reports no complaints.   Contractions: Not present. Vag. Bleeding: None.  Movement: Present. Denies leaking of fluid.  ----------------------------------------------------------------------------------- The following portions of the patient's history were reviewed and updated as appropriate: allergies, current medications, past family history, past medical history, past social history, past surgical history and problem list. Problem list updated.   Objective  Blood pressure 100/60, weight 179 lb (81.2 kg), last menstrual period 10/11/2016. Pregravid weight 190 lb (86.2 kg) Total Weight Gain  (-4.99 kg) Urinalysis: Urine Protein: Negative Urine Glucose: Negative  Fetal Status: Fetal Heart Rate (bpm): 150   Movement: Present     General:  Alert, oriented and cooperative. Patient is in no acute distress.  Skin: Skin is warm and dry. No rash noted.   Cardiovascular: Normal heart rate noted  Respiratory: Normal respiratory effort, no problems with respiration noted  Abdomen: Soft, gravid, appropriate for gestational age. Pain/Pressure: Present     Pelvic:  Cervical exam deferred        Extremities: Normal range of motion.     ental Status: Normal mood and affect. Normal behavior. Normal judgment and thought content.     Assessment   27 y.o. G2P1001 at [redacted]w[redacted]d by  07/18/2017, by Last Menstrual Period presenting for routine prenatal visit  Plan    second Problems (from 11/23/16 to present)    Problem Noted Resolved   History of cesarean section 02/01/2017 by Will Bonnet, MD No   Supervision of other normal pregnancy, antepartum 11/23/2016 by Rexene Agent, CNM No   Overview Addendum 12/21/2016 10:29 AM by Homero Fellers, MD      Clinic [facility] Prenatal Labs  Dating  EDC=T1 [place] Blood type: A/Positive/-- (11/20 1158)   Genetic Screen 1 Screen:   Desires  AFP:      Quad:      NIPS:    Antibody:Negative (11/20 1158)  Anatomic [place]  Rubella: <0.90 (11/20 1158)  Varicella: Immune  GTT Early:        28 wk:      RPR: Non Reactive (11/20 1158)   Rhogam  Not needed HBsAg: Negative (11/20 1158)   TDaP vaccine                       HIV:   Negative  Flu Shot                                GBS:   Contraception  Pap:  CBB     CS/VBAC    Baby Food    Support Person                 Gestational age appropriate obstetric precautions including but not limited to vaginal bleeding, contractions, leaking of fluid and fetal movement were reviewed in detail with the patient.   - ANATOMY SCAN INCOMPLETE RVOT AND LVOT  27 y.o. G2P1001 at [redacted]w[redacted]d with Estimated Date of Delivery: 07/18/17 was seen today in office to discuss trial of labor after cesarean  section (TOLAC) versus elective repeat cesarean delivery (ERCD). The following risks were discussed with the patient.  Risk of uterine rupture at term is 0.78 percent with TOLAC and 0.22 percent with ERCD. 1 in 10 uterine ruptures will result in neonatal death or neurological injury. The benefits of a trial of labor after cesarean (TOLAC) resulting in a vaginal birth after cesarean (VBAC) include the following: shorter length of hospital stay and postpartum recovery (in most cases); fewer complications, such as postpartum fever, wound or uterine infection, thromboembolism (blood clots in the leg or lung), need for blood transfusion and fewer neonatal breathing problems. The risks of  an attempted VBAC or TOLAC include the following: Risk of failed trial of labor after cesarean (TOLAC) without a vaginal birth after cesarean (VBAC) resulting in repeat cesarean delivery (RCD) in about 20 to 17 percent of women who attempt VBAC.  Her individualized success rate using the MFMU VBAC risk calculator is 72.8%.   Risk of rupture of uterus resulting in an emergency cesarean delivery. The risk of uterine rupture may be related in part to the type of uterine incision made during the first cesarean delivery. A previous transverse uterine incision has the lowest risk of rupture (0.2 to 1.5 percent risk). Vertical or T-shaped uterine incisions have a higher risk of uterine rupture (4 to 9 percent risk)The risk of fetal death is very low with both VBAC and elective repeat cesarean delivery (ERCD), but the likelihood of fetal death is higher with VBAC than with ERCD. Maternal death is very rare with either type of delivery. The risks of an elective repeat cesarean delivery (ERCD) were reviewed with the patient including but not limited to: 02/998 risk of uterine rupture which could have serious consequences, bleeding which may require transfusion; infection which may require antibiotics; injury to bowel, bladder or other surrounding organs (bowel, bladder, ureters); injury to the fetus; need for additional procedures including hysterectomy in the event of a life-threatening hemorrhage; thromboembolic phenomenon; abnormal placentation; incisional problems; death and other postoperative or anesthesia complications.    In addition we discussed that our collective office practice is to allow patient's who desire to attempt TOLAC to go into labor naturally.  There is some limited data that rupture rate may increase past [redacted] weeks gestation, but it is reasonable for women who are strongly committed to [facility] to continue pregnancy into the 41st week.  Medical indications necessetating early delivery may arise during  the course of any pregnancy.  Given the contraindication on the use of prostaglandins for use in cervical ripening,  recommendation would be to proceed with repeat cesarean for delivery for patient's with unfavorable cervix (low Bishops score) who reach 41 weeks or who otherwise have a medical indication for early delivery.   These risks and benefits are summarized on the consent form, which was reviewed with the patient during the visit.  All her questions answered and she signed a consent indicating a preference for TOLAC/ERCD. A copy of the consent was given to the patient.  MFMU VBAC calculator success rate of 72.8 with 95% CI of 70.7% to 74.8%.  The patient was counseled regarding recommendation for elective repeat cesarean section (ERCS) given that she has failed to go into spontaneous labor by [redacted]w[redacted]d gestation and has an unfavorable cervix.  Previous studies examining have noted an increased risk or maternal and fetal morbidity for patient attempting a trial of labor whose success rated is <70% compared to [facility], while morbidity was similar for patient attempting  a trial of labor vs ERCS with success rates >70. ("Can a prediction model for vaginal birth after cesarean section also predict the probablity of  Morbidity related to a trial of labor?" American Jourcal of Obstetric and Gynecology 2009  January)  Return in about 3 weeks (around 03/30/2017) for rob [place].  Malachy Mood, MD, [facility], [facility] 03/09/2017, 5:40 PM

## 2017-04-06 ENCOUNTER — Ambulatory Visit
Admission: RE | Admit: 2017-04-06 | Discharge: 2017-04-06 | Disposition: A | Discharge status: Home / Self Care | Payer: Commercial Managed Care - PPO | Source: Ambulatory Visit | Attending: Obstetrics and Gynecology | Admitting: Obstetrics and Gynecology

## 2017-04-06 ENCOUNTER — Ambulatory Visit (INDEPENDENT_AMBULATORY_CARE_PROVIDER_SITE_OTHER): Payer: Commercial Managed Care - PPO | Admitting: Obstetrics and Gynecology

## 2017-04-06 ENCOUNTER — Ambulatory Visit (INDEPENDENT_AMBULATORY_CARE_PROVIDER_SITE_OTHER): Payer: Commercial Managed Care - PPO

## 2017-04-06 VITALS — BP 126/68 | Wt 186.0 lb

## 2017-04-06 DIAGNOSIS — M7989 Other specified soft tissue disorders: Secondary | ICD-10-CM | POA: Diagnosis present

## 2017-04-06 DIAGNOSIS — Z0489 Encounter for examination and observation for other specified reasons: Secondary | ICD-10-CM | POA: Diagnosis not present

## 2017-04-06 DIAGNOSIS — Z3480 Encounter for supervision of other normal pregnancy, unspecified trimester: Secondary | ICD-10-CM

## 2017-04-06 DIAGNOSIS — Z3A00 Weeks of gestation of pregnancy not specified: Secondary | ICD-10-CM | POA: Diagnosis not present

## 2017-04-06 DIAGNOSIS — IMO0002 Reserved for concepts with insufficient information to code with codable children: Secondary | ICD-10-CM

## 2017-04-06 DIAGNOSIS — O26893 Other specified pregnancy related conditions, third trimester: Secondary | ICD-10-CM | POA: Insufficient documentation

## 2017-04-06 DIAGNOSIS — Z3483 Encounter for supervision of other normal pregnancy, third trimester: Secondary | ICD-10-CM

## 2017-04-06 DIAGNOSIS — Z3A24 24 weeks gestation of pregnancy: Secondary | ICD-10-CM

## 2017-04-06 DIAGNOSIS — Z98891 History of uterine scar from previous surgery: Secondary | ICD-10-CM

## 2017-04-06 NOTE — Progress Notes (Signed)
ROB U/S today Feet swelling Acid reflux

## 2017-04-06 NOTE — Progress Notes (Signed)
Routine Prenatal Care Visit  Subjective  Stephanie Hayden is a 27 y.o. G2P1001 at [redacted]w[redacted]d being seen today for ongoing prenatal care.  She is currently monitored for the following issues for this low-risk pregnancy and has Endometriosis; Abdominal pain in female; Headache syndrome; Menorrhagia with regular cycle; Weight gain, abnormal; Dysmenorrhea; Supervision of other normal pregnancy, antepartum; and History of cesarean section on their problem list.  ----------------------------------------------------------------------------------- Patient reports no complaints.   Contractions: Not present. Vag. Bleeding: None.  Movement: Present. Denies leaking of fluid.  ----------------------------------------------------------------------------------- The following portions of the patient's history were reviewed and updated as appropriate: allergies, current medications, past family history, past medical history, past social history, past surgical history and problem list. Problem list updated.   Objective  Blood pressure 126/68, weight 186 lb (84.4 kg), last menstrual period 10/11/2016. Pregravid weight 190 lb (86.2 kg) Total Weight Gain -4 lb (-1.814 kg) Urinalysis: Urine Protein: Negative Urine Glucose: Negative  Fetal Status: Fetal Heart Rate (bpm): 150   Movement: Present     General:  Alert, oriented and cooperative. Patient is in no acute distress.  Skin: Skin is warm and dry. No rash noted.   Cardiovascular: Normal heart rate noted  Respiratory: Normal respiratory effort, no problems with respiration noted  Abdomen: Soft, gravid, appropriate for gestational age. Pain/Pressure: Present     Pelvic:  Cervical exam deferred        Extremities: Normal range of motion.   Both extremities measure 38cm, some increased edema in left foot  ental Status: Normal mood and affect. Normal behavior. Normal judgment and thought content.   US Ob Comp + 14 Wk  Result Date: 03/10/2017 ULTRASOUND REPORT  Location: [facility] Date of Service: 03/09/2017 Indications:Anatomy U/S Findings: [name] intrauterine pregnancy is visualized with FHR at 144 BPM. Biometrics give an (U/S) Gestational age of [redacted]w[redacted]d and an (U/S) EDD of 07/18/17; this correlates with the clinically established Estimated Date of Delivery: 07/18/17 Fetal presentation is Cephalic. EFW: 413 grams (0lb 15oz). Placenta: Anterior and grade 1. AFI: WNL subjectively. Anatomic survey is incomplete for views of RVOT and LVOT and normal; Gender - female.  Right Ovary is normal in appearance. Left Ovary is surgically absent. Survey of the adnexa demonstrates no adnexal masses. There is no free peritoneal fluid in the cul de sac. Impression: 1. [redacted]w[redacted]d Viable Singleton Intrauterine pregnancy by U/S. 2. (U/S) EDD is consistent with Clinically established Estimated Date of Delivery: 07/18/17 . 3. Incomplete Anatomy Scan - Views of RVOT and LVOT are needed to complete exam. Recommendations: 1.Clinical correlation with the patient's History and Physical Exam. 2. F/U U/S recommended in 3-4 weeks to complete anatomical survey. Dario Ave, RDMS Review of ULTRASOUND. I have personally reviewed images and report of recent ultrasound done at [facility]. There is a singleton gestation with subjectively normal amniotic fluid volume. The fetal biometry correlates with established dating. Detailed evaluation of the fetal anatomy was performed.The fetal anatomical survey appears within normal limits within the resolution of ultrasound as described above.  It must be noted that a normal ultrasound is unable to rule out fetal aneuploidy.  Barnett Applebaum, MD, [facility], [facility] 03/10/2017  3:47 PM   US Ob Follow Up  Result Date: 04/06/2017 ULTRASOUND REPORT Location: [facility] Date of Service: 04/06/2017 Indications:F/U Anatomy Findings: [name] intrauterine pregnancy is visualized with FHR at 125 BPM. Biometrics give an (U/S) Gestational age of [redacted]w[redacted]d  and an (U/S) EDD of 07/18/17; this correlates with the  clinically established EDD of Estimated Date of Delivery: 07/18/17. Fetal presentation is Cephalic. Placenta: Anterior and grade 1. AFI: subjectively normal. Anatomic survey is complete. Survey of the adnexa demonstrates no adnexal masses. There is no free peritoneal fluid in the cul de sac. Impression: 1. [redacted]w[redacted]d Viable Singleton Intrauterine pregnancy previously established criteria. 2. Normal Anatomy Scan is now complete Recommendations: 1.Clinical correlation with the patient's History and Physical Exam. Dario Ave, RDMS There is a singleton gestation with subjectively normal amniotic fluid volume.  Limited evaluation of the fetal anatomy was performed today, focusing on on anatomic structures not fully visualized at the time of prior study.The visualized fetal anatomical survey appears within normal limits within the resolution of ultrasound as described above, and the anatomic survey is now complete.  It must be noted that a normal ultrasound is unable to rule out fetal aneuploidy.  Malachy Mood, MD, [facility], [facility] 04/06/2017, 3:43 PM   US Venous Img Lower Unilateral Left  Result Date: 04/06/2017 CLINICAL DATA:  Left lower extremity pain and edema for the past 5 days. Patient is currently [redacted] weeks pregnant. Evaluate for DVT. EXAM: LEFT LOWER EXTREMITY VENOUS DOPPLER ULTRASOUND TECHNIQUE: Gray-scale sonography with graded compression, as well as color Doppler and duplex ultrasound were performed to evaluate the lower extremity deep venous systems from the level of the common femoral vein and including the common femoral, femoral, profunda femoral, popliteal and calf veins including the posterior tibial, peroneal and gastrocnemius veins when visible. The superficial great saphenous vein was also interrogated. Spectral Doppler was utilized to evaluate flow at rest and with distal augmentation maneuvers in the common femoral,  femoral and popliteal veins. COMPARISON:  None. FINDINGS: Contralateral Common Femoral Vein: Respiratory phasicity is normal and symmetric with the symptomatic side. No evidence of thrombus. Normal compressibility. Common Femoral Vein: No evidence of thrombus. Normal compressibility, respiratory phasicity and response to augmentation. Saphenofemoral Junction: No evidence of thrombus. Normal compressibility and flow on color Doppler imaging. Profunda Femoral Vein: No evidence of thrombus. Normal compressibility and flow on color Doppler imaging. Femoral Vein: No evidence of thrombus. Normal compressibility, respiratory phasicity and response to augmentation. Popliteal Vein: No evidence of thrombus. Normal compressibility, respiratory phasicity and response to augmentation. Calf Veins: No evidence of thrombus. Normal compressibility and flow on color Doppler imaging. Superficial Great Saphenous Vein: No evidence of thrombus. Normal compressibility. Venous Reflux:  None. Other Findings:  None. IMPRESSION: No evidence of DVT within the left lower extremity. Electronically Signed   By: Sandi Mariscal M.D.   On: 04/06/2017 16:56     Assessment   27 y.o. G2P1001 at [redacted]w[redacted]d by  07/18/2017, by Last Menstrual Period presenting for routine prenatal visit  Plan   second Problems (from 11/23/16 to present)    Problem Noted Resolved   History of cesarean section 02/01/2017 by Will Bonnet, MD No   Supervision of other normal pregnancy, antepartum 11/23/2016 by Rexene Agent, CNM No   Overview Addendum 03/09/2017  5:39 PM by Malachy Mood, MD      Clinic [facility] Prenatal Labs  Dating  EDC=T1 [place] Blood type: A/Positive/-- (11/20 1158)   Genetic Screen 1 Screen: negative  Antibody:Negative (11/20 1158)  Anatomic [place] Female, incomplete RVOT and LVOT [X]  complete at 24 weeks Rubella: <0.90 (11/20 1158)  Varicella: Immune  GTT Early:        28 wk:      RPR: Non Reactive (11/20 1158)   Rhogam  Not needed  HBsAg: Negative (11/20 1158)   TDaP vaccine                       HIV:   Negative  Flu Shot                                GBS:   Contraception  Pap: 04/27/16 NIL  CBB     CS/VBAC    Baby Food    Support Person                 Gestational age appropriate obstetric precautions including but not limited to vaginal bleeding, contractions, leaking of fluid and fetal movement were reviewed in detail with the patient.   - venous doppler left lower extremity - 28 week labs next visit  Return in about 1 month (around 05/04/2017) for ROB and 28 week labs.  Malachy Mood, MD, [facility], [facility] 04/06/2017, 9:59 PM

## 2017-04-14 ENCOUNTER — Other Ambulatory Visit: Payer: Self-pay | Admitting: Obstetrics & Gynecology

## 2017-04-14 DIAGNOSIS — R1110 Vomiting, unspecified: Secondary | ICD-10-CM

## 2017-04-15 ENCOUNTER — Ambulatory Visit (INDEPENDENT_AMBULATORY_CARE_PROVIDER_SITE_OTHER): Payer: Commercial Managed Care - PPO | Admitting: Maternal Newborn

## 2017-04-15 ENCOUNTER — Encounter: Payer: Self-pay | Admitting: Maternal Newborn

## 2017-04-15 VITALS — BP 110/70 | Wt 186.0 lb

## 2017-04-15 DIAGNOSIS — Z3480 Encounter for supervision of other normal pregnancy, unspecified trimester: Secondary | ICD-10-CM

## 2017-04-15 DIAGNOSIS — R55 Syncope and collapse: Secondary | ICD-10-CM

## 2017-04-15 NOTE — Progress Notes (Signed)
C/o dizziness for the last week/week and a half - everything goes black/dizzy, ears ring, heart beats fast.  Dizziness isn't constant, come and goes.rj

## 2017-04-15 NOTE — Progress Notes (Signed)
Prenatal Problem Visit  Subjective  Stephanie Hayden is a 27 y.o. G2P1001 at [redacted]w[redacted]d being seen today for ongoing prenatal care.  She is currently monitored for the following issues for this low-risk pregnancy and has Endometriosis; Abdominal pain in female; Headache syndrome; Menorrhagia with regular cycle; Weight gain, abnormal; Dysmenorrhea; Supervision of other normal pregnancy, antepartum; and History of cesarean section on their problem list.  ----------------------------------------------------------------------------------- Patient reports dizziness and syncope. She has had three separate episodes of feeling hot, increased pulse, darkness in her vision, and extreme dizziness in the past two weeks. Syncope this morning after she got in the shower.  Contractions: Not present. Vag. Bleeding: None.  Movement: Present. Denies leaking of fluid.  ----------------------------------------------------------------------------------- The following portions of the patient's history were reviewed and updated as appropriate: allergies, current medications, past family history, past medical history, past social history, past surgical history and problem list. Problem list updated.   Objective  Blood pressure 110/70, weight 186 lb (84.4 kg), last menstrual period 10/11/2016. Pregravid weight 190 lb (86.2 kg) Total Weight Gain -4 lb (-1.814 kg)  Fetal Status: Fetal Heart Rate (bpm): 148   Movement: Present     General:  Alert, oriented and cooperative. Patient is in no acute distress.  Skin: Skin is warm and dry. No rash noted.   Cardiovascular: Normal heart rate noted  Respiratory: Normal respiratory effort, no problems with respiration noted  Abdomen: Soft, gravid, appropriate for gestational age. Pain/Pressure: Absent     Pelvic:  Cervical exam deferred        Extremities: Normal range of motion.     Mental Status: Normal mood and affect. Normal behavior. Normal judgment and thought content.      Assessment   27 y.o. G2P1001 at [redacted]w[redacted]d, EDD 07/18/2017 by Last Menstrual Period presenting for a prenatal problem visit.  Plan   second Problems (from 11/23/16 to present)    Problem Noted Resolved   History of cesarean section 02/01/2017 by Will Bonnet, MD No   Supervision of other normal pregnancy, antepartum 11/23/2016 by Rexene Agent, CNM No   Overview Addendum 04/06/2017  9:58 PM by Malachy Mood, MD      [facility] Prenatal Labs  Dating  EDC=T1 [place] Blood type: A/Positive/-- (11/20 1158)   Genetic Screen 1 Screen: negative  Antibody:Negative (11/20 1158)  Anatomic [place] Female, incomplete RVOT and LVOT [X} complete at 24 weeks Rubella: <0.90 (11/20 1158)  Varicella: Immune  GTT Early:        28 wk:      RPR: Non Reactive (11/20 1158)   Rhogam  Not needed HBsAg: Negative (11/20 1158)   TDaP vaccine                       HIV:   Negative  Flu Shot                                GBS:   Contraception  Pap: 04/27/16 NIL  CBB     CS/VBAC    Baby Food    Support Person                She has been drinking plenty of water, changing positions slowly, avoiding overheating. She has also checked her blood sugar and blood pressure and has not been hypo- or hyperglycemic, and not hypotensive. Pulse rate 140 during one episode.  Check H&H today,  iron samples given. Discuss other possible etiologies with MD if lab results normal.  Preterm labor symptoms and general obstetric precautions were reviewed.  Keep scheduled ROB on 05/04/2017.  Avel Sensor, CNM 04/15/2017  3:14 PM

## 2017-04-16 LAB — HEMOGLOBIN AND HEMATOCRIT, BLOOD
HEMATOCRIT: 37.6 % (ref 34.0–46.6)
HEMOGLOBIN: 12.4 g/dL (ref 11.1–15.9)

## 2017-04-18 ENCOUNTER — Telehealth: Payer: Self-pay

## 2017-04-18 NOTE — Telephone Encounter (Signed)
Pt called on call service stating she was seen on 04/15/17 & has not gotten her lab results. She is dizzy and had hemoglobin/iron checked.

## 2017-05-04 ENCOUNTER — Other Ambulatory Visit: Payer: Commercial Managed Care - PPO

## 2017-05-04 ENCOUNTER — Ambulatory Visit (INDEPENDENT_AMBULATORY_CARE_PROVIDER_SITE_OTHER): Payer: Commercial Managed Care - PPO | Admitting: Obstetrics and Gynecology

## 2017-05-04 VITALS — BP 108/74 | Wt 192.0 lb

## 2017-05-04 DIAGNOSIS — Z98891 History of uterine scar from previous surgery: Secondary | ICD-10-CM

## 2017-05-04 DIAGNOSIS — Z3A29 29 weeks gestation of pregnancy: Secondary | ICD-10-CM

## 2017-05-04 DIAGNOSIS — Z3483 Encounter for supervision of other normal pregnancy, third trimester: Secondary | ICD-10-CM

## 2017-05-04 DIAGNOSIS — Z3480 Encounter for supervision of other normal pregnancy, unspecified trimester: Secondary | ICD-10-CM

## 2017-05-04 NOTE — Progress Notes (Signed)
ROB GTT C/O vaginal pressure

## 2017-05-04 NOTE — Progress Notes (Signed)
Routine Prenatal Care Visit  Subjective  Stephanie Hayden is a 27 y.o. G2P1001 at [redacted]w[redacted]d being seen today for ongoing prenatal care.  She is currently monitored for the following issues for this low-risk pregnancy and has Endometriosis; Abdominal pain in female; Headache syndrome; Menorrhagia with regular cycle; Weight gain, abnormal; Dysmenorrhea; Supervision of other normal pregnancy, antepartum; and History of cesarean section on their problem list.  ----------------------------------------------------------------------------------- Patient reports no complaints.   Contractions: Not present. Vag. Bleeding: None.  Movement: Present. Denies leaking of fluid.  ----------------------------------------------------------------------------------- The following portions of the patient's history were reviewed and updated as appropriate: allergies, current medications, past family history, past medical history, past social history, past surgical history and problem list. Problem list updated.   Objective  Blood pressure 108/74, weight 192 lb (87.1 kg), last menstrual period 10/11/2016. Pregravid weight 190 lb (86.2 kg) Total Weight Gain 2 lb (0.907 kg) Urinalysis: Urine Protein: Negative Urine Glucose: Negative  Fetal Status: Fetal Heart Rate (bpm): 135 Fundal Height: 28 cm Movement: Present     General:  Alert, oriented and cooperative. Patient is in no acute distress.  Skin: Skin is warm and dry. No rash noted.   Cardiovascular: Normal heart rate noted  Respiratory: Normal respiratory effort, no problems with respiration noted  Abdomen: Soft, gravid, appropriate for gestational age. Pain/Pressure: Present     Pelvic:  Cervical exam deferred        Extremities: Normal range of motion.     ental Status: Normal mood and affect. Normal behavior. Normal judgment and thought content.     Assessment   27 y.o. G2P1001 at [redacted]w[redacted]d by  07/18/2017, by Last Menstrual Period presenting for routine  prenatal visit  Plan   second Problems (from 11/23/16 to present)    Problem Noted Resolved   History of cesarean section 02/01/2017 by Will Bonnet, MD No   Supervision of other normal pregnancy, antepartum 11/23/2016 by Rexene Agent, CNM No   Overview Addendum 05/04/2017  9:40 AM by Malachy Mood, MD      [facility] Prenatal Labs  Dating  EDC=T1 [place] Blood type: A/Positive/-- (11/20 1158)   Genetic Screen 1 Screen: negative  Antibody:Negative (11/20 1158)  Anatomic [place] Female, incomplete RVOT and LVOT [X]  complete at 24 weeks Rubella: <0.90 (11/20 1158)  Varicella: Immune  GTT      RPR: Non Reactive (11/20 1158)   Rhogam  Not needed HBsAg: Negative (11/20 1158)   TDaP vaccine [ ]  30 weeks                    HIV:   Negative  Flu Shot                                GBS:   Contraception  Pap: 04/27/16 NIL  CBB     CS/TOLAC TOLAC   Baby Food    Support Person                 Gestational age appropriate obstetric precautions including but not limited to vaginal bleeding, contractions, leaking of fluid and fetal movement were reviewed in detail with the patient.   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  Malachy Mood, MD, [facility], [facility] 05/04/2017, 10:37 AM

## 2017-05-05 LAB — 28 WEEK RH+PANEL
BASOS ABS: 0 10*3/uL (ref 0.0–0.2)
Basos: 0 %
EOS (ABSOLUTE): 0.1 10*3/uL (ref 0.0–0.4)
Eos: 1 %
GESTATIONAL DIABETES SCREEN: 100 mg/dL (ref 65–139)
HIV Screen 4th Generation wRfx: NONREACTIVE
Hematocrit: 38.2 % (ref 34.0–46.6)
Hemoglobin: 12.6 g/dL (ref 11.1–15.9)
Immature Grans (Abs): 0.1 10*3/uL (ref 0.0–0.1)
Immature Granulocytes: 1 %
LYMPHS ABS: 1.6 10*3/uL (ref 0.7–3.1)
Lymphs: 17 %
MCH: 29.7 pg (ref 26.6–33.0)
MCHC: 33 g/dL (ref 31.5–35.7)
MCV: 90 fL (ref 79–97)
MONOS ABS: 0.6 10*3/uL (ref 0.1–0.9)
Monocytes: 6 %
NEUTROS ABS: 7 10*3/uL (ref 1.4–7.0)
Neutrophils: 75 %
PLATELETS: 250 10*3/uL (ref 150–379)
RBC: 4.24 x10E6/uL (ref 3.77–5.28)
RDW: 13.1 % (ref 12.3–15.4)
RPR: NONREACTIVE
WBC: 9.4 10*3/uL (ref 3.4–10.8)

## 2017-05-18 ENCOUNTER — Ambulatory Visit (INDEPENDENT_AMBULATORY_CARE_PROVIDER_SITE_OTHER): Payer: Commercial Managed Care - PPO | Admitting: Advanced Practice Midwife

## 2017-05-18 ENCOUNTER — Encounter: Payer: Self-pay | Admitting: Advanced Practice Midwife

## 2017-05-18 VITALS — BP 100/70 | Wt 194.0 lb

## 2017-05-18 DIAGNOSIS — Z23 Encounter for immunization: Secondary | ICD-10-CM

## 2017-05-18 DIAGNOSIS — Z3A31 31 weeks gestation of pregnancy: Secondary | ICD-10-CM

## 2017-05-18 MED ORDER — TETANUS-DIPHTH-ACELL PERTUSSIS 5-2.5-18.5 LF-MCG/0.5 IM SUSP
0.5000 mL | Freq: Once | INTRAMUSCULAR | Status: AC
  Administered 2017-05-18: 0.5 mL via INTRAMUSCULAR

## 2017-05-18 NOTE — Patient Instructions (Signed)
Third Trimester of Pregnancy The third trimester is from week 28 through week 40 (months 7 through 9). The third trimester is a time when the unborn baby (fetus) is growing rapidly. At the end of the ninth month, the fetus is about 20 inches in length and weighs 6-10 pounds. Body changes during your third trimester Your body will continue to go through many changes during pregnancy. The changes vary from woman to woman. During the third trimester:  Your weight will continue to increase. You can expect to gain 25-35 pounds (11-16 kg) by the end of the pregnancy.  You may begin to get stretch marks on your hips, abdomen, and breasts.  You may urinate more often because the fetus is moving lower into your pelvis and pressing on your bladder.  You may develop or continue to have heartburn. This is caused by increased hormones that slow down muscles in the digestive tract.  You may develop or continue to have constipation because increased hormones slow digestion and cause the muscles that push waste through your intestines to relax.  You may develop hemorrhoids. These are swollen veins (varicose veins) in the rectum that can itch or be painful.  You may develop swollen, bulging veins (varicose veins) in your legs.  You may have increased body aches in the pelvis, back, or thighs. This is due to weight gain and increased hormones that are relaxing your joints.  You may have changes in your hair. These can include thickening of your hair, rapid growth, and changes in texture. Some women also have hair loss during or after pregnancy, or hair that feels dry or thin. Your hair will most likely return to normal after your baby is born.  Your breasts will continue to grow and they will continue to become tender. A yellow fluid (colostrum) may leak from your breasts. This is the first milk you are producing for your baby.  Your belly button may stick out.  You may notice more swelling in your hands,  face, or ankles.  You may have increased tingling or numbness in your hands, arms, and legs. The skin on your belly may also feel numb.  You may feel short of breath because of your expanding uterus.  You may have more problems sleeping. This can be caused by the size of your belly, increased need to urinate, and an increase in your body's metabolism.  You may notice the fetus "dropping," or moving lower in your abdomen (lightening).  You may have increased vaginal discharge.  You may notice your joints feel loose and you may have pain around your pelvic bone.  What to expect at prenatal visits You will have prenatal exams every 2 weeks until week 36. Then you will have weekly prenatal exams. During a routine prenatal visit:  You will be weighed to make sure you and the baby are growing normally.  Your blood pressure will be taken.  Your abdomen will be measured to track your baby's growth.  The fetal heartbeat will be listened to.  Any test results from the previous visit will be discussed.  You may have a cervical check near your due date to see if your cervix has softened or thinned (effaced).  You will be tested for Group B streptococcus. This happens between 35 and 37 weeks.  Your health care provider may ask you:  What your birth plan is.  How you are feeling.  If you are feeling the baby move.  If you have had   any abnormal symptoms, such as leaking fluid, bleeding, severe headaches, or abdominal cramping.  If you are using any tobacco products, including cigarettes, chewing tobacco, and electronic cigarettes.  If you have any questions.  Other tests or screenings that may be performed during your third trimester include:  Blood tests that check for low iron levels (anemia).  Fetal testing to check the health, activity level, and growth of the fetus. Testing is done if you have certain medical conditions or if there are problems during the  pregnancy.  Nonstress test (NST). This test checks the health of your baby to make sure there are no signs of problems, such as the baby not getting enough oxygen. During this test, a belt is placed around your belly. The baby is made to move, and its heart rate is monitored during movement.  What is false labor? False labor is a condition in which you feel small, irregular tightenings of the muscles in the womb (contractions) that usually go away with rest, changing position, or drinking water. These are called Braxton Hicks contractions. Contractions may last for hours, days, or even weeks before true labor sets in. If contractions come at regular intervals, become more frequent, increase in intensity, or become painful, you should see your health care provider. What are the signs of labor?  Abdominal cramps.  Regular contractions that start at 10 minutes apart and become stronger and more frequent with time.  Contractions that start on the top of the uterus and spread down to the lower abdomen and back.  Increased pelvic pressure and dull back pain.  A watery or bloody mucus discharge that comes from the vagina.  Leaking of amniotic fluid. This is also known as your "water breaking." It could be a slow trickle or a gush. Let your health care provider know if it has a color or strange odor. If you have any of these signs, call your health care provider right away, even if it is before your due date. Follow these instructions at home: Medicines  Follow your health care provider's instructions regarding medicine use. Specific medicines may be either safe or unsafe to take during pregnancy.  Take a prenatal vitamin that contains at least 600 micrograms (mcg) of folic acid.  If you develop constipation, try taking a stool softener if your health care provider approves. Eating and drinking  Eat a balanced diet that includes fresh fruits and vegetables, whole grains, good sources of protein  such as meat, eggs, or tofu, and low-fat dairy. Your health care provider will help you determine the amount of weight gain that is right for you.  Avoid raw meat and uncooked cheese. These carry germs that can cause birth defects in the baby.  If you have low calcium intake from food, talk to your health care provider about whether you should take a daily calcium supplement.  Eat four or five small meals rather than three large meals a day.  Limit foods that are high in fat and processed sugars, such as fried and sweet foods.  To prevent constipation: ? Drink enough fluid to keep your urine clear or pale yellow. ? Eat foods that are high in fiber, such as fresh fruits and vegetables, whole grains, and beans. Activity  Exercise only as directed by your health care provider. Most women can continue their usual exercise routine during pregnancy. Try to exercise for 30 minutes at least 5 days a week. Stop exercising if you experience uterine contractions.  Avoid heavy   lifting.  Do not exercise in extreme heat or humidity, or at high altitudes.  Wear low-heel, comfortable shoes.  Practice good posture.  You may continue to have sex unless your health care provider tells you otherwise. Relieving pain and discomfort  Take frequent breaks and rest with your legs elevated if you have leg cramps or low back pain.  Take warm sitz baths to soothe any pain or discomfort caused by hemorrhoids. Use hemorrhoid cream if your health care provider approves.  Wear a good support bra to prevent discomfort from breast tenderness.  If you develop varicose veins: ? Wear support pantyhose or compression stockings as told by your healthcare provider. ? Elevate your feet for 15 minutes, 3-4 times a day. Prenatal care  Write down your questions. Take them to your prenatal visits.  Keep all your prenatal visits as told by your health care provider. This is important. Safety  Wear your seat belt at  all times when driving.  Make a list of emergency phone numbers, including numbers for family, friends, the hospital, and police and fire departments. General instructions  Avoid cat litter boxes and soil used by cats. These carry germs that can cause birth defects in the baby. If you have a cat, ask someone to clean the litter box for you.  Do not travel far distances unless it is absolutely necessary and only with the approval of your health care provider.  Do not use hot tubs, steam rooms, or saunas.  Do not drink alcohol.  Do not use any products that contain nicotine or tobacco, such as cigarettes and e-cigarettes. If you need help quitting, ask your health care provider.  Do not use any medicinal herbs or unprescribed drugs. These chemicals affect the formation and growth of the baby.  Do not douche or use tampons or scented sanitary pads.  Do not cross your legs for long periods of time.  To prepare for the arrival of your baby: ? Take prenatal classes to understand, practice, and ask questions about labor and delivery. ? Make a trial run to the hospital. ? Visit the hospital and tour the maternity area. ? Arrange for maternity or paternity leave through employers. ? Arrange for family and friends to take care of pets while you are in the hospital. ? Purchase a rear-facing car seat and make sure you know how to install it in your car. ? Pack your hospital bag. ? Prepare the baby's nursery. Make sure to remove all pillows and stuffed animals from the baby's crib to prevent suffocation.  Visit your dentist if you have not gone during your pregnancy. Use a soft toothbrush to brush your teeth and be gentle when you floss. Contact a health care provider if:  You are unsure if you are in labor or if your water has broken.  You become dizzy.  You have mild pelvic cramps, pelvic pressure, or nagging pain in your abdominal area.  You have lower back pain.  You have persistent  nausea, vomiting, or diarrhea.  You have an unusual or bad smelling vaginal discharge.  You have pain when you urinate. Get help right away if:  Your water breaks before 37 weeks.  You have regular contractions less than 5 minutes apart before 37 weeks.  You have a fever.  You are leaking fluid from your vagina.  You have spotting or bleeding from your vagina.  You have severe abdominal pain or cramping.  You have rapid weight loss or weight gain.    You have shortness of breath with chest pain.  You notice sudden or extreme swelling of your face, hands, ankles, feet, or legs.  Your baby makes fewer than 10 movements in 2 hours.  You have severe headaches that do not go away when you take medicine.  You have vision changes. Summary  The third trimester is from week 28 through week 40, months 7 through 9. The third trimester is a time when the unborn baby (fetus) is growing rapidly.  During the third trimester, your discomfort may increase as you and your baby continue to gain weight. You may have abdominal, leg, and back pain, sleeping problems, and an increased need to urinate.  During the third trimester your breasts will keep growing and they will continue to become tender. A yellow fluid (colostrum) may leak from your breasts. This is the first milk you are producing for your baby.  False labor is a condition in which you feel small, irregular tightenings of the muscles in the womb (contractions) that eventually go away. These are called Braxton Hicks contractions. Contractions may last for hours, days, or even weeks before true labor sets in.  Signs of labor can include: abdominal cramps; regular contractions that start at 10 minutes apart and become stronger and more frequent with time; watery or bloody mucus discharge that comes from the vagina; increased pelvic pressure and dull back pain; and leaking of amniotic fluid. This information is not intended to replace advice  given to you by your health care provider. Make sure you discuss any questions you have with your health care provider. Document Released: 12/15/2000 Document Revised: 05/29/2015 Document Reviewed: 02/22/2012 Elsevier Interactive Patient Education  2017 [employer]  

## 2017-05-18 NOTE — Progress Notes (Signed)
Routine Prenatal Care Visit  Subjective  Stephanie Hayden is a 27 y.o. G2P1001 at [redacted]w[redacted]d being seen today for ongoing prenatal care.  She is currently monitored for the following issues for this high-risk pregnancy and has Endometriosis; Abdominal pain in female; Headache syndrome; Menorrhagia with regular cycle; Weight gain, abnormal; Dysmenorrhea; Supervision of other normal pregnancy, antepartum; and History of cesarean section on their problem list.  ----------------------------------------------------------------------------------- Patient reports sharp vaginal/cervical pain, pressure in her pelvis.  She would like to schedule repeat c/s. She is open to the idea of VBAC if she goes into labor spontaneously prior to surgery. She plans to have her tubes tied. Repeat c/s scheduled for 07/14/2017.  Contractions: Not present. Vag. Bleeding: None.  Movement: Present. Denies leaking of fluid.  ----------------------------------------------------------------------------------- The following portions of the patient's history were reviewed and updated as appropriate: allergies, current medications, past family history, past medical history, past social history, past surgical history and problem list. Problem list updated.   Objective  Blood pressure 100/70, weight 194 lb (88 kg), last menstrual period 10/11/2016. Pregravid weight 190 lb (86.2 kg) Total Weight Gain 4 lb (1.814 kg) Urinalysis: Urine Protein: Negative Urine Glucose: Negative  Fetal Status: Fetal Heart Rate (bpm): 147 Fundal Height: 31 cm Movement: Present     General:  Alert, oriented and cooperative. Patient is in no acute distress.  Skin: Skin is warm and dry. No rash noted.   Cardiovascular: Normal heart rate noted  Respiratory: Normal respiratory effort, no problems with respiration noted  Abdomen: Soft, gravid, appropriate for gestational age. Pain/Pressure: Present     Pelvic:  Cervical exam deferred        Extremities:  Normal range of motion.  Edema: None  Mental Status: Normal mood and affect. Normal behavior. Normal judgment and thought content.   Assessment   27 y.o. G2P1001 at [redacted]w[redacted]d by  07/18/2017, by Last Menstrual Period presenting for routine prenatal visit  Plan   second Problems (from 11/23/16 to present)    Problem Noted Resolved   History of cesarean section 02/01/2017 by Will Bonnet, MD No   Supervision of other normal pregnancy, antepartum 11/23/2016 by Rexene Agent, CNM No   Overview Addendum 05/05/2017  8:52 AM by Malachy Mood, [place]  Dating  EDC=T1 [place] Blood type: A/Positive/-- (11/20 1158)   Genetic Screen 1 Screen: negative  Antibody:Negative (11/20 1158)  Anatomic [place] Female, incomplete RVOT and LVOT [X]  complete at 24 weeks Rubella: <0.90 (11/20 1158)  Varicella: Immune  GTT 100   RPR: Non Reactive (11/20 1158)   Rhogam  Not needed HBsAg: Negative (11/20 1158)   TDaP vaccine                       HIV:   Negative  Flu Shot                                GBS:   Contraception  Pap: 04/27/16 NIL  CBB     CS/TOLAC TOLAC if labor before scheduled c/s 7/11   [place] Person                 Preterm labor symptoms and general obstetric precautions including but not limited to vaginal bleeding, contractions, leaking of fluid and fetal movement were reviewed in detail with the patient. Please refer to After  Visit Summary for other counseling recommendations.   Return in about 2 weeks (around 06/01/2017) for [place].  Rod Can, CNM 05/18/2017 4:14 PM

## 2017-05-19 ENCOUNTER — Telehealth: Payer: Self-pay | Admitting: Obstetrics and Gynecology

## 2017-05-19 NOTE — Telephone Encounter (Signed)
Patient is aware of H&P at [facility] on 07/13/17 @ 10:10am w/ Dr. Gilman Schmidt, Pre-admit Testing afterwards, and OR on 07/14/17.

## 2017-05-23 ENCOUNTER — Other Ambulatory Visit: Payer: Self-pay

## 2017-05-23 ENCOUNTER — Observation Stay
Admission: EM | Admit: 2017-05-23 | Discharge: 2017-05-23 | Disposition: A | Discharge status: Home / Self Care | Payer: Commercial Managed Care - PPO | Attending: Obstetrics and Gynecology | Admitting: Obstetrics and Gynecology

## 2017-05-23 ENCOUNTER — Telehealth: Payer: Self-pay

## 2017-05-23 DIAGNOSIS — Z3480 Encounter for supervision of other normal pregnancy, unspecified trimester: Secondary | ICD-10-CM

## 2017-05-23 DIAGNOSIS — O9989 Other specified diseases and conditions complicating pregnancy, childbirth and the puerperium: Secondary | ICD-10-CM | POA: Insufficient documentation

## 2017-05-23 DIAGNOSIS — O26893 Other specified pregnancy related conditions, third trimester: Secondary | ICD-10-CM | POA: Diagnosis not present

## 2017-05-23 DIAGNOSIS — Z885 Allergy status to narcotic agent status: Secondary | ICD-10-CM | POA: Insufficient documentation

## 2017-05-23 DIAGNOSIS — O26853 Spotting complicating pregnancy, third trimester: Secondary | ICD-10-CM | POA: Diagnosis present

## 2017-05-23 DIAGNOSIS — R109 Unspecified abdominal pain: Secondary | ICD-10-CM | POA: Insufficient documentation

## 2017-05-23 DIAGNOSIS — O2243 Hemorrhoids in pregnancy, third trimester: Secondary | ICD-10-CM | POA: Insufficient documentation

## 2017-05-23 DIAGNOSIS — Z3A32 32 weeks gestation of pregnancy: Secondary | ICD-10-CM

## 2017-05-23 DIAGNOSIS — Z98891 History of uterine scar from previous surgery: Secondary | ICD-10-CM

## 2017-05-23 LAB — FETAL FIBRONECTIN: FETAL FIBRONECTIN: NEGATIVE

## 2017-05-23 LAB — URINALYSIS, COMPLETE (UACMP) WITH MICROSCOPIC
Bilirubin Urine: NEGATIVE
GLUCOSE, UA: 50 mg/dL — AB
Hgb urine dipstick: NEGATIVE
KETONES UR: NEGATIVE mg/dL
Nitrite: NEGATIVE
PROTEIN: NEGATIVE mg/dL
Specific Gravity, Urine: 1.013 (ref 1.005–1.030)
pH: 6 (ref 5.0–8.0)

## 2017-05-23 NOTE — OB Triage Note (Signed)
Pt G2P1001 [redacted]w[redacted]d [redacted]w[redacted]d complains of abdominal pain for the past hour. Pt states she has had bloody show for the past three hours. Pt states the pain is "really bad period cramps" that are more uncomfortable on the left side in the lower portion of her abdomen. Pt rates pain a 5/10. Pt denies any leaking of fluid but states a pink tinged thick, malodorous discharge. Monitors applied and assessing. VSS.

## 2017-05-23 NOTE — OB Triage Note (Signed)
Discharge instructions provided and reviewed.  Medications discussed.  Pt verbalized understanding.

## 2017-05-23 NOTE — Discharge Summary (Signed)
OB Triage Discharge Summary   27 y.o. G2P1001 @ [redacted]w[redacted]d presenting for abdominal pain and pink spotting.  Fetal status reassuring. EFM showed a reactive tracing with baseline 130s, moderate variability, accelerations present, decelerations absent. No contractions on tocometry.  Triage course: See H&P for additional details. Patient was up and walking after second vaginal exam, ate a regular dinner without problems and no increase in abdominal pain. Went to bathroom once more and did not have additional vaginal bleeding. Desires discharge from triage.  Labs: UA negative, Fetal fibronectin negative.  RTC: Scheduled ROB next Wednesday, call for earlier appointment or return to triage as needed.  Disposition: Home with return precauations for additional bleeding or worsening pain.  Avel Sensor, CNM 05/23/2017  10:07 PM

## 2017-05-23 NOTE — H&P (Signed)
Triage Visit Note  Stephanie Hayden is an 27 y.o. female.  HPI: Patient presents today to labor and delivery complaining of abdominal pain, left sided, tender to palpation. Has been present for 1 day. She denies fever, denies nausea or vomiting. She reports positive fetal movement. Denies leakage of fluid. Has had normal physiologic discharge, not issues with malodorous discharge or vaginal pain or itching. She has been having for a few weeks "lightening crotch."  She had constipation and took milk of magnesia on Thursday and had normal formed stool. Afterwards on Saturday and Sunday she had loose stool. She feels that this pain is worse than any associated constipation pain.  Past Medical History:  Diagnosis Date  . Endometriosis   . Family history of breast cancer    genetic testing letter sent 5/18  . Phlebitis following infusion    LEFT ARM-AFTER HAVING WISDOME TEETH TAKEN OUT IN APRIL 2017  . Urinary tract infection     Past Surgical History:  Procedure Laterality Date  . ABLATION ON ENDOMETRIOSIS  2017  . CESAREAN SECTION  2011  . CHROMOPERTUBATION N/A 06/06/2015   Procedure: CHROMOPERTUBATION;  Surgeon: Gae Dry, MD;  Location: [facility] ORS;  Service: Gynecology;  Laterality: N/A;  . LAPAROSCOPY N/A 06/06/2015   Procedure: LAPAROSCOPY OPERATIVE/ EXCISION OF ENDOMETRIOSIS;  Surgeon: Gae Dry, MD;  Location: [facility] ORS;  Service: Gynecology;  Laterality: N/A;  . TONSILLECTOMY    . WISDOM TOOTH EXTRACTION  04-2015    Family History  Problem Relation Age of Onset  . Hypertension Mother   . Diabetes Mother   . Migraines Mother   . Hypertension Father   . Diabetes Father   . Brain cancer Paternal Grandfather   . Brain cancer Cousin   . Breast cancer Paternal Aunt 61  . Breast cancer Paternal Aunt 83  . Breast cancer Paternal Aunt 37    Social History:  reports that she has never smoked. She has never used smokeless tobacco. She reports that she does not drink alcohol or  use drugs.  Allergies:  Allergies  Allergen Reactions  . Coconut Flavor Anaphylaxis  . Morphine And Related Hives and Swelling    Medications: I have reviewed the patient's current medications.  Results for orders placed or performed during the hospital encounter of 05/23/17 (from the past 48 hour(s))  Urinalysis, Complete w Microscopic     Status: Abnormal   Collection Time: 05/23/17  6:38 PM  Result Value Ref Range   Color, Urine YELLOW (A) YELLOW   APPearance HAZY (A) CLEAR   Specific Gravity, Urine 1.013 1.005 - 1.030   pH 6.0 5.0 - 8.0   Glucose, UA 50 (A) NEGATIVE mg/dL   Hgb urine dipstick NEGATIVE NEGATIVE   Bilirubin Urine NEGATIVE NEGATIVE   Ketones, ur NEGATIVE NEGATIVE mg/dL   Protein, ur NEGATIVE NEGATIVE mg/dL   Nitrite NEGATIVE NEGATIVE   Leukocytes, UA TRACE (A) NEGATIVE   RBC / HPF 0-5 0 - 5 RBC/hpf   WBC, UA 0-5 0 - 5 WBC/hpf   Bacteria, UA RARE (A) NONE SEEN   Squamous Epithelial / LPF 0-5 0 - 5   Mucus PRESENT     Comment: Performed at [facility], [address] [place], [place] [address]    No results found.  Review of Systems  Constitutional: Negative for chills, fever, malaise/fatigue and weight loss.  HENT: Negative for congestion, hearing loss and sinus pain.   Eyes: Negative for blurred vision and double vision.  Respiratory: Negative for cough, sputum production, shortness of breath and wheezing.   Cardiovascular: Negative for chest pain, palpitations, orthopnea and leg swelling.  Gastrointestinal: Positive for constipation and diarrhea. Negative for abdominal pain, nausea and vomiting.  Genitourinary: Negative for dysuria, flank pain, frequency, hematuria and urgency.       + vaginal bleeding, small  Musculoskeletal: Negative for back pain, falls and joint pain.  Skin: Negative for itching and rash.  Neurological: Negative for dizziness and headaches.  Psychiatric/Behavioral: Negative for depression, substance abuse and suicidal  ideas. The patient is not nervous/anxious.    Physical Exam  Constitutional: She is well-developed, well-nourished, and in no distress.  HENT:  Head: Normocephalic.  Eyes: Pupils are equal, round, and reactive to light.  Neck: Normal range of motion.  Cardiovascular: Normal rate and regular rhythm.  Pulmonary/Chest: Effort normal.  Abdominal: Soft. She exhibits no distension and no mass. There is tenderness. There is no rebound and no guarding.  Minimal bowel sounds  Genitourinary:  Genitourinary Comments: Closed  Visually, no active bleeding. No bleeding when swabbed with large q-tip . Shown to patient. SVE 0/0/-3 (external os 1 cm) FFN collected.   Vitals reviewed.  NST: reacitve, 130s. Moderate variability, no decelerations multiple 15x15 accelerations.  Toco: quiet   Blood pressure 114/79, pulse 82, temperature 97.6 F (36.4 C), temperature source Oral, resp. rate 16, height 5\' 6"  (1.676 m), weight 194 lb (88 kg), last menstrual period 10/11/2016. Physical Exam  Assessment/Plan:  27 yo with left sided abdominal pain and vaginal spotting, likely related to bowel irritation from constipation, possible stool impaction and now 2-3 days of liquid stool. Duanna has been having difficulty having bowel movements. She reports that she must take a zofran each morning, if she does not she becomes nauseous and has vomiting. She declines enema for constiptaiton.  Update: Patient was going to be discharged home but when she went to the bathroom she had vaginal bleeding that was a  Moderate amount of bright red blood that saturated several toilet paper squares and a quarter size spot on her underwear. Exam was repeated. She did not have active vaginal or rectal bleeding. Small hemorrhoids on rectum, on thrombosed but small less than a pencil eraser head, no active bleeding. Dossel placed in vagina and this did show a small amount of blood. Will monitor, will send previously collected FFN. Consider  monitoring overnight.   Stephanie Hayden 05/23/2017, 7:44 PM

## 2017-05-23 NOTE — Telephone Encounter (Signed)
Pt calling today having some pink spotting. No pain/cramping, still feeling baby move. Last intercourse was 2 days ago. I advised pt to head to hospital just to be checked out and make sure everything is okay since its so late in the day and didn't want her to wait until tomorrow. Pt will go to hospital then will call me back in the morning to let me know what they said/did at hospital.

## 2017-05-23 NOTE — Discharge Instructions (Signed)

## 2017-06-01 ENCOUNTER — Ambulatory Visit (INDEPENDENT_AMBULATORY_CARE_PROVIDER_SITE_OTHER): Payer: Commercial Managed Care - PPO | Admitting: Obstetrics and Gynecology

## 2017-06-01 VITALS — BP 106/72 | Wt 195.0 lb

## 2017-06-01 DIAGNOSIS — O26843 Uterine size-date discrepancy, third trimester: Secondary | ICD-10-CM

## 2017-06-01 DIAGNOSIS — R1110 Vomiting, unspecified: Secondary | ICD-10-CM

## 2017-06-01 DIAGNOSIS — Z98891 History of uterine scar from previous surgery: Secondary | ICD-10-CM

## 2017-06-01 DIAGNOSIS — Z3A33 33 weeks gestation of pregnancy: Secondary | ICD-10-CM

## 2017-06-01 DIAGNOSIS — Z3480 Encounter for supervision of other normal pregnancy, unspecified trimester: Secondary | ICD-10-CM

## 2017-06-01 MED ORDER — ONDANSETRON 4 MG PO TBDP: 4.0000 mg | ORAL_TABLET | Freq: Three times a day (TID) | ORAL | 1 refills | Status: DC | PRN

## 2017-06-01 NOTE — Progress Notes (Signed)
Routine Prenatal Care Visit  Subjective  Stephanie Hayden is a 27 y.o. G2P1001 at [redacted]w[redacted]d being seen today for ongoing prenatal care.  She is currently monitored for the following issues for this high-risk pregnancy and has Endometriosis; Abdominal pain in female; Headache syndrome; Menorrhagia with regular cycle; Weight gain, abnormal; Dysmenorrhea; Supervision of other normal pregnancy, antepartum; History of cesarean section; and [redacted] weeks gestation of pregnancy on their problem list.  ----------------------------------------------------------------------------------- Patient reports no complaints.    .  .   . Denies leaking of fluid.  ----------------------------------------------------------------------------------- The following portions of the patient's history were reviewed and updated as appropriate: allergies, current medications, past family history, past medical history, past social history, past surgical history and problem list. Problem list updated.   Objective  Blood pressure 106/72, weight 195 lb (88.5 kg), last menstrual period 10/11/2016. Pregravid weight 190 lb (86.2 kg) Total Weight Gain 5 lb (2.268 kg) Urinalysis:      Fetal Status:           General:  Alert, oriented and cooperative. Patient is in no acute distress.  Skin: Skin is warm and dry. No rash noted.   Cardiovascular: Normal heart rate noted  Respiratory: Normal respiratory effort, no problems with respiration noted  Abdomen: Soft, gravid, appropriate for gestational age.       Pelvic:  Cervical exam deferred        Extremities: Normal range of motion.     ental Status: Normal mood and affect. Normal behavior. Normal judgment and thought content.     Assessment   27 y.o. G2P1001 at [redacted]w[redacted]d by  07/17/2017, by Ultrasound presenting for routine prenatal visit  Plan   second Problems (from 11/23/16 to present)    Problem Noted Resolved   History of cesarean section 02/01/2017 by Will Bonnet, MD  No   Supervision of other normal pregnancy, antepartum 11/23/2016 by Rexene Agent, CNM No   Overview Addendum 05/05/2017  8:52 AM by Malachy Mood, MD      Clinic [facility] Prenatal Labs  Dating  EDC=T1 [place] Blood type: A/Positive/-- (11/20 1158)   Genetic Screen 1 Screen: negative  Antibody:Negative (11/20 1158)  Anatomic [place] Female, incomplete RVOT and LVOT [X]  complete at 24 weeks Rubella: <0.90 (11/20 1158)  Varicella: Immune  GTT 100   RPR: Non Reactive (11/20 1158)   Rhogam  Not needed HBsAg: Negative (11/20 1158)   TDaP vaccine                       HIV:   Negative  Flu Shot                                GBS:   Contraception  Pap: 04/27/16 NIL  CBB     CS/TOLAC TOLAC   Baby Food    Support Person                 Gestational age appropriate obstetric precautions including but not limited to vaginal bleeding, contractions, leaking of fluid and fetal movement were reviewed in detail with the patient.   - was seen for third trimester bleeding no KB normal monitoring - bleeding since stopped - obtain US growth and check placentation  Return in about 1 week (around 06/08/2017) for Growth scan S<D and ROB.  Malachy Mood, MD, [facility], [facility] 06/01/2017, 2:51 PM

## 2017-06-01 NOTE — Progress Notes (Signed)
ROB Refill on Zofran Vaginal swelling

## 2017-06-06 ENCOUNTER — Ambulatory Visit (INDEPENDENT_AMBULATORY_CARE_PROVIDER_SITE_OTHER): Payer: Commercial Managed Care - PPO

## 2017-06-06 ENCOUNTER — Telehealth: Payer: Self-pay

## 2017-06-06 ENCOUNTER — Ambulatory Visit (INDEPENDENT_AMBULATORY_CARE_PROVIDER_SITE_OTHER): Payer: Commercial Managed Care - PPO | Admitting: Obstetrics and Gynecology

## 2017-06-06 VITALS — BP 100/70 | Wt 200.0 lb

## 2017-06-06 DIAGNOSIS — Z3480 Encounter for supervision of other normal pregnancy, unspecified trimester: Secondary | ICD-10-CM

## 2017-06-06 DIAGNOSIS — O26843 Uterine size-date discrepancy, third trimester: Secondary | ICD-10-CM

## 2017-06-06 DIAGNOSIS — Z3483 Encounter for supervision of other normal pregnancy, third trimester: Secondary | ICD-10-CM | POA: Diagnosis not present

## 2017-06-06 DIAGNOSIS — Z3A34 34 weeks gestation of pregnancy: Secondary | ICD-10-CM

## 2017-06-06 DIAGNOSIS — Z98891 History of uterine scar from previous surgery: Secondary | ICD-10-CM

## 2017-06-06 NOTE — Progress Notes (Signed)
Routine Prenatal Care Visit  Subjective  Stephanie Hayden is a 27 y.o. G2P1001 at [redacted]w[redacted]d being seen today for ongoing prenatal care.  She is currently monitored for the following issues for this low-risk pregnancy and has Endometriosis; Abdominal pain in female; Headache syndrome; Menorrhagia with regular cycle; Weight gain, abnormal; Dysmenorrhea; Supervision of other normal pregnancy, antepartum; History of cesarean section; and [redacted] weeks gestation of pregnancy on their problem list.  ----------------------------------------------------------------------------------- Patient reports some increased pressure, irregular contractions, and lumbago.   Contractions: Not present. Vag. Bleeding: None.  Movement: Present. Denies leaking of fluid.  ----------------------------------------------------------------------------------- The following portions of the patient's history were reviewed and updated as appropriate: allergies, current medications, past family history, past medical history, past social history, past surgical history and problem list. Problem list updated.   Objective  Blood pressure 100/70, weight 200 lb (90.7 kg), last menstrual period 10/11/2016. Pregravid weight 190 lb (86.2 kg) Total Weight Gain 10 lb (4.536 kg) Urinalysis:      Fetal Status: Fetal Heart Rate (bpm): 130 Fundal Height: 32 cm Movement: Present     General:  Alert, oriented and cooperative. Patient is in no acute distress.  Skin: Skin is warm and dry. No rash noted.   Cardiovascular: Normal heart rate noted  Respiratory: Normal respiratory effort, no problems with respiration noted  Abdomen: Soft, gravid, appropriate for gestational age. Pain/Pressure: Present     Pelvic:  Cervical exam deferred        Extremities: Normal range of motion.     ental Status: Normal mood and affect. Normal behavior. Normal judgment and thought content.   US Ob Follow Up  Result Date: 06/06/2017 ULTRASOUND REPORT Patient Name:  Stephanie Hayden DOB: [birth year] MRN: [id number] Location: [facility] Date of Service: 06/06/2017 Indications:fundal height does not correlate to gestational age Findings: [name] intrauterine pregnancy is visualized with FHR at 52 BPM. Biometrics give an (U/S) Gestational age of [redacted]w[redacted]d and an (U/S) EDD of 07/15/2017; this correlates with the clinically established Estimated Date of Delivery: 07/17/17. Fetal presentation is Cephalic. Placenta: Anterior, grade 1. AFI: 11.02 cm Growth percentile is 51st. EFW: 5lbs 6oz Impression: 1. [redacted]w[redacted]d Viable Singleton Intrauterine pregnancy previously established criteria. 2. Growth is 51st %ile.  AFI is 11.02 cm. Recommendations: 1.Clinical correlation with the patient's History and Physical Exam. Edwena Bunde, RDMS, RVT There is a singleton gestation with normal amniotic fluid volume. The fetal biometry correlates with established dating.  Limited fetal anatomy was performed.The visualized fetal anatomical survey appears within normal limits within the resolution of ultrasound as described above.  It must be noted that a normal ultrasound is unable to rule out fetal aneuploidy.  Malachy Mood, MD, [facility], [facility] 06/06/2017, 1:31 PM    Assessment   27 y.o. G2P1001 at [redacted]w[redacted]d by  07/17/2017, by Ultrasound presenting for routine prenatal visit  Plan   second Problems (from 11/23/16 to present)    Problem Noted Resolved   History of cesarean section 02/01/2017 by Will Bonnet, MD No   Supervision of other normal pregnancy, antepartum 11/23/2016 by Rexene Agent, CNM No   Overview Addendum 05/05/2017  8:52 AM by Malachy Mood, MD      [facility] Prenatal Labs  Dating  EDC=T1 [place] Blood type: A/Positive/-- (11/20 1158)   Genetic Screen 1 Screen: negative  Antibody:Negative (11/20 1158)  Anatomic [place] Female, incomplete RVOT and LVOT [X]  complete at 24 weeks Rubella: <0.90 (11/20 1158)  Varicella: Immune  GTT  100    RPR: Non Reactive (11/20 1158)   Rhogam  Not needed HBsAg: Negative (11/20 1158)   TDaP vaccine                       HIV:   Negative  Flu Shot                                GBS:   Contraception  Pap: 04/27/16 NIL  CBB     CS/TOLAC TOLAC   Baby Food    Support Person                 Gestational age appropriate obstetric precautions including but not limited to vaginal bleeding, contractions, leaking of fluid and fetal movement were reviewed in detail with the patient.   - appropriate growth on ultrasound today  Return in about 2 weeks (around 06/20/2017) for ROB.  Malachy Mood, MD, [facility], [facility] 06/06/2017, 1:43 PM

## 2017-06-06 NOTE — Telephone Encounter (Signed)
Pt just left office from seeing AMS. She has spoken with her director who wants her to go ahead & come out of work having this Thursday be her last day at work. AMS stated he could write a note to get her thru until she can get her FMLA paperwork to [place] to complete. [phone]

## 2017-06-07 NOTE — Telephone Encounter (Signed)
Patient is calling to follow up on Note for work. Please advise

## 2017-06-07 NOTE — Telephone Encounter (Signed)
Pt following up on Dr Note. Pt reports she lost her mucus plug today. 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 Pt inquiring if it is safe for her to work. [phone]

## 2017-06-08 ENCOUNTER — Encounter: Payer: Self-pay | Admitting: Obstetrics and Gynecology

## 2017-06-08 NOTE — Telephone Encounter (Signed)
Should be in her chart ready to print and have her pick up

## 2017-06-08 NOTE — Telephone Encounter (Signed)
Notified pt letter ready for p/u. Pt lives 45 minutes away. She will call back with work fax #

## 2017-06-20 ENCOUNTER — Ambulatory Visit (INDEPENDENT_AMBULATORY_CARE_PROVIDER_SITE_OTHER): Payer: Commercial Managed Care - PPO | Admitting: Obstetrics and Gynecology

## 2017-06-20 VITALS — BP 104/68 | Wt 202.0 lb

## 2017-06-20 DIAGNOSIS — Z3685 Encounter for antenatal screening for Streptococcus B: Secondary | ICD-10-CM

## 2017-06-20 DIAGNOSIS — Z98891 History of uterine scar from previous surgery: Secondary | ICD-10-CM

## 2017-06-20 DIAGNOSIS — Z3480 Encounter for supervision of other normal pregnancy, unspecified trimester: Secondary | ICD-10-CM

## 2017-06-20 DIAGNOSIS — Z3483 Encounter for supervision of other normal pregnancy, third trimester: Secondary | ICD-10-CM

## 2017-06-20 DIAGNOSIS — R55 Syncope and collapse: Secondary | ICD-10-CM

## 2017-06-20 DIAGNOSIS — Z3A36 36 weeks gestation of pregnancy: Secondary | ICD-10-CM

## 2017-06-20 NOTE — Progress Notes (Signed)
ROB Syncope episodes several a day Hemorrhoids GBS

## 2017-06-20 NOTE — Progress Notes (Signed)
Routine Prenatal Care Visit  Subjective  Stephanie Hayden is a 27 y.o. G2P1001 at [redacted]w[redacted]d being seen today for ongoing prenatal care.  She is currently monitored for the following issues for this low-risk pregnancy and has Endometriosis; Headache syndrome; Dysmenorrhea; Supervision of other normal pregnancy, antepartum; and History of cesarean section on their problem list.  ----------------------------------------------------------------------------------- Patient reports syncopal episodes.  States no clear precipitating factors.  Normal H&H at time of 28 week labs.  States these episodes have been occurring more frequently.  Diaphoresis, tunnel vision, palpitations, ringing in the ears.  Symptoms to improve on laying down.  No chest pain.   Contractions: Irregular. Vag. Bleeding: None.  Movement: Present. Denies leaking of fluid.  ----------------------------------------------------------------------------------- The following portions of the patient's history were reviewed and updated as appropriate: allergies, current medications, past family history, past medical history, past social history, past surgical history and problem list. Problem list updated.   Objective  Blood pressure 104/68, weight 202 lb (91.6 kg), last menstrual period 10/11/2016. Pregravid weight 190 lb (86.2 kg) Total Weight Gain 12 lb (5.443 kg) Urinalysis: Urine Protein: Negative Urine Glucose: Negative  Fetal Status: Fetal Heart Rate (bpm): 140 Fundal Height: 35 cm Movement: Present     General:  Alert, oriented and cooperative. Patient is in no acute distress.  Skin: Skin is warm and dry. No rash noted.   Cardiovascular: Normal heart rate noted  Respiratory: Normal respiratory effort, no problems with respiration noted  Abdomen: Soft, gravid, appropriate for gestational age. Pain/Pressure: Present     Pelvic:  Cervical exam deferred        Extremities: Normal range of motion.     ental Status: Normal mood and  affect. Normal behavior. Normal judgment and thought content.     Assessment   27 y.o. G2P1001 at [redacted]w[redacted]d by  07/17/2017, by Ultrasound presenting for routine prenatal visit  Plan   second Problems (from 11/23/16 to present)    Problem Noted Resolved   History of cesarean section 02/01/2017 by Will Bonnet, MD No   Supervision of other normal pregnancy, antepartum 11/23/2016 by Rexene Agent, CNM No   Overview Addendum 05/05/2017  8:52 AM by Malachy Mood, MD      [facility] Prenatal Labs  Dating  EDC=T1 [place] Blood type: A/Positive/-- (11/20 1158)   Genetic Screen 1 Screen: negative  Antibody:Negative (11/20 1158)  Anatomic [place] Female, incomplete RVOT and LVOT [X]  complete at 24 weeks Rubella: <0.90 (11/20 1158)  Varicella: Immune  GTT 100   RPR: Non Reactive (11/20 1158)   Rhogam  Not needed HBsAg: Negative (11/20 1158)   TDaP vaccine                       HIV:   Negative  Flu Shot                                GBS:   Contraception  Pap: 04/27/16 NIL  CBB     CS/TOLAC TOLAC   Baby Food    Support Person                 Gestational age appropriate obstetric precautions including but not limited to vaginal bleeding, contractions, leaking of fluid and fetal movement were reviewed in detail with the patient.   - Recheck CBC as well as TSH - Cardiac etiology seems unlikely in a young patient with  out cardiac history.  No evidence of edema, normotensive but will have cardiology evalute - Discussed that most likely etiology is caval compression from and enlarging uterus, discussed use of compression hose, hydration, and avoiding overheating.  Return in about 1 week (around 06/27/2017) for ROB.  Malachy Mood, MD, [facility], [facility] 06/20/2017, 5:48 PM

## 2017-06-21 LAB — TSH: TSH: 1.31 u[IU]/mL (ref 0.450–4.500)

## 2017-06-21 LAB — CBC
HEMOGLOBIN: 11.9 g/dL (ref 11.1–15.9)
Hematocrit: 36.9 % (ref 34.0–46.6)
MCH: 26.9 pg (ref 26.6–33.0)
MCHC: 32.2 g/dL (ref 31.5–35.7)
MCV: 84 fL (ref 79–97)
PLATELETS: 235 10*3/uL (ref 150–450)
RBC: 4.42 x10E6/uL (ref 3.77–5.28)
RDW: 14.3 % (ref 12.3–15.4)
WBC: 10.7 10*3/uL (ref 3.4–10.8)

## 2017-06-22 ENCOUNTER — Ambulatory Visit (INDEPENDENT_AMBULATORY_CARE_PROVIDER_SITE_OTHER): Payer: Commercial Managed Care - PPO | Admitting: Internal Medicine

## 2017-06-22 ENCOUNTER — Encounter: Payer: Self-pay | Admitting: Internal Medicine

## 2017-06-22 ENCOUNTER — Telehealth: Payer: Self-pay

## 2017-06-22 ENCOUNTER — Other Ambulatory Visit: Payer: Self-pay | Admitting: Obstetrics and Gynecology

## 2017-06-22 VITALS — BP 112/60 | HR 80 | Ht 67.0 in | Wt 193.0 lb

## 2017-06-22 DIAGNOSIS — R55 Syncope and collapse: Secondary | ICD-10-CM | POA: Diagnosis not present

## 2017-06-22 DIAGNOSIS — Z3A36 36 weeks gestation of pregnancy: Secondary | ICD-10-CM

## 2017-06-22 DIAGNOSIS — Z3480 Encounter for supervision of other normal pregnancy, unspecified trimester: Secondary | ICD-10-CM

## 2017-06-22 DIAGNOSIS — Z98891 History of uterine scar from previous surgery: Secondary | ICD-10-CM

## 2017-06-22 LAB — STREP GP B NAA: STREP GROUP B AG: NEGATIVE

## 2017-06-22 NOTE — Patient Instructions (Addendum)
Medication Instructions:  Your physician recommends that you continue on your current medications as directed. Please refer to the Current Medication list given to you today.   Labwork: none  Testing/Procedures: Your physician has requested that you have an echocardiogram. Echocardiography is a painless test that uses sound waves to create images of your heart. It provides your doctor with information about the size and shape of your heart and how well your heart's chambers and valves are working. This procedure takes approximately one hour. There are no restrictions for this procedure. You may get an IV, if needed, to receive an ultrasound enhancing agent through to better visualize your heart.    Your physician has recommended that you wear an [place] event monitor. Event monitors are medical devices that record the heart's electrical activity. Doctors most often [place] these monitors to diagnose arrhythmias. Arrhythmias are problems with the speed or rhythm of the heartbeat. The monitor is a small, portable device. You can wear one while you do your normal daily activities. This is usually used to diagnose what is causing palpitations/syncope (passing out). - You will be mailed a monitor from [employer].  - They will call you in the next day or so to verify your address. Then is will take 5-7 days to be mailed to you. - You will wear for 14 days and then place all the pieces of equipment that came with the device back in the provided box and take it to your nearest UPS drop off locations. - Call [employer] at [phone], if you have any questions concerning the monitor once you have received it. - DO NOT GET THE MONITOR WET.    Follow-Up: Your physician recommends that you schedule a follow-up appointment in: [place].    Echocardiogram An echocardiogram, or echocardiography, uses sound waves (ultrasound) to produce an image of your heart. The echocardiogram is  simple, painless, obtained within a short period of time, and offers valuable information to your health care provider. The images from an echocardiogram can provide information such as:  Evidence of coronary artery disease (CAD).  Heart size.  Heart muscle function.  Heart valve function.  Aneurysm detection.  Evidence of a past heart attack.  Fluid buildup around the heart.  Heart muscle thickening.  Assess heart valve function.  Tell a health care provider about:  Any allergies you have.  All medicines you are taking, including vitamins, herbs, eye drops, creams, and over-the-counter medicines.  Any problems you or family members have had with anesthetic medicines.  Any blood disorders you have.  Any surgeries you have had.  Any medical conditions you have.  Whether you are pregnant or may be pregnant. What happens before the procedure? No special preparation is needed. Eat and drink normally. What happens during the procedure?  In order to produce an image of your heart, gel will be applied to your chest and a wand-like tool (transducer) will be moved over your chest. The gel will help transmit the sound waves from the transducer. The sound waves will harmlessly bounce off your heart to allow the heart images to be captured in real-time motion. These images will then be recorded.  You may need an IV to receive a medicine that improves the quality of the pictures. What happens after the procedure? You may return to your normal schedule including diet, activities, and medicines, unless your health care provider tells you otherwise. This information is not intended to replace advice given to  you by your health care provider. Make sure you discuss any questions you have with your health care provider. Document Released: 12/19/1999 Document Revised: 08/09/2015 Document Reviewed: 08/28/2012 Elsevier Interactive Patient Education  2017 [employer].

## 2017-06-22 NOTE — Telephone Encounter (Signed)
Pt saw Dr. Saunders Revel Cardiologist today. He asked for pt to contact us to see if her CS date could be moved up. He feels as soon as she has the baby it will relieve all these issues. The sooner the better. Dr. Saunders Revel should be sending his notes from today. [phone]

## 2017-06-22 NOTE — Progress Notes (Signed)
New Outpatient Visit Date: 06/22/2017  Referring Provider: Malachy Mood, MD [address], [place] [address]  Chief Complaint: Syncope  HPI:  [name] is a 27 y.o. female who is being seen today for the evaluation of syncope at the request of Dr. Georgianne Fick. She is currently pregnant ([redacted]w[redacted]d) and has a history of endometriosis.  Since reaching the 28-week mark of her pregnancy, [name] has had approximately 20-25 syncopal or near syncopal episodes.  She describes developing tunnel vision and buzzing in her ears and then blacks out for a few seconds.  There are no clear precipitants, with the events happening when she is standing or sitting.  Sometimes, she is able to lie down and abort the episodes.  She notes that her heart rate is often elevated during the episodes, up to 140 to 150 bpm on her watch.  She denies chest pain, orthopnea, PND, and edema.  She has noted mild exertional dyspnea as her pregnancy has progressed.  She has been trying to stay well-hydrated and wear compression stockings.  She was told by her obstetrician, Dr. 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  She denies having similar symptoms during her first pregnancy.  She has noted intermittent nausea, including earlier today.  This is not always associated with her syncopal episodes.  [name] denies a history of prior cardiac disease.  Before this pregnancy, she had never passed out.  She has never had previous cardiac testing.  However, she is concerned because her mother was diagnosed with cardiomyopathy during her second pregnancy and had cardiac arrest during delivery.  Fortunately, she was resuscitated and treated for her cardiomyopathy.   --------------------------------------------------------------------------------------------------  Cardiovascular History & Procedures: Cardiovascular Problems:  Syncope  Risk  Factors:  None  Cath/PCI:  None  CV Surgery:  None  EP Procedures and Devices:  None  Non-Invasive Evaluation(s):  Left lower extremity venous duplex (04/06/2017): No evidence of DVT.  Recent CV Pertinent Labs: Lab Results  Component Value Date   K 4.0 11/27/2016   K 3.7 09/01/2011   BUN 11 11/27/2016   BUN 9 01/19/2016   BUN 8 09/01/2011   CREATININE 0.67 11/27/2016   CREATININE 0.33 (L) 09/01/2011    --------------------------------------------------------------------------------------------------  Past Medical History:  Diagnosis Date  . Endometriosis   . Family history of breast cancer    genetic testing letter sent 5/18  . Phlebitis following infusion    LEFT ARM-AFTER HAVING WISDOME TEETH TAKEN OUT IN APRIL 2017  . Urinary tract infection     Past Surgical History:  Procedure Laterality Date  . ABLATION ON ENDOMETRIOSIS  2017  . CESAREAN SECTION  2011  . CHROMOPERTUBATION N/A 06/06/2015   Procedure: CHROMOPERTUBATION;  Surgeon: Gae Dry, MD;  Location: [facility] ORS;  Service: Gynecology;  Laterality: N/A;  . LAPAROSCOPY N/A 06/06/2015   Procedure: LAPAROSCOPY OPERATIVE/ EXCISION OF ENDOMETRIOSIS;  Surgeon: Gae Dry, MD;  Location: [facility] ORS;  Service: Gynecology;  Laterality: N/A;  . TONSILLECTOMY    . WISDOM TOOTH EXTRACTION  04-2015    Current Meds  Medication Sig  . diphenhydramine-acetaminophen (TYLENOL PM) 25-500 MG TABS tablet Take 1 tablet by mouth at bedtime as needed.  . ondansetron (ZOFRAN-ODT) 4 MG disintegrating tablet Take 1 tablet (4 mg total) by mouth every 8 (eight) hours as needed for nausea or vomiting.  . Prenatal Vit-Fe Fumarate-FA (PRENATAL MULTIVITAMIN) TABS tablet Take 1 tablet by mouth daily at 12 noon.  Allergies: Coconut flavor and Morphine and related  Social History   Tobacco Use  . Smoking status: Never Smoker  . Smokeless tobacco: Never Used  Substance Use Topics  . Alcohol use: No  . Drug use: No     Family History  Problem Relation Age of Onset  . Hypertension Mother   . Diabetes Mother   . Migraines Mother   . Heart failure Mother        Coded during delivery of second child  . Hypertension Father   . Diabetes Father   . Heart attack Father 97  . Brain cancer Paternal Grandfather   . Brain cancer Cousin   . Breast cancer Paternal Aunt 69  . Breast cancer Paternal Aunt 26  . Breast cancer Paternal Aunt 77    Review of Systems: A 12-system review of systems was performed and was negative except as noted in the HPI.  --------------------------------------------------------------------------------------------------  Physical Exam: BP 112/60 (BP Location: Left Arm, Patient Position: Sitting, Cuff Size: Normal)   Pulse 80   Ht 5\' 7"  (1.702 m)   Wt 193 lb (87.5 kg)   LMP 10/11/2016 (Exact Date)   BMI 30.23 kg/m   General: Well-developed, well-nourished pregnant woman, seated comfortably in the exam room.  She appears mildly anxious. HEENT: No conjunctival pallor or scleral icterus. Moist mucous membranes. OP clear. Neck: Supple without lymphadenopathy, thyromegaly, JVD, or HJR. No carotid bruit. Lungs: Normal work of breathing. Clear to auscultation bilaterally without wheezes or crackles. Heart: Regular rate and rhythm without murmurs, rubs, or gallops. Non-displaced PMI. Abd: Bowel sounds present. Soft, NT/ND without hepatosplenomegaly.  Gravid uterus noted. Ext: No lower extremity edema. Radial, PT, and DP pulses are 2+ bilaterally Skin: Warm and dry without rash. Neuro: CNIII-XII intact. Strength and fine-touch sensation intact in upper and lower extremities bilaterally. Psych: Normal mood and affect.  Omitted bedside echo performed in clinic: Normal LV size and function.  LVEF approximately 60%.  Trileaflet aortic valve without stenosis.  Grossly normal mitral valve.  RV is grossly normal in size and systolic function.  No significant pericardial effusion.  EKG:  Normal sinus rhythm with sinus arrhythmia.  No significant abnormalities.  Lab Results  Component Value Date   WBC 10.7 06/20/2017   HGB 11.9 06/20/2017   HCT 36.9 06/20/2017   MCV 84 06/20/2017   PLT 235 06/20/2017    Lab Results  Component Value Date   NA 136 11/27/2016   K 4.0 11/27/2016   CL 104 11/27/2016   CO2 17 (L) 11/27/2016   BUN 11 11/27/2016   CREATININE 0.67 11/27/2016   GLUCOSE 84 11/27/2016   ALT 19 11/27/2016    No results found for: CHOL, HDL, LDLCALC, LDLDIRECT, TRIG, CHOLHDL   --------------------------------------------------------------------------------------------------  ASSESSMENT AND PLAN: Syncope The patient reports at least 20-25 syncopal episodes in the last 8 weeks.  Exam today is unrevealing.  She did not demonstrate orthostatic hypotension, though she reports feeling dizzy when standing for just a brief period of time.  She appears euvolemic on exam.  Limited bedside echo today does not show any significant structural abnormalities to explain her symptoms.  EKG is also normal.  I have spoken with Dr. Georgianne Fick regarding these findings.  I do not have a clear explanation for her syncope.  I encouraged Ms. Pruitt to stay well-hydrated and to continue wearing compression stockings when possible.  We will obtain a formal echocardiogram as soon as possible as well as arranged for a 14-day event monitor.  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  I encouraged Ms. Pruitt to discuss this with Dr. Georgianne Fick.  Given unexplained syncope, I have advised Ms. Pruitt to refrain from driving.  Follow-up: Return to clinic in 1 month.  Nelva Bush, MD 06/22/2017 8:57 PM

## 2017-06-23 ENCOUNTER — Ambulatory Visit (INDEPENDENT_AMBULATORY_CARE_PROVIDER_SITE_OTHER): Payer: Commercial Managed Care - PPO

## 2017-06-23 ENCOUNTER — Other Ambulatory Visit: Payer: Self-pay

## 2017-06-23 DIAGNOSIS — R55 Syncope and collapse: Secondary | ICD-10-CM | POA: Diagnosis not present

## 2017-06-27 ENCOUNTER — Ambulatory Visit
Admission: RE | Admit: 2017-06-27 | Discharge: 2017-06-27 | Disposition: A | Discharge status: Home / Self Care | Payer: Commercial Managed Care - PPO | Source: Ambulatory Visit | Attending: Obstetrics & Gynecology | Admitting: Obstetrics & Gynecology

## 2017-06-27 DIAGNOSIS — Z3480 Encounter for supervision of other normal pregnancy, unspecified trimester: Secondary | ICD-10-CM | POA: Diagnosis not present

## 2017-06-27 DIAGNOSIS — Z98891 History of uterine scar from previous surgery: Secondary | ICD-10-CM | POA: Diagnosis not present

## 2017-06-27 NOTE — Progress Notes (Signed)
[facility] Consultation   Chief Complaint: syncope  HPI: [name] is a 27 y.o. G2P1001 at [redacted]w[redacted]d who presents in consultation from  for discussion of delivery timing due to worsening syncopal episodes.  [name] reports she began having pre-syncopal episodes at ~ 28 weeks.  Currently she has multiple episodes per day of pre-syncope and true syncope at least a few times per weeks.  She is able to feel the episodes coming on.  She describes that they start with, "tunnel vision" and a ringing in her ears.  They are sometimes, but not always accompanied by palpitations.  Sitting down or laying down typically improves the episodes.  The entire episode typically last ~ 5 minutes.    She reports multiple episodes during the day.  She said recently she had an episode in the shower and she had to lay on the floor of the shower until the episode passed.  She also reports she sometimes will have episodes while sitting in a recliner relaxing at home.    [name] denies any personal history of cardiac disease.  She also have never had these episodes outside of pregnancy nor in her prior pregnancy.  She reports very minimal caffeine intake (less than 1 cup per day), does not smoke nor use drugs or any other medications     Past Medical History: Patient  has a past medical history of Endometriosis, Family history of breast cancer, Phlebitis following infusion, and Urinary tract infection.  Past Surgical History: She  has a past surgical history that includes Cesarean section (2011); Tonsillectomy; Wisdom tooth extraction (04-2015); laparoscopy (N/A, 06/06/2015); Chromopertubation (N/A, 06/06/2015); and Ablation on endometriosis (2017).   Obstetric History:  OB History    Gravida  2   Para  1   Term  1   Preterm      AB      Living  1     SAB      TAB      Ectopic      Multiple      Live Births  1         Delivery at term via c-section due to NRFHT during  induction  Gynecologic History:  Patient's last menstrual period was 10/11/2016 (exact date).   Medications: Zofran, tylenol pm for restless leg syndrome  Allergies: Patient is allergic to coconut flavor and morphine and related.  Social History: Patient  reports that she has never smoked. She has never used smokeless tobacco. She reports that she does not drink alcohol or use drugs.  Family History: family history includes Brain cancer in her cousin and paternal grandfather; Breast cancer (age of onset: 43) in her paternal aunt, paternal aunt, and paternal aunt; Diabetes in her father and mother; Heart attack (age of onset: 52) in her father; Heart failure in her mother; Hypertension in her father and mother; Migraines in her mother.   Stephanie Hayden's mother was diagnosed with cardiomyopathy and had cardiac arrest in her second pregnancy.  She was successfully resuscitated, alive and doing well.  She gets intermittent swelling and is on lasix   Review of Systems A full 12 point review of systems was negative or as noted in the History of Present Illness.  Physical Exam: BP 108/66   Pulse 81   Temp 97.7 F (36.5 C)   Resp 16   Ht 5\' 7"  (1.702 m)   Wt 199 lb (90.3 kg)   LMP 10/11/2016 (Exact Date)  SpO2 95%   BMI 31.17 kg/m   Asessement: 1. History of cesarean section   2. Supervision of other normal pregnancy, antepartum   3.  Worsening syncope due to unclear etiology awaiting Holter monitor  Plan: 1. Syncope:  [name] has has a through workup including normal Hct, TFTs, EKG and echocardiogram.  Holter pending.  Consider check electrolytes with next lab draw as that is the only laboratory portion of the work up I don't see.      Per cardiology evaluation there is no evidence of hypovolemia to explain the symptoms.  She was seen by cardiology on 06/19/17 and I spoke to Dr. Saunders Revel personally.  Dr. Saunders Revel ordered a 14-day holter monitor.  Upon talking to Stephanie Hayden, I learned she has not yet  started the Holter because she did not receive the chest strap.  She believes she will receive the strap today.  Based on her symptoms and the volume expansion/atrial stretch associated with pregnancy, I am concerned about a possible arrhythmia that may explain her symptoms.  I think the information from the Holter will be most helpful to uncover any arrhythmia.  I believe her symptoms are likely pregnancy related, however given the etiology is unclear it is unclear if the symptoms will completely resolve with delivery.    In terms of delivery timing we discussed that early term delivery is reasonable given the increasing severity of her symptoms and likely relationship to pregnancy.  We discussed the neonatal risks of early term delivery and potential benefits of full term (39 week) delivery.  Given the frequency of syncope, I am concerned about her risk of falling and subsequent maternal or fetal injury.  She was advised not to drive and I reiterated that advice.    I suggest correlation of her symptoms with the Holter monitor information and discussion with OB anesthesia prior to planned delivery. Based on Holter monitor findings and discussion with cardiology/anesthesiology telemetry monitoring may be indicated during delivery and for ~ 24-48 hours post partum.  [name] is scheduled for repeat CD.  Although repeat CD is perfectly reasonable if desired by the patient, I see no absolute contraidication for trial of labor.  If trial of labor is desired I suggest an early epidural and low threshold for operative delivery to shorten the length of the second stage.    2. FWB:  The last growth ultrasound was done at [facility] on 06/06/17 at 34 3/7.  EFW was at the 51st percentile.  Normal fluid.  Cephalic.  GBS neg.  3. Follow up:  [name] will follow up with her primary OB.  Hopefully over the next few days she will have Holter monitor information.  I also spoke to Dr. Glennon Mac from the [facility] OB[facility]GYN  group and he will speak to the [facility] anesthesia group.    Currently the CD is scheduled for 07/14/17.  As long as the primary group, anesthesia and cardiology are comfortable that Stephanie Hayden can safely delivery at [facility], I am comfortable with moving up the delivery date given the worsening symptoms above. If there are concerns about safe delivery at [facility], we are happy to facilitate transfer of care with delivery at [facility].    Total time spent with the patient was 40 minutes with greater than 50% spent in counseling and coordination of care.  We appreciate this interesting consult and will be happy to be involved in the ongoing care of Stephanie Hayden in anyway her obstetricians desire.  An  MFM will be on staff our [place] office on Thursday.  I am also happy to provide further comment/answer any questions as needed.  Louie Bun, MD Maternal-Fetal Medicine [facility] Pager: [phone]

## 2017-06-28 ENCOUNTER — Encounter (INDEPENDENT_AMBULATORY_CARE_PROVIDER_SITE_OTHER): Payer: Self-pay

## 2017-06-28 ENCOUNTER — Ambulatory Visit (INDEPENDENT_AMBULATORY_CARE_PROVIDER_SITE_OTHER): Payer: Commercial Managed Care - PPO | Admitting: Obstetrics and Gynecology

## 2017-06-28 ENCOUNTER — Other Ambulatory Visit: Payer: Self-pay | Admitting: Obstetrics and Gynecology

## 2017-06-28 VITALS — BP 110/66 | Wt 204.0 lb

## 2017-06-28 DIAGNOSIS — Z3480 Encounter for supervision of other normal pregnancy, unspecified trimester: Secondary | ICD-10-CM

## 2017-06-28 DIAGNOSIS — O9989 Other specified diseases and conditions complicating pregnancy, childbirth and the puerperium: Secondary | ICD-10-CM

## 2017-06-28 DIAGNOSIS — Z98891 History of uterine scar from previous surgery: Secondary | ICD-10-CM

## 2017-06-28 DIAGNOSIS — R55 Syncope and collapse: Secondary | ICD-10-CM

## 2017-06-28 DIAGNOSIS — Z3A37 37 weeks gestation of pregnancy: Secondary | ICD-10-CM

## 2017-06-28 DIAGNOSIS — O34219 Maternal care for unspecified type scar from previous cesarean delivery: Secondary | ICD-10-CM

## 2017-06-28 NOTE — Progress Notes (Signed)
Rob

## 2017-06-28 NOTE — Progress Notes (Signed)
Routine Prenatal Care Visit  Subjective  Stephanie Hayden is a 27 y.o. G2P1001 at [redacted]w[redacted]d being seen today for ongoing prenatal care.  She is currently monitored for the following issues for this high-risk pregnancy and has Endometriosis; Headache syndrome; Dysmenorrhea; Supervision of other normal pregnancy, antepartum; History of cesarean section; and Syncope on their problem list.  ----------------------------------------------------------------------------------- Patient reports continued syncopal episodes.   Contractions: Irregular. Vag. Bleeding: None.  Movement: Present. Denies leaking of fluid.  ----------------------------------------------------------------------------------- The following portions of the patient's history were reviewed and updated as appropriate: allergies, current medications, past family history, past medical history, past social history, past surgical history and problem list. Problem list updated.   Objective  Blood pressure 110/66, weight 204 lb (92.5 kg), last menstrual period 10/11/2016. Pregravid weight 190 lb (86.2 kg) Total Weight Gain 14 lb (6.35 kg) Urinalysis:      Fetal Status: Fetal Heart Rate (bpm): 135 Fundal Height: 36 cm Movement: Present  Presentation: Vertex  General:  Alert, oriented and cooperative. Patient is in no acute distress.  Skin: Skin is warm and dry. No rash noted.   Cardiovascular: Normal heart rate noted  Respiratory: Normal respiratory effort, no problems with respiration noted  Abdomen: Soft, gravid, appropriate for gestational age. Pain/Pressure: Present     Pelvic:  Cervical exam deferred Dilation: 1 Effacement (%): 50 Station: -3  Extremities: Normal range of motion.     ental Status: Normal mood and affect. Normal behavior. Normal judgment and thought content.   Immunization History  Administered Date(s) Administered  . Tdap 05/18/2017     Assessment   26 y.o. G2P1001 at [redacted]w[redacted]d by  07/17/2017, by Ultrasound  presenting for routine prenatal visit  Plan   second Problems (from 11/23/16 to present)    Problem Noted Resolved   History of cesarean section 02/01/2017 by Will Bonnet, MD No   Supervision of other normal pregnancy, antepartum 11/23/2016 by Rexene Agent, CNM No   Overview Addendum 06/22/2017  8:11 AM by Malachy Mood, MD      Clinic [facility] Prenatal Labs  Dating  EDC=T1 [place] Blood type: A/Positive/-- (11/20 1158)   Genetic Screen 1 Screen: negative  Antibody:Negative (11/20 1158)  Anatomic [place] Female, incomplete RVOT and LVOT [X]  complete at 24 weeks Rubella: <0.90 (11/20 1158)  Varicella: Immune  GTT 100   RPR: Non Reactive (11/20 1158)   Rhogam  Not needed HBsAg: Negative (11/20 1158)   TDaP vaccine 05/18/2017                 HIV:   Negative  Flu Shot                                [id number]  Contraception Tubal ligation Pap: 04/27/16 NIL  CBB     CS/TOLAC Repeat C-section   Baby Food    Support Person                 Gestational age appropriate obstetric precautions including but not limited to vaginal bleeding, contractions, leaking of fluid and fetal movement were reviewed in detail with the patient.    1) Syncopal episodes - no cardiac etiology identified.  Negative bedside echo with normal ejection fraction.  Holter monitor results pending.  [facility] suggest possible early term delivery given recurrent nature and increasing frequency of episodes.  Driving restriction in place.  Patient is leaning towards repeat Cesarean section. -  Tentatively moved up to July 05, 2017 from July 14, 2017 ([redacted]w[redacted]d) - Anesthesia consult - Recommend 24-48hrs of telemetry postpartum  Return in about 1 week (around 07/05/2017) for ROB.  Malachy Mood, MD, [facility], [facility] 06/28/2017, 1:06 PM

## 2017-06-29 NOTE — Anesthesia Pain Management Evaluation Note (Signed)
SPOKE WITH DR P CARROLL AND REVIEWED CARDIAC NOTE FROM DR END 06/22/17. DR Kayleen Memos OK'ED [place] PATIENT DELIVER HERE 07/05/17. NOTIFIED NANCY AT DR [facility]

## 2017-06-30 ENCOUNTER — Telehealth: Payer: Self-pay | Admitting: *Deleted

## 2017-06-30 NOTE — Telephone Encounter (Signed)
Called [employer] to see patient's current monitoring activity per request of Dr End. [employer] Website does not have the daily summaries updated at this time. THis is because they are waiting on insurance to approve and then they can process the daily reports. Representative reassured me that if there was a critical event it would still come through and notify them.  S/w the billing department with [employer]. We confirmed patient's insurance information. She reassured me they would process this immediately. Awaiting approval and website to be updated. Dr End made aware of process.

## 2017-06-30 NOTE — Telephone Encounter (Signed)
Thank you for the update.  Mylin Gignac, MD [facility] Pager: [phone]  

## 2017-06-30 NOTE — Telephone Encounter (Signed)
S/w [employer]. Patient insurance only approved Cardiac Event Monitor, not Mobile Cardiac Telemetry. The only reports that will generate will be if patient triggers or if arrhythmias are auto-detected.   Attempted to reach out to patient to see how she is doing and to make sure she knows to push the button if she feels anything abnormal so that an event will trigger. No answer. Left message to call back.

## 2017-06-30 NOTE — Telephone Encounter (Signed)
S/w [employer] representative. Patient's insurance is going to cover the patient's monitor. Transferred to another department to request the daily summaries and updated monitoring information. Left a message with department requesting this information as soon as possible. I will continue to check [employer] website for updates.

## 2017-07-01 NOTE — Telephone Encounter (Signed)
Hi Heather,  Please let [name] know that I recommend that she should continue to wear the monitor until she presents for her C-section on 7/2.  I have reviewed her monitor results; thus far there is only 1 tracing (baseline transmission).  This showed normal sinus rhythm.  No significant arrhythmias have triggered the monitor.  I will forward these findings to Drs. Georgianne Fick and [employer] as well.  Thanks.  Gerald Stabs

## 2017-07-01 NOTE — Telephone Encounter (Addendum)
The patient called back I and advised her to please make sure she is pushing the button on her monitor to record any time she is having symptoms so a report will be generated. The patient verbalized understanding. She questioned how long she will need to wear the monitor as she is scheduled for a C-Section on 07/05/17.  I advised her I will need to clarify with Dr. Saunders Revel and call her back. The patient voices understanding.   To Dr. Saunders Revel to clarify when the patient can remove the monitor.

## 2017-07-01 NOTE — Telephone Encounter (Signed)
I have notified the patient of Dr. Darnelle Bos recommendations and she voices understanding.

## 2017-07-04 ENCOUNTER — Other Ambulatory Visit: Payer: Self-pay

## 2017-07-04 ENCOUNTER — Ambulatory Visit (INDEPENDENT_AMBULATORY_CARE_PROVIDER_SITE_OTHER): Payer: Commercial Managed Care - PPO | Admitting: Obstetrics and Gynecology

## 2017-07-04 ENCOUNTER — Encounter: Payer: Self-pay | Admitting: Obstetrics and Gynecology

## 2017-07-04 ENCOUNTER — Encounter
Admission: RE | Admit: 2017-07-04 | Discharge: 2017-07-04 | Disposition: A | Discharge status: Home / Self Care | Payer: Commercial Managed Care - PPO | Source: Ambulatory Visit | Attending: Obstetrics and Gynecology | Admitting: Obstetrics and Gynecology

## 2017-07-04 VITALS — BP 116/72 | HR 75 | Ht 67.0 in | Wt 205.0 lb

## 2017-07-04 DIAGNOSIS — O34219 Maternal care for unspecified type scar from previous cesarean delivery: Secondary | ICD-10-CM

## 2017-07-04 DIAGNOSIS — O9989 Other specified diseases and conditions complicating pregnancy, childbirth and the puerperium: Secondary | ICD-10-CM

## 2017-07-04 DIAGNOSIS — R55 Syncope and collapse: Secondary | ICD-10-CM

## 2017-07-04 DIAGNOSIS — Z3A38 38 weeks gestation of pregnancy: Secondary | ICD-10-CM

## 2017-07-04 DIAGNOSIS — Z98891 History of uterine scar from previous surgery: Secondary | ICD-10-CM

## 2017-07-04 DIAGNOSIS — Z3480 Encounter for supervision of other normal pregnancy, unspecified trimester: Secondary | ICD-10-CM

## 2017-07-04 HISTORY — DX: Gastro-esophageal reflux disease without esophagitis: K21.9

## 2017-07-04 LAB — COMPREHENSIVE METABOLIC PANEL
ALT: 11 U/L (ref 0–44)
ANION GAP: 9 (ref 5–15)
AST: 21 U/L (ref 15–41)
Albumin: 2.9 g/dL — ABNORMAL LOW (ref 3.5–5.0)
Alkaline Phosphatase: 113 U/L (ref 38–126)
BILIRUBIN TOTAL: 0.7 mg/dL (ref 0.3–1.2)
BUN: 8 mg/dL (ref 6–20)
CO2: 21 mmol/L — AB (ref 22–32)
Calcium: 8.5 mg/dL — ABNORMAL LOW (ref 8.9–10.3)
Chloride: 106 mmol/L (ref 98–111)
Creatinine, Ser: 0.45 mg/dL (ref 0.44–1.00)
GFR calc Af Amer: >60 mL/min (ref >60)
GFR calc non Af Amer: >60 mL/min (ref >60)
GLUCOSE: 100 mg/dL — AB (ref 70–99)
POTASSIUM: 3.6 mmol/L (ref 3.5–5.1)
Sodium: 136 mmol/L (ref 135–145)
Total Protein: 6.4 g/dL — ABNORMAL LOW (ref 6.5–8.1)

## 2017-07-04 LAB — CBC
HCT: 35.1 % (ref 35.0–47.0)
Hemoglobin: 11.9 g/dL — ABNORMAL LOW (ref 12.0–16.0)
MCH: 28.1 pg (ref 26.0–34.0)
MCHC: 33.9 g/dL (ref 32.0–36.0)
MCV: 82.8 fL (ref 80.0–100.0)
PLATELETS: 273 10*3/uL (ref 150–440)
RBC: 4.24 MIL/uL (ref 3.80–5.20)
RDW: 15.8 % — ABNORMAL HIGH (ref 11.5–14.5)
WBC: 9.7 10*3/uL (ref 3.6–11.0)

## 2017-07-04 LAB — RAPID HIV SCREEN (HIV 1/2 AB+AG)
HIV 1/2 Antibodies: NONREACTIVE
HIV-1 P24 Antigen - HIV24: NONREACTIVE

## 2017-07-04 LAB — TYPE AND SCREEN
ABO/RH(D): A POS
ANTIBODY SCREEN: NEGATIVE
EXTEND SAMPLE REASON: UNDETERMINED

## 2017-07-04 MED ORDER — CEFAZOLIN SODIUM-DEXTROSE 2-4 GM/100ML-% IV SOLN
2.0000 g | INTRAVENOUS | Status: AC
  Administered 2017-07-05: 2 g via INTRAVENOUS
  Filled 2017-07-04: qty 100

## 2017-07-04 NOTE — Progress Notes (Signed)
OB History & Physical   History of Present Illness:  Chief Complaint: pre-operative visit for cesarean delivery with bilateral tubal ligation  HPI:  Stephanie Hayden is a 27 y.o. G2P1001 female at [redacted]w[redacted]d dated by 6 week ultrasound.  Her pregnancy has been complicated by syncopal episodes. She has been evaluted by Cardiology with no clear etiology noted (normal ECHO, no obvious events on Holter monitor). She has been seen by MFM with no clear etiology identified.  Anesthesia has declined to see her pre-operatively based on Cardiology clearance for surgery.  .    She denies contractions.   She denies leakage of fluid.   She denies vaginal bleeding.   She reports fetal movement.    Maternal Medical History:   Past Medical History:  Diagnosis Date  . Endometriosis   . Family history of breast cancer    genetic testing letter sent 5/18  . Phlebitis following infusion    LEFT ARM-AFTER HAVING WISDOME TEETH TAKEN OUT IN APRIL 2017  . Urinary tract infection     Past Surgical History:  Procedure Laterality Date  . ABLATION ON ENDOMETRIOSIS  2017  . CESAREAN SECTION  2011  . CHROMOPERTUBATION N/A 06/06/2015   Procedure: CHROMOPERTUBATION;  Surgeon: Gae Dry, MD;  Location: [facility] ORS;  Service: Gynecology;  Laterality: N/A;  . LAPAROSCOPY N/A 06/06/2015   Procedure: LAPAROSCOPY OPERATIVE/ EXCISION OF ENDOMETRIOSIS;  Surgeon: Gae Dry, MD;  Location: [facility] ORS;  Service: Gynecology;  Laterality: N/A;  . TONSILLECTOMY    . WISDOM TOOTH EXTRACTION  04-2015    Allergies  Allergen Reactions  . Coconut Flavor Anaphylaxis  . Morphine And Related Hives and Swelling    Prior to Admission medications   Medication Sig Start Date End Date Taking? Authorizing Provider  diphenhydramine-acetaminophen (TYLENOL PM) 25-500 MG TABS tablet Take 1 tablet by mouth at bedtime.    Yes [provider]  ondansetron (ZOFRAN-ODT) 4 MG disintegrating tablet Take 1 tablet (4 mg total) by mouth  every 8 (eight) hours as needed for nausea or vomiting. 06/01/17  Yes Malachy Mood, MD  Prenatal Vit-Fe Fumarate-FA (PRENATAL MULTIVITAMIN) TABS tablet Take 1 tablet by mouth daily.    Yes [provider]    OB History  Gravida Para Term Preterm AB Living  2 1 1     1   SAB TAB Ectopic Multiple Live Births          1    # Outcome Date GA Lbr Len/2nd Weight Sex Delivery Anes PTL Lv  2 Current           1 Term     F CS-LTranv   LIV    Prenatal care site: [facility]  Social History: She  reports that she has never smoked. She has never used smokeless tobacco. She reports that she does not drink alcohol or use drugs.  Family History: family history includes Brain cancer in her cousin and paternal grandfather; Breast cancer (age of onset: 86) in her paternal aunt, paternal aunt, and paternal aunt; Diabetes in her father and mother; Heart attack (age of onset: 55) in her father; Heart failure in her mother; Hypertension in her father and mother; Migraines in her mother.   Review of Systems: Negative x 10 systems reviewed except as noted in the HPI.    Physical Exam:  Vital Signs: BP 116/72 (BP Location: Left Arm, Patient Position: Sitting, Cuff Size: Normal)   Pulse 75   Ht 5\' 7"  (1.702  m)   Wt 205 lb (93 kg)   LMP 10/11/2016 (Exact Date)   SpO2 99%   BMI 32.11 kg/m  Constitutional: Well nourished, well developed female in no acute distress.  HEENT: normal Skin: Warm and dry.  Cardiovascular: Regular rate and rhythm.   Extremity: no edema  Respiratory: Clear to auscultation bilateral. Normal respiratory effort Abdomen: FHT present and gravid, NT Back: no CVAT Neuro: DTRs 2+, Cranial nerves grossly intact Psych: Alert and Oriented x3. No memory deficits. Normal mood and affect.  MS: normal gait, normal bilateral lower extremity ROM/strength/stability.  Pertinent Results:  Prenatal Labs: Blood type/Rh A positive  Antibody screen negative  Rubella Not immune    Varicella Immune    RPR NR  HBsAg negative  HIV negative  GC negative  Chlamydia negative  Genetic screening Negative 1st trimester, no msAFP  1 hour GTT 100  3 hour GTT n/a  GBS negative on 06/20/17    Assessment:  Stephanie Hayden is a 85 y.o. G86P1001 female at [redacted]w[redacted]d with history of cesarean delivery, severe syncope with daily symptoms.  She has been cleared from a cardiology standpoint. MFM consultation suggested she might be best served by early-term delivery, which is scheduled for tomorrow.    Plan:  1. Admit to Labor & Delivery  2. CBC, T&S, NPO, IVF 3. GBS negative.   4. Fetwal well-being: reassuring 5. C-section with tubal ligation (states she had a left salpingectomy)   Prentice Docker, MD 07/04/2017 10:55 AM

## 2017-07-04 NOTE — H&P (View-Only) (Signed)
OB History & Physical   History of Present Illness:  Chief Complaint: pre-operative visit for cesarean delivery with bilateral tubal ligation  HPI:  Stephanie Hayden is a 27 y.o. G2P1001 female at [redacted]w[redacted]d dated by 6 week ultrasound.  Her pregnancy has been complicated by syncopal episodes. She has been evaluted by Cardiology with no clear etiology noted (normal ECHO, no obvious events on Holter monitor). She has been seen by MFM with no clear etiology identified.  Anesthesia has declined to see her pre-operatively based on Cardiology clearance for surgery.  .    She denies contractions.   She denies leakage of fluid.   She denies vaginal bleeding.   She reports fetal movement.    Maternal Medical History:   Past Medical History:  Diagnosis Date  . Endometriosis   . Family history of breast cancer    genetic testing letter sent 5/18  . Phlebitis following infusion    LEFT ARM-AFTER HAVING WISDOME TEETH TAKEN OUT IN APRIL 2017  . Urinary tract infection     Past Surgical History:  Procedure Laterality Date  . ABLATION ON ENDOMETRIOSIS  2017  . CESAREAN SECTION  2011  . CHROMOPERTUBATION N/A 06/06/2015   Procedure: CHROMOPERTUBATION;  Surgeon: Gae Dry, MD;  Location: [facility] ORS;  Service: Gynecology;  Laterality: N/A;  . LAPAROSCOPY N/A 06/06/2015   Procedure: LAPAROSCOPY OPERATIVE/ EXCISION OF ENDOMETRIOSIS;  Surgeon: Gae Dry, MD;  Location: [facility] ORS;  Service: Gynecology;  Laterality: N/A;  . TONSILLECTOMY    . WISDOM TOOTH EXTRACTION  04-2015    Allergies  Allergen Reactions  . Coconut Flavor Anaphylaxis  . Morphine And Related Hives and Swelling    Prior to Admission medications   Medication Sig Start Date End Date Taking? Authorizing Provider  diphenhydramine-acetaminophen (TYLENOL PM) 25-500 MG TABS tablet Take 1 tablet by mouth at bedtime.    Yes [provider]  ondansetron (ZOFRAN-ODT) 4 MG disintegrating tablet Take 1 tablet (4 mg total) by mouth  every 8 (eight) hours as needed for nausea or vomiting. 06/01/17  Yes Malachy Mood, MD  Prenatal Vit-Fe Fumarate-FA (PRENATAL MULTIVITAMIN) TABS tablet Take 1 tablet by mouth daily.    Yes [provider]    OB History  Gravida Para Term Preterm AB Living  2 1 1     1   SAB TAB Ectopic Multiple Live Births          1    # Outcome Date GA Lbr Len/2nd Weight Sex Delivery Anes PTL Lv  2 Current           1 Term     F CS-LTranv   LIV    Prenatal care site: [facility]  Social History: She  reports that she has never smoked. She has never used smokeless tobacco. She reports that she does not drink alcohol or use drugs.  Family History: family history includes Brain cancer in her cousin and paternal grandfather; Breast cancer (age of onset: 44) in her paternal aunt, paternal aunt, and paternal aunt; Diabetes in her father and mother; Heart attack (age of onset: 61) in her father; Heart failure in her mother; Hypertension in her father and mother; Migraines in her mother.   Review of Systems: Negative x 10 systems reviewed except as noted in the HPI.    Physical Exam:  Vital Signs: BP 116/72 (BP Location: Left Arm, Patient Position: Sitting, Cuff Size: Normal)   Pulse 75   Ht 5\' 7"  (1.702  m)   Wt 205 lb (93 kg)   LMP 10/11/2016 (Exact Date)   SpO2 99%   BMI 32.11 kg/m  Constitutional: Well nourished, well developed female in no acute distress.  HEENT: normal Skin: Warm and dry.  Cardiovascular: Regular rate and rhythm.   Extremity: no edema  Respiratory: Clear to auscultation bilateral. Normal respiratory effort Abdomen: FHT present and gravid, NT Back: no CVAT Neuro: DTRs 2+, Cranial nerves grossly intact Psych: Alert and Oriented x3. No memory deficits. Normal mood and affect.  MS: normal gait, normal bilateral lower extremity ROM/strength/stability.  Pertinent Results:  Prenatal Labs: Blood type/Rh A positive  Antibody screen negative  Rubella Not immune    Varicella Immune    RPR NR  HBsAg negative  HIV negative  GC negative  Chlamydia negative  Genetic screening Negative 1st trimester, no msAFP  1 hour GTT 100  3 hour GTT n/a  GBS negative on 06/20/17    Assessment:  Stephanie Hayden is a 27 y.o. G36P1001 female at [redacted]w[redacted]d with history of cesarean delivery, severe syncope with daily symptoms.  She has been cleared from a cardiology standpoint. MFM consultation suggested she might be best served by early-term delivery, which is scheduled for tomorrow.    Plan:  1. Admit to Labor & Delivery  2. CBC, T&S, NPO, IVF 3. GBS negative.   4. Fetwal well-being: reassuring 5. C-section with tubal ligation (states she had a left salpingectomy)   Prentice Docker, MD 07/04/2017 10:55 AM

## 2017-07-04 NOTE — Patient Instructions (Signed)
Your procedure is scheduled on: July 05, 2017  Report to [facility] AT 5:30 AM    Remember: Instructions that are not followed completely may result in serious medical risk,  up to and including death, or upon the discretion of your surgeon and anesthesiologist your  surgery may need to be rescheduled.     _X__ 1. Do not eat food after midnight the night before your procedure.                 No gum chewing or hard candies.                  You may drink clear liquids up to 2 hours before you are scheduled to arrive for your surgery-                   DO not drink clear liquids within 2 hours of the start of your surgery.                  Clear Liquids include:  water, apple juice without pulp, clear carbohydrate                 drink such as Clearfast of Gatorade, Black Coffee or Tea (Do not add                 anything to coffee or tea).  __X__2.  On the morning of surgery brush your teeth with toothpaste and water, you                may rinse your mouth with mouthwash if you wish.                   Do not swallow any toothpaste of mouthwash.     _X__ 3.  No Alcohol for 24 hours before or after surgery.   _X__ 4.  Do Not Smoke or use e-cigarettes For 24 Hours Prior to Your Surgery.                 Do not use any chewable tobacco products for at least 6 hours prior to                 surgery.  ____  5.  Bring all medications with you on the day of surgery if instructed.   ____  6.  Notify your doctor if there is any change in your medical condition      (cold, fever, infections).     Do not wear jewelry, make-up, hairpins, clips or nail polish. Do not wear lotions, powders, or perfumes. You may wear deodorant. Do not shave 48 hours prior to surgery. Men may shave face and neck. Do not bring valuables to the hospital.    [facility] is not responsible for any belongings or valuables.  Contacts, dentures or bridgework may not be worn into  surgery. Leave your suitcase in the car. After surgery it may be brought to your room. For patients admitted to the hospital, discharge time is determined by your treatment team.   Patients discharged the day of surgery will not be allowed to drive home.   Please read over the following fact sheets that you were given:   PREPARING FOR SURGERY   ____ Take these medicines the morning of surgery with A SIP OF WATER:    1. ZOFRAN, IF NEEDED IN THE MORNING  2.   3.   4.  5.  6.  ____ Fleet Enema (as directed)   _X___ Use CHG Soap as directed. DO NOT USE ON YOUR NIPPLES  _X___ Stop ASPIRIN PRODUCTS NOW  _X___ Stop Anti-inflammatories TODAY   ____ Stop supplements until after surgery.    ____ Bring C-Pap to the hospital.   [place]

## 2017-07-05 ENCOUNTER — Inpatient Hospital Stay: Payer: Commercial Managed Care - PPO | Admitting: Anesthesiology

## 2017-07-05 ENCOUNTER — Inpatient Hospital Stay
Admission: RE | Admit: 2017-07-05 | Discharge: 2017-07-08 | DRG: 784 | Disposition: A | Discharge status: Home / Self Care | Payer: Commercial Managed Care - PPO | Attending: Obstetrics and Gynecology | Admitting: Obstetrics and Gynecology

## 2017-07-05 ENCOUNTER — Other Ambulatory Visit: Payer: Self-pay

## 2017-07-05 ENCOUNTER — Encounter
Admission: RE | Disposition: A | Discharge status: Home / Self Care | Payer: Self-pay | Source: Home / Self Care | Attending: Obstetrics and Gynecology

## 2017-07-05 ENCOUNTER — Encounter: Payer: Commercial Managed Care - PPO | Admitting: Obstetrics and Gynecology

## 2017-07-05 DIAGNOSIS — Z302 Encounter for sterilization: Secondary | ICD-10-CM

## 2017-07-05 DIAGNOSIS — O9081 Anemia of the puerperium: Secondary | ICD-10-CM | POA: Diagnosis not present

## 2017-07-05 DIAGNOSIS — Z3480 Encounter for supervision of other normal pregnancy, unspecified trimester: Secondary | ICD-10-CM

## 2017-07-05 DIAGNOSIS — R55 Syncope and collapse: Secondary | ICD-10-CM | POA: Diagnosis present

## 2017-07-05 DIAGNOSIS — D62 Acute posthemorrhagic anemia: Secondary | ICD-10-CM | POA: Diagnosis not present

## 2017-07-05 DIAGNOSIS — O34211 Maternal care for low transverse scar from previous cesarean delivery: Secondary | ICD-10-CM | POA: Diagnosis present

## 2017-07-05 DIAGNOSIS — O34219 Maternal care for unspecified type scar from previous cesarean delivery: Secondary | ICD-10-CM

## 2017-07-05 DIAGNOSIS — O26893 Other specified pregnancy related conditions, third trimester: Secondary | ICD-10-CM | POA: Diagnosis present

## 2017-07-05 DIAGNOSIS — Z98891 History of uterine scar from previous surgery: Secondary | ICD-10-CM

## 2017-07-05 DIAGNOSIS — Z3A38 38 weeks gestation of pregnancy: Secondary | ICD-10-CM

## 2017-07-05 LAB — RPR: RPR: NONREACTIVE

## 2017-07-05 SURGERY — Surgical Case
Anesthesia: General

## 2017-07-05 MED ORDER — OXYTOCIN 40 UNITS IN LACTATED RINGERS INFUSION - SIMPLE MED
INTRAVENOUS | Status: AC
  Filled 2017-07-05: qty 1000

## 2017-07-05 MED ORDER — OXYTOCIN 40 UNITS IN LACTATED RINGERS INFUSION - SIMPLE MED
INTRAVENOUS | Status: DC | PRN
  Administered 2017-07-05: 1000 mL via INTRAVENOUS

## 2017-07-05 MED ORDER — DIPHENHYDRAMINE HCL 25 MG PO CAPS
25.0000 mg | ORAL_CAPSULE | Freq: Four times a day (QID) | ORAL | Status: DC | PRN
  Administered 2017-07-06: 25 mg via ORAL
  Filled 2017-07-05: qty 1

## 2017-07-05 MED ORDER — OXYCODONE-ACETAMINOPHEN 5-325 MG PO TABS
1.0000 | ORAL_TABLET | ORAL | Status: DC | PRN
Start: 2017-07-05 — End: 2017-07-08
  Administered 2017-07-05 – 2017-07-07 (×6): 1 via ORAL
  Filled 2017-07-05 (×6): qty 1

## 2017-07-05 MED ORDER — FENTANYL CITRATE (PF) 100 MCG/2ML IJ SOLN: 25.0000 ug | INTRAMUSCULAR | Status: DC | PRN

## 2017-07-05 MED ORDER — SODIUM CHLORIDE 0.9 % IV SOLN
INTRAVENOUS | Status: DC | PRN
  Administered 2017-07-05: 50 ug/min via INTRAVENOUS

## 2017-07-05 MED ORDER — IBUPROFEN 600 MG PO TABS
600.0000 mg | ORAL_TABLET | Freq: Four times a day (QID) | ORAL | Status: DC
  Administered 2017-07-06 – 2017-07-08 (×8): 600 mg via ORAL
  Filled 2017-07-05 (×8): qty 1

## 2017-07-05 MED ORDER — ONDANSETRON HCL 4 MG/2ML IJ SOLN
INTRAMUSCULAR | Status: DC | PRN
  Administered 2017-07-05: 4 mg via INTRAVENOUS

## 2017-07-05 MED ORDER — SUCCINYLCHOLINE CHLORIDE 20 MG/ML IJ SOLN
INTRAMUSCULAR | Status: AC
  Filled 2017-07-05: qty 1

## 2017-07-05 MED ORDER — BUPIVACAINE 0.25 % ON-Q PUMP DUAL CATH 400 ML
400.0000 mL | INJECTION | Status: DC
  Filled 2017-07-05: qty 400

## 2017-07-05 MED ORDER — ONDANSETRON HCL 4 MG/2ML IJ SOLN: 4.0000 mg | Freq: Once | INTRAMUSCULAR | Status: DC | PRN

## 2017-07-05 MED ORDER — SODIUM CHLORIDE FLUSH 0.9 % IV SOLN
INTRAVENOUS | Status: AC
  Filled 2017-07-05: qty 10

## 2017-07-05 MED ORDER — EPHEDRINE SULFATE 50 MG/ML IJ SOLN
INTRAMUSCULAR | Status: DC | PRN
  Administered 2017-07-05 (×3): 10 mg via INTRAVENOUS

## 2017-07-05 MED ORDER — OXYCODONE-ACETAMINOPHEN 5-325 MG PO TABS
2.0000 | ORAL_TABLET | ORAL | Status: DC | PRN
  Administered 2017-07-05 – 2017-07-08 (×7): 2 via ORAL
  Filled 2017-07-05 (×8): qty 2

## 2017-07-05 MED ORDER — ONDANSETRON HCL 4 MG/2ML IJ SOLN
INTRAMUSCULAR | Status: AC
  Filled 2017-07-05: qty 2

## 2017-07-05 MED ORDER — PHENYLEPHRINE HCL 10 MG/ML IJ SOLN
INTRAMUSCULAR | Status: AC
  Filled 2017-07-05: qty 1

## 2017-07-05 MED ORDER — PRENATAL MULTIVITAMIN CH
1.0000 | ORAL_TABLET | Freq: Every day | ORAL | Status: DC
  Administered 2017-07-06: 1 via ORAL
  Filled 2017-07-05 (×3): qty 1

## 2017-07-05 MED ORDER — FENTANYL CITRATE (PF) 100 MCG/2ML IJ SOLN
INTRAMUSCULAR | Status: DC | PRN
  Administered 2017-07-05: 20 ug via INTRATHECAL

## 2017-07-05 MED ORDER — MEASLES, MUMPS & RUBELLA VAC ~~LOC~~ INJ: 0.5000 mL | INJECTION | SUBCUTANEOUS | Status: DC | PRN

## 2017-07-05 MED ORDER — SOD CITRATE-CITRIC ACID 500-334 MG/5ML PO SOLN
ORAL | Status: AC
  Administered 2017-07-05: 30 mL via ORAL
  Filled 2017-07-05: qty 15

## 2017-07-05 MED ORDER — BUPIVACAINE HCL (PF) 0.5 % IJ SOLN: 5.0000 mL | Freq: Once | INTRAMUSCULAR | Status: DC

## 2017-07-05 MED ORDER — ATROPINE SULFATE 0.4 MG/ML IJ SOLN
INTRAMUSCULAR | Status: AC
  Filled 2017-07-05: qty 1

## 2017-07-05 MED ORDER — GLYCOPYRROLATE 0.2 MG/ML IJ SOLN
INTRAMUSCULAR | Status: AC
  Filled 2017-07-05: qty 1

## 2017-07-05 MED ORDER — MENTHOL 3 MG MT LOZG
1.0000 | LOZENGE | OROMUCOSAL | Status: DC | PRN
  Filled 2017-07-05: qty 9

## 2017-07-05 MED ORDER — PROPOFOL 500 MG/50ML IV EMUL
INTRAVENOUS | Status: AC
  Filled 2017-07-05: qty 50

## 2017-07-05 MED ORDER — DIBUCAINE 1 % RE OINT: 1.0000 "application " | TOPICAL_OINTMENT | RECTAL | Status: DC | PRN

## 2017-07-05 MED ORDER — KETOROLAC TROMETHAMINE 30 MG/ML IJ SOLN
INTRAMUSCULAR | Status: DC | PRN
  Administered 2017-07-05: 30 mg via INTRAVENOUS

## 2017-07-05 MED ORDER — FENTANYL CITRATE (PF) 100 MCG/2ML IJ SOLN
INTRAMUSCULAR | Status: AC
  Filled 2017-07-05: qty 2

## 2017-07-05 MED ORDER — SOD CITRATE-CITRIC ACID 500-334 MG/5ML PO SOLN
30.0000 mL | ORAL | Status: AC
  Administered 2017-07-05: 30 mL via ORAL

## 2017-07-05 MED ORDER — KETOROLAC TROMETHAMINE 30 MG/ML IJ SOLN
INTRAMUSCULAR | Status: AC
  Filled 2017-07-05: qty 1

## 2017-07-05 MED ORDER — SIMETHICONE 80 MG PO CHEW
80.0000 mg | CHEWABLE_TABLET | Freq: Three times a day (TID) | ORAL | Status: DC
  Administered 2017-07-05 – 2017-07-08 (×9): 80 mg via ORAL
  Filled 2017-07-05 (×9): qty 1

## 2017-07-05 MED ORDER — LACTATED RINGERS IV SOLN: INTRAVENOUS | Status: DC

## 2017-07-05 MED ORDER — LACTATED RINGERS IV SOLN
INTRAVENOUS | Status: DC
  Administered 2017-07-05: 06:00:00 via INTRAVENOUS

## 2017-07-05 MED ORDER — FERROUS SULFATE 325 (65 FE) MG PO TABS
325.0000 mg | ORAL_TABLET | Freq: Two times a day (BID) | ORAL | Status: DC
  Administered 2017-07-05 – 2017-07-08 (×6): 325 mg via ORAL
  Filled 2017-07-05 (×6): qty 1

## 2017-07-05 MED ORDER — ATROPINE SULFATE 0.4 MG/ML IJ SOLN
INTRAMUSCULAR | Status: DC | PRN
  Administered 2017-07-05: 0.4 mg via INTRAVENOUS

## 2017-07-05 MED ORDER — BUPIVACAINE IN DEXTROSE 0.75-8.25 % IT SOLN
INTRATHECAL | Status: DC | PRN
  Administered 2017-07-05: 2 mL via INTRATHECAL

## 2017-07-05 MED ORDER — OXYTOCIN 40 UNITS IN LACTATED RINGERS INFUSION - SIMPLE MED
2.5000 [IU]/h | INTRAVENOUS | Status: AC
  Filled 2017-07-05: qty 1000

## 2017-07-05 MED ORDER — WITCH HAZEL-GLYCERIN EX PADS: 1.0000 "application " | MEDICATED_PAD | CUTANEOUS | Status: DC | PRN

## 2017-07-05 MED ORDER — KETOROLAC TROMETHAMINE 15 MG/ML IJ SOLN
15.0000 mg | Freq: Four times a day (QID) | INTRAMUSCULAR | Status: AC
  Administered 2017-07-05 (×2): 15 mg via INTRAVENOUS
  Filled 2017-07-05 (×3): qty 1

## 2017-07-05 MED ORDER — COCONUT OIL OIL: 1.0000 "application " | TOPICAL_OIL | Status: DC | PRN

## 2017-07-05 MED ORDER — EPHEDRINE SULFATE 50 MG/ML IJ SOLN
INTRAMUSCULAR | Status: AC
  Filled 2017-07-05: qty 1

## 2017-07-05 MED ORDER — BUPIVACAINE HCL (PF) 0.5 % IJ SOLN
5.0000 mL | Freq: Once | INTRAMUSCULAR | Status: DC
  Filled 2017-07-05: qty 30

## 2017-07-05 MED ORDER — SENNOSIDES-DOCUSATE SODIUM 8.6-50 MG PO TABS
2.0000 | ORAL_TABLET | ORAL | Status: DC
  Administered 2017-07-06 – 2017-07-08 (×3): 2 via ORAL
  Filled 2017-07-05 (×3): qty 2

## 2017-07-05 SURGICAL SUPPLY — 38 items
ADH SKN CLS APL DERMABOND .7 (GAUZE/BANDAGES/DRESSINGS) ×1
CANISTER SUCT 3000ML PPV (MISCELLANEOUS) ×3 IMPLANT
CATH KIT ON-Q SILVERSOAK 5 (CATHETERS) ×2 IMPLANT
CATH KIT ON-Q SILVERSOAK 5IN (CATHETERS) ×6 IMPLANT
CHLORAPREP W/TINT 26ML (MISCELLANEOUS) ×6 IMPLANT
CLOSURE WOUND 1/2 X4 (GAUZE/BANDAGES/DRESSINGS) ×1
CUP MEDICINE 2OZ PLAST GRAD ST (MISCELLANEOUS) ×3 IMPLANT
DERMABOND ADVANCED (GAUZE/BANDAGES/DRESSINGS) ×2
DERMABOND ADVANCED .7 DNX12 (GAUZE/BANDAGES/DRESSINGS) ×1 IMPLANT
DRSG OPSITE POSTOP 4X10 (GAUZE/BANDAGES/DRESSINGS) ×3 IMPLANT
DRSG TELFA 3X8 NADH (GAUZE/BANDAGES/DRESSINGS) ×3 IMPLANT
ELECT REM PT RETURN 9FT ADLT (ELECTROSURGICAL) ×3
ELECTRODE REM PT RTRN 9FT ADLT (ELECTROSURGICAL) ×1 IMPLANT
GAUZE SPONGE 4X4 12PLY STRL (GAUZE/BANDAGES/DRESSINGS) ×3 IMPLANT
GLOVE BIO SURGEON STRL SZ8 (GLOVE) ×11 IMPLANT
GLOVE BIOGEL PI IND STRL 7.5 (GLOVE) ×1 IMPLANT
GLOVE BIOGEL PI INDICATOR 7.5 (GLOVE) ×10
GLOVE SKINSENSE NS SZ7.0 (GLOVE) ×2
GLOVE SKINSENSE STRL SZ7.0 (GLOVE) ×1 IMPLANT
GOWN STRL REUS W/ TWL LRG LVL3 (GOWN DISPOSABLE) ×2 IMPLANT
GOWN STRL REUS W/TWL LRG LVL3 (GOWN DISPOSABLE) ×6
NEEDLE HYPO 22GX1.5 SAFETY (NEEDLE) ×3 IMPLANT
NS IRRIG 1000ML POUR BTL (IV SOLUTION) ×3 IMPLANT
PACK C SECTION AR (MISCELLANEOUS) ×3 IMPLANT
PAD DRESSING TELFA 3X8 NADH (GAUZE/BANDAGES/DRESSINGS) ×1 IMPLANT
PAD OB MATERNITY 4.3X12.25 (PERSONAL CARE ITEMS) ×6 IMPLANT
PAD PREP 24X41 OB/GYN DISP (PERSONAL CARE ITEMS) ×3 IMPLANT
STRIP CLOSURE SKIN 1/2X4 (GAUZE/BANDAGES/DRESSINGS) ×2 IMPLANT
SUT 2-0 PL GUT LIGAPAK (SUTURE) ×5 IMPLANT
SUT MNCRL AB 4-0 PS2 18 (SUTURE) ×3 IMPLANT
SUT PDS AB 1 TP1 96 (SUTURE) ×4 IMPLANT
SUT PLAIN 2 0 XLH (SUTURE) ×2 IMPLANT
SUT VIC AB 0 CT1 36 (SUTURE) ×4 IMPLANT
SUT VIC AB 0 CTX 36 (SUTURE) ×6
SUT VIC AB 0 CTX36XBRD ANBCTRL (SUTURE) IMPLANT
SUT VIC AB 1 CT1 36 (SUTURE) ×1 IMPLANT
SWABSTK COMLB BENZOIN TINCTURE (MISCELLANEOUS) ×3 IMPLANT
SYR 10ML LL (SYRINGE) ×3 IMPLANT

## 2017-07-05 NOTE — Op Note (Signed)
Cesarean Section Operative Note    Stephanie Hayden   07/05/2017   Pre-operative Diagnosis:  1) intrauterine pregnancy at [redacted]w[redacted]d 2) history of cesarean delivery, desires repeat 3) desires permanent sterility  Post-operative Diagnosis:  1) intrauterine pregnancy at [redacted]w[redacted]d 2) history of cesarean delivery, desires repeat 3) desires permanent sterility   Procedure:  1) Repeat low transverse cesarean section via Pfannenstiel incision with double layer uterine closure 2) Bilateral tubal ligation via Pomeroy method  Surgeon: Surgeon(s) and Role:    * Will Bonnet, MD - Primary   Assistants: Floyce Stakes, RN  Anesthesia: Spinal   Findings:  1) normal appearing gravid uterus and fallopian tubes 2) normal-appearing right ovary 3) surgically absent left ovary 4) Viable female infant with weight of 3,260 grams and APGARs of 9 and 9   Estimated Blood Loss: 800 mL  Total IV Fluids: 1,400 ml crystalloid  Urine Output: 150 mL clear urine at end of case  Specimens: portion of right and left fallopian tubes  Complications: no complications  Disposition: PACU - hemodynamically stable.   Maternal Condition: stable   Baby condition / location:  Couplet care / Skin to Skin  Procedure Details:  The patient was seen in the Holding Room. The risks, benefits, complications, treatment options, and expected outcomes were discussed with the patient. The patient concurred with the proposed plan, giving informed consent. identified as Stephanie Hayden and the procedure verified as C-Section Delivery. A Time Out was held and the above information confirmed.   After induction of anesthesia, the patient was draped and prepped in the usual sterile manner. A Pfannenstiel incision was made and carried down through the subcutaneous tissue to the fascia. Fascial incision was made and extended transversely. The fascia was separated from the underlying rectus tissue superiorly and inferiorly. The  peritoneum was identified and entered. Peritoneal incision was extended longitudinally. The bladder flap was not freed from the lower uterine segment. A low transverse uterine incision was made and the hysterotomy was extended with cranial-caudal tension. Delivered from cephalic presentation was a 3,260 gram Living newborn infant(s) or Female with Apgar scores of 9 at one minute and 9 at five minutes. Cord ph was not sent the umbilical cord was clamped and cut cord blood was not obtained for evaluation. The placenta was removed Intact and appeared normal. The uterine outline, tubes, and right ovary appeared normal with a noted absent left ovary consistent with her clinical history. The uterine incision was closed with running locked sutures of 0 Vicryl.  A second layer of the same suture was thrown in an imbricating fashion.  Hemostasis was assured.    The tubal ligation portion of the procedure was performed at this point.  The left fallopian tube was identified and followed out to the fimbriated end.  A Babcock clamp was used to grasp the tube in the mid-isthmic portion and two 2-0 plain gut sutures were used to ligate the tube.  An approximately 3cm segment of tube was removed with hemostasis assured.  The same procedure was performed on the right fallopian tube with hemostasis noted.   The uterus was returned to the abdomen and the paracolic gutters were cleared of all clots and debris.  The rectus muscles were inspected and found to be hemostatic.  The On-Q catheter pumps were inserted in accordance with the manufacturer's recommendations.  The catheters were inserted approximately 4cm cephelad to the incision line, approximately 1cm apart, straddling the midline.  They were inserted to a  depth of the 4th mark. They were positioned superficial to the rectus abdominus muscles and deep to the rectus fascia.    The fascia was then reapproximated with running sutures of 1-0 PDS, looped. The subcutaneous  tissue was reapproximated using 2-0 plain gut such that no greater than 2cm of dead space remained. The subcuticular closure was performed using 4-0 monocryl. The skin closure was reinforced using surgical skin glue.  The On-Q catheters were bolused with 5 mL of 0.5% marcaine plain for a total of 10 mL.  One of the catheters gave a large amount of resistance to the bolus. So, this was removed to verify it was not sewn in with the closure. It removed easily.  The remaining catheter was affixed to the skin with surgical skin glue, steri-strips, and tegaderm.    Instrument, sponge, and needle counts were correct prior the abdominal closure and were correct at the conclusion of the case.  The patient received Ancef 2 gram IV prior to skin incision (within 30 minutes). For VTE prophylaxis she was wearing SCDs throughout the case.    Signed: Will Bonnet, MD 07/05/2017 9:08 AM

## 2017-07-05 NOTE — Interval H&P Note (Signed)
History and Physical Interval Note:  07/05/2017 7:23 AM  Stephanie Hayden  has presented today for surgery, with the diagnosis of REPEAT CESAREAN  The various methods of treatment have been discussed with the patient and family. After consideration of risks, benefits and other options for treatment, the patient has consented to  Procedure(s): [place] (N/A) as a surgical intervention .  The patient's history has been reviewed, patient examined, no change in status, stable for surgery.  I have reviewed the patient's chart and labs.  Questions were answered to the patient's satisfaction.    Prentice Docker, MD, [facility], [facility] 07/05/2017 7:23 AM

## 2017-07-05 NOTE — Discharge Summary (Addendum)
OB Discharge Summary     Patient Name: Stephanie Hayden DOB: June 09, 1990 MRN: [id number]  Date of admission: 07/05/2017 Delivering MD: Prentice Docker, MD  Date of Delivery: 07/05/2017  Date of discharge: 07/08/2017  Admitting diagnosis:  1) intrauterine pregnancy at [redacted]w[redacted]d  2) history of cesarean section, desires repeat  3) desires permanent sterilization  Intrauterine pregnancy: [redacted]w[redacted]d     Secondary diagnosis: repeated syncopal episodes of unknown origin     Discharge diagnosis: Term Pregnancy Delivered , repeat low transverse Cesarean section and bilateral tubal ligation                                                                                             Post partum procedures:none  Augmentation: n/a  Complications: None  Hospital course:  Sceduled C/S   27 y.o. yo G2P2002 at [redacted]w[redacted]d was admitted to the hospital 07/05/2017 for scheduled cesarean section with the following indication:Elective Repeat.  Membrane Rupture Time/Date: 8:08 AM ,07/05/2017   Patient delivered a Viable infant.07/05/2017  Details of operation can be found in separate operative note.  Pateint had an uncomplicated postpartum course.  She was monitored on telemetry for 23 hours with no events.  She is ambulating, tolerating a regular diet, passing flatus, and urinating well. Her ON Q pump and honey comb dressing was removed per her request, prior to discharge Patient is discharged home in stable condition on  07/08/2017        Physical exam  Vitals:   07/07/17 0752 07/07/17 1631 07/08/17 0100 07/08/17 0758  BP: 99/61 110/69 108/67 (!) 100/51  Pulse: 60 62 62 (!) 59  Resp: 18 20 18 18   Temp: 97.6 F (36.4 C) 97.7 F (36.5 C) 98.1 F (36.7 C) 98 F (36.7 C)  TempSrc: Oral Oral Oral Oral  SpO2: 100%  99% 98%  Weight:      Height:       General: alert, cooperative and no distress Lochia: appropriate Uterine Fundus: firm Incision: Healing well with no significant drainage DVT Evaluation: No evidence of DVT  seen on physical exam.  Labs: Lab Results  Component Value Date   WBC 11.3 (H) 07/06/2017   HGB 9.8 (L) 07/06/2017   HCT 29.0 (L) 07/06/2017   MCV 82.4 07/06/2017   PLT 213 07/06/2017    Discharge instruction: per After Visit Summary.  Medications:  Allergies as of 07/08/2017      Reactions   Coconut Flavor Anaphylaxis   Morphine And Related Hives, Swelling   Swelling of airway. Benadryl controlled reaction when she used morphine last time      Medication List    STOP taking these medications   diphenhydrAMINE 25 MG tablet Commonly known as:  BENADRYL   diphenhydramine-acetaminophen 25-500 MG Tabs tablet Commonly known as:  TYLENOL PM   ondansetron 4 MG disintegrating tablet Commonly known as:  ZOFRAN-ODT     TAKE these medications   ibuprofen 600 MG tablet Commonly known as:  ADVIL,MOTRIN Take 1 tablet (600 mg total) by mouth every 6 (six) hours.   oxyCODONE-acetaminophen 5-325 MG tablet Commonly known as:  PERCOCET/ROXICET Take 1-2 tablets by mouth  every 6 (six) hours as needed for up to 7 days for moderate pain (pain score 4-7/10).   prenatal multivitamin Tabs tablet Take 1 tablet by mouth daily.       Diet: routine diet  Activity: Advance as tolerated. Pelvic rest for 6 weeks.   Outpatient follow up: Follow-up Information    Will Bonnet, MD. Go on 07/15/2017.   Specialty:  Obstetrics and Gynecology Why:  Postpartum follow up appointment on Friday July 12th at 11:30 AM with Dr. Glennon Mac for an incision check Contact information: [address] [place] [address] [phone]             Postpartum contraception: Tubal Ligation Rhogam Given postpartum: no Rubella vaccine given postpartum: ordered Varicella vaccine given postpartum: no TDaP given antepartum or postpartum: 05/18/17  Newborn Data: Live born female / [place] Birth Weight: 7 lb 3 oz (3260 g) APGAR: 9, 9  Newborn Delivery   Birth date/time:  07/05/2017  08:08:00 Delivery type:  C-Section, Low Transverse Trial of labor:  No C-section categorization:  Repeat      Baby Feeding: Breast  Disposition:home with mother  SIGNED: Dalia Heading

## 2017-07-05 NOTE — Anesthesia Post-op Follow-up Note (Signed)
Anesthesia QCDR form completed.        

## 2017-07-05 NOTE — Transfer of Care (Signed)
Immediate Anesthesia Transfer of Care Note  Patient: Stephanie Hayden  Procedure(s) Performed: CESAREAN SECTION WITH BILATERAL TUBAL LIGATION (N/A )  Patient Location: PACU  Anesthesia Type:Spinal  Level of Consciousness: awake  Airway & Oxygen Therapy: Patient Spontanous Breathing  Post-op Assessment: Report given to RN and Post -op Vital signs reviewed and stable  Post vital signs: Reviewed and stable  Last Vitals:  Vitals Value Taken Time  BP 103/68 07/05/2017  9:20 AM  Temp 36 C 07/05/2017  9:20 AM  Pulse 77 07/05/2017  9:20 AM  Resp 15 07/05/2017  9:20 AM  SpO2 100 % 07/05/2017  9:20 AM    Last Pain:  Vitals:   07/05/17 0920  TempSrc: Temporal  PainSc:          Complications: No apparent anesthesia complications

## 2017-07-05 NOTE — Anesthesia Procedure Notes (Addendum)
Spinal  Patient location during procedure: OR Start time: 07/05/2017 7:32 AM Staffing Anesthesiologist: Molli Barrows, MD Resident/CRNA: Jonna Clark, CRNA Performed: anesthesiologist  Preanesthetic Checklist Completed: patient identified, site marked, surgical consent, pre-op evaluation, timeout performed, IV checked, risks and benefits discussed and monitors and equipment checked Spinal Block Patient position: sitting Prep: Betadine Patient monitoring: heart rate, continuous pulse ox, blood pressure and cardiac monitor Approach: midline Location: L4-5 Injection technique: single-shot Needle Needle type: Whitacre and Introducer  Needle gauge: 27 G Needle length: 9 cm Assessment Sensory level: T4 Events: paresthesia Additional Notes Initial pass with 24 pencan needle yielded left side paresthesia and resolved spontaneously.  Repeat with 27 whittacre yielded .Marland KitchenNegative paresthesia. Negative blood return. Positive free-flowing CSF. Expiration date of kit checked and confirmed. Patient tolerated procedure well, without complications.

## 2017-07-05 NOTE — Progress Notes (Signed)
Pt arrived to BP with s/o at side in no apparent distress. Pt denies any signs of labor or pain. Confirms +FM. Agrees with plan for scheduled c/s and BTL today. Discussed what to expect and addressed pt questions and concerns. Pt and s/o oriented to room and unit. Last ate at 2000 last night and drank around 0500a, sips of water. Pt to remove all jewelry and metal.

## 2017-07-05 NOTE — Lactation Note (Signed)
This note was copied from a baby's chart. Lactation Consultation Note  Patient Name: Stephanie Hayden [id number] Date: 07/05/2017 Reason for consult: Follow-up assessment   Maternal Data    Feeding Feeding Type: Breast Fed  LATCH Score                   Interventions Interventions: Breast feeding basics reviewed  Lactation Tools Discussed/Used     Consult Status  LC followed up with mother. Mother states that she breastfeed infant recently and infant breast fed for 30 minutes. Mother denies additional concerns at this time.    Elvera Lennox 0/08/6759, 4:53 PM

## 2017-07-05 NOTE — Anesthesia Preprocedure Evaluation (Signed)
Anesthesia Evaluation  Patient identified by MRN, date of birth, ID band Patient awake    Reviewed: Allergy & Precautions, H&P , NPO status , Patient's Chart, lab work & pertinent test results, reviewed documented beta blocker date and time   Airway Mallampati: II   Neck ROM: full    Dental  (+) Poor Dentition   Pulmonary neg pulmonary ROS,    Pulmonary exam normal        Cardiovascular Exercise Tolerance: Good negative cardio ROS Normal cardiovascular exam+ dysrhythmias  Rhythm:regular Rate:Normal  Recent holter for arrythmia and rule out and benign findings.  This was performed secondary to some pre syncopal episodes which have not recurred.  JA   Neuro/Psych  Headaches, negative neurological ROS  negative psych ROS   GI/Hepatic negative GI ROS, Neg liver ROS, GERD  Medicated,  Endo/Other  negative endocrine ROS  Renal/GU negative Renal ROS  negative genitourinary   Musculoskeletal   Abdominal   Peds  Hematology negative hematology ROS (+)   Anesthesia Other Findings Past Medical History: No date: Endometriosis No date: Family history of breast cancer     Comment:  genetic testing letter sent 5/18 No date: GERD (gastroesophageal reflux disease)     Comment:  ONLY WHEN PREGNANT No date: Phlebitis following infusion     Comment:  LEFT ARM-AFTER HAVING WISDOME TEETH TAKEN OUT IN APRIL               2017 No date: Urinary tract infection Past Surgical History: 2017: ABLATION ON ENDOMETRIOSIS 2011: CESAREAN SECTION 06/06/2015: CHROMOPERTUBATION; N/A     Comment:  Procedure: CHROMOPERTUBATION;  Surgeon: Gae Dry,              MD;  Location: [facility] ORS;  Service: Gynecology;                Laterality: N/A; 06/06/2015: LAPAROSCOPY; N/A     Comment:  Procedure: LAPAROSCOPY OPERATIVE/ EXCISION OF               ENDOMETRIOSIS;  Surgeon: Gae Dry, MD;  Location:               [facility] ORS;  Service: Gynecology;   Laterality: N/A; No date: TONSILLECTOMY 04-2015: WISDOM TOOTH EXTRACTION BMI    Body Mass Index:  31.64 kg/m     Reproductive/Obstetrics negative OB ROS                             Anesthesia Physical Anesthesia Plan  ASA: II  Anesthesia Plan: General and Spinal   Post-op Pain Management:    Induction:   PONV Risk Score and Plan: 4 or greater  Airway Management Planned:   Additional Equipment:   Intra-op Plan:   Post-operative Plan:   Informed Consent: I have reviewed the patients History and Physical, chart, labs and discussed the procedure including the risks, benefits and alternatives for the proposed anesthesia with the patient or authorized representative who has indicated his/her understanding and acceptance.   Dental Advisory Given  Plan Discussed with: CRNA  Anesthesia Plan Comments:         Anesthesia Quick Evaluation

## 2017-07-05 NOTE — Lactation Note (Signed)
This note was copied from a baby's chart. Lactation Consultation Note  Patient Name: Stephanie Hayden [id number] Date: 07/05/2017 Reason for consult: Initial assessment   Maternal Data    Feeding Feeding Type: Breast Fed  LATCH Score                   Interventions Interventions: Breast feeding basics reviewed;Assisted with latch;Skin to skin  Lactation Tools Discussed/Used     Consult Status  LC assisted patient with attempting to latch infant. Infant was very sleepy with hands relaxed. Mother educated on latch, positioning, infant hunger cues and infant was placed skin to skin.    Elvera Lennox 04/07/100, 4:44 PM

## 2017-07-06 ENCOUNTER — Encounter: Payer: Self-pay | Admitting: Obstetrics and Gynecology

## 2017-07-06 LAB — CBC
HEMATOCRIT: 29 % — AB (ref 35.0–47.0)
HEMOGLOBIN: 9.8 g/dL — AB (ref 12.0–16.0)
MCH: 27.8 pg (ref 26.0–34.0)
MCHC: 33.8 g/dL (ref 32.0–36.0)
MCV: 82.4 fL (ref 80.0–100.0)
Platelets: 213 10*3/uL (ref 150–440)
RBC: 3.52 MIL/uL — AB (ref 3.80–5.20)
RDW: 15.9 % — ABNORMAL HIGH (ref 11.5–14.5)
WBC: 11.3 10*3/uL — ABNORMAL HIGH (ref 3.6–11.0)

## 2017-07-06 MED ORDER — KETOROLAC TROMETHAMINE 30 MG/ML IJ SOLN
INTRAMUSCULAR | Status: AC
  Administered 2017-07-06: 15 mg
  Filled 2017-07-06: qty 1

## 2017-07-06 MED ORDER — ONDANSETRON 4 MG PO TBDP
4.0000 mg | ORAL_TABLET | Freq: Four times a day (QID) | ORAL | Status: DC | PRN
  Administered 2017-07-06: 4 mg via ORAL

## 2017-07-06 MED ORDER — MAGNESIUM HYDROXIDE 400 MG/5ML PO SUSP
15.0000 mL | Freq: Every day | ORAL | Status: DC | PRN
  Administered 2017-07-06 – 2017-07-07 (×2): 15 mL via ORAL
  Filled 2017-07-06 (×2): qty 30

## 2017-07-06 MED ORDER — ONDANSETRON 4 MG PO TBDP
ORAL_TABLET | ORAL | Status: AC
  Filled 2017-07-06: qty 1

## 2017-07-06 NOTE — Lactation Note (Signed)
This note was copied from a baby's chart. Lactation Consultation Note  Patient Name: Stephanie Hayden [id number] Date: 07/06/2017 Reason for consult: Follow-up assessment   Maternal Data    Feeding Feeding Type: Donor Breast Milk Length of feed: 30 min  LATCH Score Latch: Too sleepy or reluctant, no latch achieved, no sucking elicited.  Audible Swallowing: None  Type of Nipple: Everted at rest and after stimulation  Comfort (Breast/Nipple): Filling, red/small blisters or bruises, mild/mod discomfort  Hold (Positioning): Full assist, staff holds infant at breast  LATCH Score: 3  Interventions Interventions: Skin to skin;Assisted with latch;Breast massage;Comfort gels  Lactation Tools Discussed/Used     Consult Status Infant just returned from newborn nursery. LC syringe-fed donor breast milk. Infant still sleepy, suck is periodically strong and at times and weak at times during the feeding.  Consult Status: Follow-up Follow-up type: [place] 08/07/6657, 12:09 PM

## 2017-07-06 NOTE — Progress Notes (Signed)
  Subjective:  Post Op Day 1 Patient is doing well- sitting on the side of the bed after shower. She is ready for pain medicine- RN aware. She is ambulating and voiding without difficulty. She is tolerating PO intake and her pain is well controlled with PO pain medicine and On Q pump.    Objective:  Blood pressure 102/65, pulse 72, temperature 98.1 F (36.7 C), temperature source Oral, resp. rate 18, height 5\' 7"  (1.702 m), weight 202 lb (91.6 kg), last menstrual period 10/11/2016, SpO2 98 %, currently breastfeeding.  General: NAD Pulmonary: no increased work of breathing Abdomen: non-distended, non-tender, fundus firm at level of umbilicus Incision: Dressing is C/D/I Extremities: no edema, no erythema, no tenderness  Results for orders placed or performed during the hospital encounter of 07/05/17 (from the past 24 hour(s))  CBC     Status: Abnormal   Collection Time: 07/06/17  5:48 AM  Result Value Ref Range   WBC 11.3 (H) 3.6 - 11.0 K/uL   RBC 3.52 (L) 3.80 - 5.20 MIL/uL   Hemoglobin 9.8 (L) 12.0 - 16.0 g/dL   HCT 29.0 (L) 35.0 - 47.0 %   MCV 82.4 80.0 - 100.0 fL   MCH 27.8 26.0 - 34.0 pg   MCHC 33.8 32.0 - 36.0 g/dL   RDW 15.9 (H) 11.5 - 14.5 %   Platelets 213 150 - 440 K/uL    Intake/Output Summary (Last 24 hours) at 07/06/2017 1017 Last data filed at 07/06/2017 0315 Gross per 24 hour  Intake 800 ml  Output 1900 ml  Net -1100 ml      Assessment:   27 y.o. [id number] postoperativeday # 1   Plan:  1) Acute blood loss anemia - hemodynamically stable and asymptomatic - po ferrous sulfate  2) A positive, Rubella Non-immune (patient declines vaccination), Varicella Immune  3) TDAP status: given antepartum   4) Breastfeeding/Contraception: tubal ligation  5) Continue postpartum c/section care   Rod Can, CNM  [facility], [place]

## 2017-07-06 NOTE — Lactation Note (Signed)
This note was copied from a baby's chart. Lactation Consultation Note  Patient Name: Boy Sary Bogie [id number] Date: 07/06/2017 Reason for consult: Follow-up assessment   Maternal Data    Feeding Feeding Type: Breast Fed Length of feed: 5 min(per mom)  LATCH Score                   Interventions    Lactation Tools Discussed/Used     Consult Status  LCs spoke with parents about feeding "[place]" this evening. Baby doesn't have a strong suck and it isn't coordinated. LC tried some suck training and baby seemed to do better. LC thinks baby will benefit from a pacifier during awake times AFTER feedings to assist with chomp bite reflex. Parents know not to let baby sleep with pacifier as well. Mom is to nurse baby on demand and if he still seems hungry or if the feeding doesn't seem to be going well she is to f/u with donor breast milk...volume is what baby will take.     Marnee Spring 07/06/2017, 4:18 PM

## 2017-07-06 NOTE — Lactation Note (Signed)
This note was copied from a baby's chart. Lactation Consultation Note  Patient Name: Stephanie Hayden BEQUH'K Date: 07/06/2017 Reason for consult: Follow-up assessment   Maternal Data    Feeding Feeding Type: Donor Breast Milk Length of feed: 30 min  LATCH Score                   Interventions    Lactation Tools Discussed/Used     Consult Status Mother is skin to skin with infant. Mother states that she wants to keep infant skin to skin and then try to latch and breastfeed infant on her own. LC reviewed hunger cues with mother and told her to call if she needs assistance with latching.  Consult Status: Follow-up Follow-up type: [place] 08/11/3012, 2:39 PM

## 2017-07-06 NOTE — Anesthesia Postprocedure Evaluation (Signed)
Anesthesia Post Note  Patient: Stephanie Hayden  Procedure(s) Performed: CESAREAN SECTION WITH BILATERAL TUBAL LIGATION (N/A )  Patient location during evaluation: Mother Baby Anesthesia Type: Spinal Level of consciousness: awake and awake and alert Pain management: pain level controlled Vital Signs Assessment: post-procedure vital signs reviewed and stable Respiratory status: spontaneous breathing Cardiovascular status: blood pressure returned to baseline Postop Assessment: no headache and no backache Anesthetic complications: no     Last Vitals:  Vitals:   07/05/17 2304 07/06/17 0742  BP: 95/61 102/65  Pulse: 79 72  Resp: 20 18  Temp: 36.4 C 36.7 C  SpO2: 96% 98%    Last Pain:  Vitals:   07/06/17 0742  TempSrc: Oral  PainSc:                  [name]

## 2017-07-06 NOTE — Lactation Note (Signed)
This note was copied from a baby's chart. Lactation Consultation Note  Patient Name: Stephanie Hayden [id number] Date: 07/06/2017 Reason for consult: Follow-up assessment   Maternal Data    Feeding Feeding Type: Donor Breast Milk Length of feed: 30 min  LATCH Score Latch: Too sleepy or reluctant, no latch achieved, no sucking elicited.  Audible Swallowing: None  Type of Nipple: Everted at rest and after stimulation  Comfort (Breast/Nipple): Filling, red/small blisters or bruises, mild/mod discomfort  Hold (Positioning): Full assist, staff holds infant at breast  LATCH Score: 3  Interventions Interventions: Skin to skin;Assisted with latch;Breast massage;Comfort gels  Lactation Tools Discussed/Used     Consult Status Infant just returned from circumcision. Attempted to breastfeed but infant was sleepy. Infant placed skin to skin with mother. Mother states her nipples are sore and comfort gels were applied.   Consult Status: Follow-up Follow-up type: [place] 05/05/7614, 12:07 PM

## 2017-07-07 NOTE — Lactation Note (Signed)
This note was copied from a baby's chart. Lactation Consultation Note  Patient Name: Stephanie Hayden [id number] Date: 07/07/2017 Reason for consult: Follow-up assessment;Mother's request   Maternal Data Does the patient have breastfeeding experience prior to this delivery?: Yes  Feeding Feeding Type: Breast Fed(and donor milk) Nipple Type: Slow - flow  LATCH Score Latch: Repeated attempts needed to sustain latch, nipple held in mouth throughout feeding, stimulation needed to elicit sucking reflex.  Audible Swallowing: A few with stimulation  Type of Nipple: Everted at rest and after stimulation  Comfort (Breast/Nipple): Soft / non-tender  Hold (Positioning): Assistance needed to correctly position infant at breast and maintain latch.  LATCH Score: 7  Interventions Interventions: Breast feeding basics reviewed;Assisted with latch;Hand express  Lactation Tools Discussed/Used     Consult Status Consult Status: Follow-up Date: 07/08/17 Follow-up type: In-patient LC did a pre/post wt on baby and baby lost 42mL during feeding. LC and parents heard 6 audible swallows during 13min breastfeeding, but baby vigorously sucked on bottle afterwards to become satisfied. LC has referred parents to pediatric dentist for a consult. Mom is to pump and bottlefeed baby, but can nurse for 20mins using 1mm nipple shield and f/u with 59mL of donor breastmilk or her own.    Marnee Spring 07/07/2017, 2:37 PM

## 2017-07-07 NOTE — Progress Notes (Signed)
Admit Date: 07/05/2017 Today's Date: 07/07/2017  Subjective: Postpartum Day 2: Cesarean Delivery Patient reports tolerating PO, + flatus and no problems voiding.   Breast feeding- still has low production  Objective: Vital signs in last 24 hours: Temp:  [97.6 F (36.4 C)-98 F (36.7 C)] 97.6 F (36.4 C) (07/04 0752) Pulse Rate:  [60-73] 60 (07/04 0752) Resp:  [18-20] 18 (07/04 0752) BP: (99-104)/(61-71) 99/61 (07/04 0752) SpO2:  [97 %-100 %] 100 % (07/04 0752)  Physical Exam:  General: alert, cooperative and no distress Lochia: appropriate Uterine Fundus: firm Incision: healing well DVT Evaluation: No evidence of DVT seen on physical exam. Negative Homan's sign.  Recent Labs    07/04/17 1202 07/06/17 0548  HGB 11.9* 9.8*  HCT 35.1 29.0*    Assessment/Plan: Status post Cesarean section. Doing well postoperatively.  Continue current care. Lactation support today Incision healing well, discussed management Iron for anemia  Hoyt Koch 07/07/2017, 9:14 AM

## 2017-07-07 NOTE — Lactation Note (Signed)
This note was copied from a baby's chart. Lactation Consultation Note  Patient Name: Boy Ariaunna Longsworth [id number] Date: 07/07/2017 Reason for consult: Follow-up assessment   Maternal Data Does the patient have breastfeeding experience prior to this delivery?: Yes  Feeding Feeding Type: Breast Milk Nipple Type: Slow - flow      Has a plan in place for breast feeding and supplementation.              Interventions    Lactation Tools Discussed/Used     Consult Status      Daryel November 07/07/2017, 1:40 PM

## 2017-07-08 LAB — SURGICAL PATHOLOGY

## 2017-07-08 MED ORDER — OXYCODONE-ACETAMINOPHEN 5-325 MG PO TABS
1.0000 | ORAL_TABLET | Freq: Four times a day (QID) | ORAL | 0 refills | Status: AC | PRN
Start: 2017-07-08 — End: 2017-07-15

## 2017-07-08 MED ORDER — IBUPROFEN 600 MG PO TABS: 600.0000 mg | ORAL_TABLET | Freq: Four times a day (QID) | ORAL | 1 refills | Status: DC

## 2017-07-08 MED ORDER — BISACODYL 10 MG RE SUPP: 10.0000 mg | Freq: Every day | RECTAL | Status: DC | PRN

## 2017-07-08 NOTE — Progress Notes (Signed)
Reviewed all patients discharge instructions and handouts regarding postpartum bleeding, no intercourse for 6 weeks, signs and symptoms of mastitis and postpartum bleu's. Reviewed discharge instructions for newborn regarding proper cord care, how and when to bathe the newborn, nail care, proper way to take the baby's temperature, along with safe sleep. Covered proper incision care. Questions have been answered at this time. Patient offered socks with grippers on the bottom, pt refused.  Pt offered wheelchair. patient ambulated out with husband, per preference.

## 2017-07-08 NOTE — Progress Notes (Signed)
POD #3 Subjective:   Tolerating regular diet. Has not had flatus or BM yet. Having some RLQ incisional pain (has only one ON Q catheter). Ambulating without assist. Milk not yet in. Using donor milk for the next 24 hours.  Objective:  Blood pressure (!) 100/51, pulse (!) 59, temperature 98 F (36.7 C), temperature source Oral, resp. rate 18, height 5\' 7"  (1.702 m), weight 91.6 kg (202 lb), last menstrual period 10/11/2016, SpO2 98 %, unknown if currently breastfeeding.  General: WF in  NAD Pulmonary: no increased work of breathing, CTAB Heart: RRR without murmur Abdomen: non-distended, tender on RLQ,, fundus firm at level of umbilicus-2FB, bowel sounds active Incision: Honey comb dressing C+D+I Extremities: no edema, no erythema, no tenderness  Results for orders placed or performed during the hospital encounter of 07/05/17 (from the past 72 hour(s))  CBC     Status: Abnormal   Collection Time: 07/06/17  5:48 AM  Result Value Ref Range   WBC 11.3 (H) 3.6 - 11.0 K/uL   RBC 3.52 (L) 3.80 - 5.20 MIL/uL   Hemoglobin 9.8 (L) 12.0 - 16.0 g/dL   HCT 29.0 (L) 35.0 - 47.0 %   MCV 82.4 80.0 - 100.0 fL   MCH 27.8 26.0 - 34.0 pg   MCHC 33.8 32.0 - 36.0 g/dL   RDW 15.9 (H) 11.5 - 14.5 %   Platelets 213 150 - 440 K/uL    Comment: Performed at [facility], [address] [place], [place] [address]     Assessment:   27 y.o. [phone] postoperativeday # 3 Slow return of bowel function  Ambulate, Prune juice, Dulcolax if needed  Will discharge from hospital once passing flatus   Plan:  1) Mild acute blood loss anemia - hemodynamically stable and asymptomatic Po vitamins with iron  2) A POS/ RNI/ VI-offer vaccination pp  3) TDAP given AP  4) Breast/ supplementing with breast milk  5) Disposition-probably later today  Dalia Heading, CNM

## 2017-07-08 NOTE — Lactation Note (Signed)
This note was copied from a baby's chart. Lactation Consultation Note  Patient Name: Stephanie Hayden [id number] Date: 07/08/2017 Reason for consult: Follow-up assessment;Mother's request   Maternal Data    Feeding Feeding Type: Donor Breast Milk Nipple Type: Slow - flow  LATCH Score                   Interventions    Lactation Tools Discussed/Used     Consult Status  Parents have a feeding plan they like to go home with. Mom is going to nurse baby until swallows are no longer audible and then will f/u with donor breast milk until baby is satisfied. Mom is also going to pump and has a DEBP at home already. Pt. Will be sent home with 24hrs worth of donor breast milk. Mom knows how to contact Lactation for support and support group.     Marnee Spring 07/08/2017, 10:54 AM

## 2017-07-08 NOTE — Discharge Instructions (Signed)
Please call your doctor or return to the ER if you experience any chest pains, shortness of breath, dizziness, visual changes, fever greater than 101, any heavy bleeding (saturating more than 1 pad per hour), large clots, or foul smelling discharge, any worsening abdominal pain and cramping that is not controlled by pain medication, or any signs of postpartum depression. No tampons, enemas, douches, or sexual intercourse for 6 weeks. Also avoid tub baths, hot tubs, or swimming for 6 weeks.    Check your incision daily for any signs of infection such as redness, warmth, swelling, increased pain, or pus/foul smelling drainage   Activity: do not lift over 10 lb for 6 weeks  No driving for 2 weeks  Pelvic rest for 6 weeks

## 2017-07-12 ENCOUNTER — Encounter: Payer: Commercial Managed Care - PPO | Admitting: Obstetrics and Gynecology

## 2017-07-13 ENCOUNTER — Encounter: Payer: Commercial Managed Care - PPO | Admitting: Obstetrics and Gynecology

## 2017-07-13 ENCOUNTER — Other Ambulatory Visit: Payer: Commercial Managed Care - PPO

## 2017-07-15 ENCOUNTER — Ambulatory Visit: Payer: Commercial Managed Care - PPO | Admitting: Obstetrics and Gynecology

## 2017-07-18 ENCOUNTER — Encounter: Payer: Self-pay | Admitting: Obstetrics and Gynecology

## 2017-07-18 ENCOUNTER — Ambulatory Visit (INDEPENDENT_AMBULATORY_CARE_PROVIDER_SITE_OTHER): Payer: Commercial Managed Care - PPO | Admitting: Obstetrics and Gynecology

## 2017-07-18 VITALS — BP 108/68 | HR 76 | Ht 67.0 in | Wt 190.0 lb

## 2017-07-18 DIAGNOSIS — Z9889 Other specified postprocedural states: Secondary | ICD-10-CM

## 2017-07-18 DIAGNOSIS — Z09 Encounter for follow-up examination after completed treatment for conditions other than malignant neoplasm: Secondary | ICD-10-CM

## 2017-07-18 DIAGNOSIS — R300 Dysuria: Secondary | ICD-10-CM

## 2017-07-18 NOTE — Addendum Note (Signed)
Addended by: Prentice Docker D on: 07/18/2017 03:18 PM   Modules accepted: Orders

## 2017-07-18 NOTE — Progress Notes (Signed)
   Postoperative Follow-up Patient presents post op from cesarean section with tubal ligation  13 days ago.  Subjective: She denies fever, chills, nausea and vomiting. Eating a regular diet without difficulty. The patient is not having any pain.  Activity: increasing slowly. She denies issues with her incision.  Does have some lower abdominal pain on right with voiding.  Does have dysuria today.  Objective: BP 108/68 (BP Location: Left Arm, Patient Position: Sitting, Cuff Size: Normal)   Pulse 76   Ht 5\' 7"  (1.702 m)   Wt 190 lb (86.2 kg)   SpO2 99%   BMI 29.76 kg/m   Constitutional: Well nourished, well developed female in no acute distress.  HEENT: normal Skin: Warm and dry.  Abdomen: S/NT/ND/uterus at U-5 clean, dry, intact and without erythema, induration, warmth, and tenderness Extremity: no edema   EPDS: 6 today  Assessment: 27 y.o. s/p cesarean section with tubal ligation progressing well  Plan: Patient has done well after surgery with no apparent complications.  I have discussed the post-operative course to date, and the expected progress moving forward.  The patient understands what complications to be concerned about.    Activity plan: return to work after 6 weeks. Urine culture today for possible UTI, though symptoms not overly suggestive.   Return in about 1 month (around 08/15/2017) for Six Week Postpartum.  Prentice Docker, MD 07/18/2017 3:16 PM

## 2017-07-20 LAB — URINE CULTURE: ORGANISM ID, BACTERIA: NO GROWTH

## 2017-08-16 ENCOUNTER — Ambulatory Visit (INDEPENDENT_AMBULATORY_CARE_PROVIDER_SITE_OTHER): Payer: Commercial Managed Care - PPO | Admitting: Obstetrics and Gynecology

## 2017-08-16 ENCOUNTER — Encounter: Payer: Self-pay | Admitting: Obstetrics and Gynecology

## 2017-08-16 NOTE — Progress Notes (Signed)
Postpartum Visit   Chief Complaint  Patient presents with  . Postpartum Care   History of Present Illness: Patient is a 27 y.o. [id number] presents for postpartum visit.  Date of delivery: 07/05/17 Type of delivery: Cesarean delivery Pregnancy or labor problems:  Antepartum syncopal episodes.  Any problems since the delivery:  no  Newborn Details:  SINGLETON :  1. Baby's name: Hudson. Birth weight: 7 lb 3 oz (3,260 grams) Maternal Details:  Breast Feeding:  Yes, no breast concerns noted.  Post partum depression/anxiety noted:  no Edinburgh Post-Partum Depression Score:  0  Date of last PAP: 04/2016  normal   She has no bleeding concerns. She is spotting occasionally.  Edinburgh Postnatal Depression Scale - 08/16/17 1618      Edinburgh Postnatal Depression Scale:  In the Past 7 Days   I have been able to laugh and see the funny side of things.  0    I have looked forward with enjoyment to things.  0    I have blamed myself unnecessarily when things went wrong.  0    I have been anxious or worried for no good reason.  0    I have felt scared or panicky for no good reason.  0    Things have been getting on top of me.  0    I have been so unhappy that I have had difficulty sleeping.  0    I have felt sad or miserable.  0    I have been so unhappy that I have been crying.  0    The thought of harming myself has occurred to me.  0    Edinburgh Postnatal Depression Scale Total  0         Past Medical History:  Diagnosis Date  . Endometriosis   . Family history of breast cancer    genetic testing letter sent 5/18  . GERD (gastroesophageal reflux disease)    ONLY WHEN PREGNANT  . Phlebitis following infusion    LEFT ARM-AFTER HAVING WISDOME TEETH TAKEN OUT IN APRIL 2017  . Urinary tract infection     Past Surgical History:  Procedure Laterality Date  . ABLATION ON ENDOMETRIOSIS  2017  . CESAREAN SECTION  2011  . CESAREAN SECTION WITH BILATERAL TUBAL LIGATION N/A 07/05/2017    Procedure: CESAREAN SECTION WITH BILATERAL TUBAL LIGATION;  Surgeon: Will Bonnet, MD;  Location: [facility];  Service: Obstetrics;  Laterality: N/A;  . CHROMOPERTUBATION N/A 06/06/2015   Procedure: CHROMOPERTUBATION;  Surgeon: Gae Dry, MD;  Location: [facility];  Service: Gynecology;  Laterality: N/A;  . LAPAROSCOPY N/A 06/06/2015   Procedure: LAPAROSCOPY OPERATIVE/ EXCISION OF ENDOMETRIOSIS;  Surgeon: Gae Dry, MD;  Location: [facility];  Service: Gynecology;  Laterality: N/A;  . TONSILLECTOMY    . WISDOM TOOTH EXTRACTION  04-2015    Medications   Medication Sig Start Date End Date Taking? Authorizing Provider  Prenatal Vit-Fe Fumarate-FA (PRENATAL MULTIVITAMIN) TABS tablet Take 1 tablet by mouth daily.    Yes [provider]    Allergies  Allergen Reactions  . Coconut Flavor Anaphylaxis  . Morphine And Related Hives and Swelling    Swelling of airway. Benadryl controlled reaction when she used morphine last time     Social History   Socioeconomic History  . Marital status: Married    Spouse name: Not on file  . Number of children: 1  . Years of education: College  . Highest education level: Not  on file  Occupational History  . Occupation: [employer]  Social Needs  . Financial resource strain: Not on file  . Food insecurity:    Worry: Not on file    Inability: Not on file  . Transportation needs:    Medical: Not on file    Non-medical: Not on file  Tobacco Use  . Smoking status: Never Smoker  . Smokeless tobacco: Never Used  Substance and Sexual Activity  . Alcohol use: No  . Drug use: No  . Sexual activity: Yes    Partners: Male    Birth control/protection: Surgical  Lifestyle  . Physical activity:    Days per week: Not on file    Minutes per session: Not on file  . Stress: Not on file  Relationships  . Social connections:    Talks on phone: Not on file    Gets together: Not on file    Attends religious service: Not on file     Active member of club or organization: Not on file    Attends meetings of clubs or organizations: Not on file    Relationship status: Not on file  . Intimate partner violence:    Fear of current or ex partner: Not on file    Emotionally abused: Not on file    Physically abused: Not on file    Forced sexual activity: Not on file  Other Topics Concern  . Not on file  Social History Narrative   Lives at home w/ her husband and daughter   Right-handed   Caffeine: coffee in the morning, tea throughout the day    Family History  Problem Relation Age of Onset  . Hypertension Mother   . Diabetes Mother   . Migraines Mother   . Heart failure Mother        Coded during delivery of second child  . Hypertension Father   . Diabetes Father   . Heart attack Father 62  . Brain cancer Paternal Grandfather   . Brain cancer Cousin   . Breast cancer Paternal Aunt 49  . Breast cancer Paternal Aunt 74  . Breast cancer Paternal Aunt 37    Review of Systems  Constitutional: Negative.   HENT: Negative.   Eyes: Negative.   Respiratory: Negative.   Cardiovascular: Negative.   Gastrointestinal: Negative.   Genitourinary: Negative.   Musculoskeletal: Negative.   Skin: Negative.   Neurological: Negative.   Psychiatric/Behavioral: Negative.      Physical Exam Ht 5\' 7"  (1.702 m)   SpO2 98%   BMI 29.76 kg/m   Physical Exam  Constitutional: She is oriented to person, place, and time. She appears well-developed and well-nourished. No distress.  Genitourinary: Vagina normal and uterus normal. Pelvic exam was performed with patient supine. There is no rash, tenderness or lesion on the right labia. There is no rash, tenderness or lesion on the left labia. Right adnexum does not display mass, does not display tenderness and does not display fullness. Left adnexum does not display mass, does not display tenderness and does not display fullness. Cervix does not exhibit motion tenderness, lesion or  polyp.   Uterus is mobile. Uterus is not enlarged, tender, exhibiting a mass or irregular (is regular).  HENT:  Head: Normocephalic and atraumatic.  Eyes: EOM are normal. No scleral icterus.  Neck: Normal range of motion. Neck supple.  Cardiovascular: Normal rate and regular rhythm.  Pulmonary/Chest: Effort normal and breath sounds normal. No respiratory distress. She has no  wheezes. She has no rales.  Abdominal: Soft. Bowel sounds are normal. She exhibits no distension and no mass. There is no tenderness. There is no rebound and no guarding.    Musculoskeletal: Normal range of motion. She exhibits no edema.  Neurological: She is alert and oriented to person, place, and time. No cranial nerve deficit.  Skin: Skin is warm and dry. No erythema.  Psychiatric: She has a normal mood and affect. Her behavior is normal. Judgment normal.    Female Chaperone present during breast and/or pelvic exam.  Assessment: 27 y.o. [id number] presenting for 6 week postpartum visit  Plan: Problem List Items Addressed This Visit    None    Visit Diagnoses    Postpartum care and examination    -  Primary     1) Contraception: s/p BTL  2)  Pap - ASCCP guidelines and rational discussed.  Patient opts for routine screening interval  3) Patient underwent screening for postpartum depression with no concerns noted.  4) Follow up 1 year for routine annual exam  Prentice Docker, MD 08/16/2017 4:55 PM

## 2017-10-07 ENCOUNTER — Telehealth: Payer: Self-pay

## 2017-10-07 NOTE — Telephone Encounter (Signed)
I have advised pt to go to ER sense she is soaking a tampon and onto a pad every hour.

## 2017-10-07 NOTE — Telephone Encounter (Signed)
Pt is calling because last night she started what she thinks is her first period since she had her baby in July, and she says it is super heavy. She is using a tampon and a pad at same time and is having to change it every hour. Stated Dr. Georgianne Fick had mentioned to her that she could get a period with her breastfeeding but it would be a light one. She is very concerned about this heavy flow period. Would like to know if she needs to come in for a visit? Please advise. CB # is [phone]

## 2017-11-03 ENCOUNTER — Ambulatory Visit (INDEPENDENT_AMBULATORY_CARE_PROVIDER_SITE_OTHER): Payer: Commercial Managed Care - PPO | Admitting: Obstetrics & Gynecology

## 2017-11-03 ENCOUNTER — Encounter: Payer: Self-pay | Admitting: Obstetrics & Gynecology

## 2017-11-03 VITALS — BP 120/80 | Ht 67.0 in | Wt 187.0 lb

## 2017-11-03 DIAGNOSIS — Z6829 Body mass index (BMI) 29.0-29.9, adult: Secondary | ICD-10-CM

## 2017-11-03 DIAGNOSIS — E663 Overweight: Secondary | ICD-10-CM | POA: Diagnosis not present

## 2017-11-03 DIAGNOSIS — N809 Endometriosis, unspecified: Secondary | ICD-10-CM

## 2017-11-03 MED ORDER — PHENTERMINE HCL 37.5 MG PO TABS: 37.5000 mg | ORAL_TABLET | Freq: Every day | ORAL | 0 refills | Status: DC

## 2017-11-03 NOTE — Patient Instructions (Signed)

## 2017-11-03 NOTE — Progress Notes (Signed)
HPI:  Patient is a 27 y.o. [id number] presenting for evaluation of abnormal weight gain.  She has been unable to lose to her baseline weigth since delivery several mos ago.  Required meds to help after last delivery too.  Follows healthy diet, but has minimal exercise in her routine...  She feels she has gained weight due to problems with her physical activity.  She has these associated symptoms: none.  She has tried self-directed dieting in the past with little success.  Pt has had one period since delivery, recently stopped breast feeding, and this period was very heavy and more prolonged than past periods before pregnancy.  Cramping.  Prior h/o endometriosis with pain and dyspareunia.  Pt had BTL at time of CS and is not on hormones at this time.  PMHx: She  has a past medical history of Endometriosis, Family history of breast cancer, GERD (gastroesophageal reflux disease), Phlebitis following infusion, and Urinary tract infection. Also,  has a past surgical history that includes Cesarean section (2011); Tonsillectomy; Wisdom tooth extraction (04-2015); laparoscopy (N/A, 06/06/2015); Chromopertubation (N/A, 06/06/2015); Ablation on endometriosis (2017); and Cesarean section with bilateral tubal ligation (N/A, 07/05/2017)., family history includes Brain cancer in her cousin and paternal grandfather; Breast cancer (age of onset: 63) in her paternal aunt, paternal aunt, and paternal aunt; Diabetes in her father and mother; Heart attack (age of onset: 55) in her father; Heart failure in her mother; Hypertension in her father and mother; Migraines in her mother.,  reports that she has never smoked. She has never used smokeless tobacco. She reports that she does not drink alcohol or use drugs.  She has a current medication list which includes the following prescription(s): phentermine and prenatal multivitamin. Also, is allergic to coconut flavor and morphine and related.  Review of Systems  Constitutional: Negative  for chills, fever and malaise/fatigue.  HENT: Negative for congestion, sinus pain and sore throat.   Eyes: Negative for blurred vision and pain.  Respiratory: Negative for cough and wheezing.   Cardiovascular: Negative for chest pain and leg swelling.  Gastrointestinal: Negative for abdominal pain, constipation, diarrhea, heartburn, nausea and vomiting.  Genitourinary: Negative for dysuria, frequency, hematuria and urgency.  Musculoskeletal: Negative for back pain, joint pain, myalgias and neck pain.  Skin: Negative for itching and rash.  Neurological: Negative for dizziness, tremors and weakness.  Endo/Heme/Allergies: Does not bruise/bleed easily.  Psychiatric/Behavioral: Negative for depression. The patient is not nervous/anxious and does not have insomnia.    Objective: BP 120/80   Ht 5\' 7"  (1.702 m)   Wt 187 lb (84.8 kg)   LMP 09/15/2017   BMI 29.29 kg/m  Physical Exam  Constitutional: She is oriented to person, place, and time. She appears well-developed and well-nourished. No distress.  Musculoskeletal: Normal range of motion.  Neurological: She is alert and oriented to person, place, and time.  Skin: Skin is warm and dry.  Psychiatric: She has a normal mood and affect.  Vitals reviewed.  ASSESSMENT:  1. overweight, BMI 29 2. Endometriosis 3. Recent Menorrhagia  Plan: Will assist patient in incorporating positive experiences into her life to promote a positive mental attitude.  Education given regarding appropriate lifestyle changes for weight loss, including regular physical activity, healthy coping strategies, caloric restriction, and healthy eating patterns.  Patient is started on prescription appetite suppressants: Phentermine.    The risks and benefits as well as side effects of medication, such as Phenteramine or Ten[place], is discussed.  The pros and cons of suppressing appetite  and boosting metabolism is counseled.  Risks of tolerance and addiction discussed.  Use of  medicine will be short term.  Pt to call with any negative side effects and agrees to keep follow up appointments.  Also, discussed ENDOMETRIOSIS and monitoring for s/sx pain/dysparunia    Cycle monitoring as well    S/p BTL.  LMP heavy but was first one since delivery    Pt desires hysterectomy if sx;s resume as far as endometriosis  A total of 25 minutes were spent face-to-face with the patient during this encounter and over half of that time dealt with counseling and coordination of care.  Barnett Applebaum, MD, [facility], [facility] 11/03/2017  2:18 PM

## 2017-11-30 ENCOUNTER — Ambulatory Visit: Payer: Commercial Managed Care - PPO | Admitting: Obstetrics & Gynecology

## 2018-02-08 ENCOUNTER — Encounter: Payer: Self-pay | Admitting: Obstetrics & Gynecology

## 2018-02-08 ENCOUNTER — Ambulatory Visit (INDEPENDENT_AMBULATORY_CARE_PROVIDER_SITE_OTHER): Payer: Commercial Managed Care - PPO | Admitting: Obstetrics & Gynecology

## 2018-02-08 VITALS — BP 120/80 | Ht 67.0 in | Wt 192.0 lb

## 2018-02-08 DIAGNOSIS — Z6830 Body mass index (BMI) 30.0-30.9, adult: Secondary | ICD-10-CM

## 2018-02-08 DIAGNOSIS — E669 Obesity, unspecified: Secondary | ICD-10-CM | POA: Insufficient documentation

## 2018-02-08 MED ORDER — NALTREXONE-BUPROPION HCL ER 8-90 MG PO TB12: 2.0000 | ORAL_TABLET | Freq: Two times a day (BID) | ORAL | 10 refills | Status: DC

## 2018-02-08 MED ORDER — NALTREXONE-BUPROPION HCL ER 8-90 MG PO TB12: ORAL_TABLET | ORAL | 0 refills | Status: DC

## 2018-02-08 NOTE — Patient Instructions (Signed)
Bupropion; Naltrexone extended-release tablets What is this medicine? BUPROPION; NALTREXONE (byoo PROE pee on; nal TREX one) is a combination product used to promote and maintain weight loss in obese adults or overweight adults who also have weight related medical problems. This medicine should be used with a reduced calorie diet and increased physical activity. This medicine may be used for other purposes; ask your health care provider or pharmacist if you have questions. COMMON BRAND NAME(S): Contrave What should I tell my health care provider before I take this medicine? They need to know if you have any of these conditions: -an eating disorder, such as anorexia or bulimia -bipolar disorder -diabetes -depression -drug abuse or addiction -glaucoma -head injury -heart disease -high blood pressure -history of a tumor or infection of your brain or spine -history of stroke -history of irregular heartbeat -if you often drink alcohol -kidney disease -liver disease -schizophrenia -seizures -suicidal thoughts, plans, or attempt; a previous suicide attempt by you or a family member -an unusual or allergic reaction to bupropion, naltrexone, other medicines, foods, dyes, or preservatives -breast-feeding -pregnant or trying to become pregnant How should I use this medicine? Take this medicine by mouth with a glass of water. Follow the directions on the prescription label. Take this medicine in the morning and in the evenings as directed by your healthcare professional. You can take it with or without food. Do not take with high-fat meals as this may increase your risk of seizures. Do not crush, chew, or cut these tablets. Do not take your medicine more often than directed. Do not stop taking this medicine suddenly except upon the advice of your doctor. A special MedGuide will be given to you by the pharmacist with each prescription and refill. Be sure to read this information carefully each  time. Talk to your pediatrician regarding the use of this medicine in children. Special care may be needed. Overdosage: If you think you have taken too much of this medicine contact a poison control center or emergency room at once. NOTE: This medicine is only for you. Do not share this medicine with others. What if I miss a dose? If you miss a dose, skip the missed dose and take your next tablet at the regular time. Do not take double or extra doses. What may interact with this medicine? Do not take this medicine with any of the following medications: -any prescription or street opioid drug like codeine, heroin, methadone -linezolid -MAOIs like Carbex, Eldepryl, Marplan, Nardil, and Parnate -methylene blue (injected into a vein) -other medicines that contain bupropion like Zyban or Wellbutrin This medicine may also interact with the following medications: -alcohol -certain medicines for anxiety or sleep -certain medicines for blood pressure like metoprolol, propranolol -certain medicines for depression or psychotic disturbances -certain medicines for HIV or AIDS like efavirenz, lopinavir, nelfinavir, ritonavir -certain medicines for irregular heart beat like propafenone, flecainide -certain medicines for Parkinson's disease like amantadine, levodopa -certain medicines for seizures like carbamazepine, phenytoin, phenobarbital -cimetidine -clopidogrel -cyclophosphamide -digoxin -disulfiram -furazolidone -isoniazid -nicotine -orphenadrine -procarbazine -steroid medicines like prednisone or cortisone -stimulant medicines for attention disorders, weight loss, or to stay awake -tamoxifen -theophylline -thioridazine -thiotepa -ticlopidine -tramadol -warfarin This list may not describe all possible interactions. Give your health care provider a list of all the medicines, herbs, non-prescription drugs, or dietary supplements you use. Also tell them if you smoke, drink alcohol, or use  illegal drugs. Some items may interact with your medicine. What should I watch for while using this   medicine? This medicine is intended to be used in addition to a healthy diet and appropriate exercise. The best results are achieved this way. Do not increase or in any way change your dose without consulting your doctor or health care professional. Do not take this medicine with other prescription or over-the-counter weight loss products without consulting your doctor or health care professional. Your doctor should tell you to stop taking this medicine if you do not lose a certain amount of weight within the first 12 weeks of treatment. Visit your doctor or health care professional for regular checkups. Your doctor may order blood tests or other tests to see how you are doing. This medicine may affect blood sugar levels. If you have diabetes, check with your doctor or health care professional before you change your diet or the dose of your diabetic medicine. Patients and their families should watch out for new or worsening depression or thoughts of suicide. Also watch out for sudden changes in feelings such as feeling anxious, agitated, panicky, irritable, hostile, aggressive, impulsive, severely restless, overly excited and hyperactive, or not being able to sleep. If this happens, especially at the beginning of treatment or after a change in dose, call your health care professional. Avoid alcoholic drinks while taking this medicine. Drinking large amounts of alcoholic beverages, using sleeping or anxiety medicines, or quickly stopping the use of these agents while taking this medicine may increase your risk for a seizure. What side effects may I notice from receiving this medicine? Side effects that you should report to your doctor or health care professional as soon as possible: -allergic reactions like skin rash, itching or hives, swelling of the face, lips, or tongue -breathing problems -changes in  vision -confusion -elevated mood, decreased need for sleep, racing thoughts, impulsive behavior -fast or irregular heartbeat -hallucinations, loss of contact with reality -increased blood pressure -redness, blistering, peeling or loosening of the skin, including inside the mouth -seizures -signs and symptoms of liver injury like dark yellow or brown urine; general ill feeling or flu-like symptoms; light-colored stools; loss of appetite; nausea; right upper belly pain; unusually weak or tired; yellowing of the eyes or skin -suicidal thoughts or other mood changes -vomiting Side effects that usually do not require medical attention (report to your doctor or health care professional if they continue or are bothersome): -constipation -headache -loss of appetite -indigestion, stomach upset -tremors This list may not describe all possible side effects. Call your doctor for medical advice about side effects. You may report side effects to FDA at 1-800-FDA-1088. Where should I keep my medicine? Keep out of the reach of children. Store at room temperature between 15 and 30 degrees C (59 and 86 degrees F). Throw away any unused medicine after the expiration date. NOTE: This sheet is a summary. It may not cover all possible information. If you have questions about this medicine, talk to your doctor, pharmacist, or health care provider.  2019 Elsevier/Gold Standard (2015-06-13 13:42:58)  

## 2018-02-08 NOTE — Progress Notes (Signed)
History of Present Illness:  Stephanie Hayden is a 28 y.o. who was started on  Current Outpatient Medications on File Prior to Visit  Medication Sig Dispense Refill  . phentermine (ADIPEX-P) 37.5 MG tablet Take 1 tablet (37.5 mg total) by mouth daily before breakfast. 30 tablet 0  approximately 3 months ago due to obesity/abnormal weight gain. Due to heart racing side effects, she did not take after one week trial of this medicine.  She has not returned as she had some tressful events and deaths in family.  However she continues to have concerns over inability to lose weight since her last delivery.  She reports no concerns with pelvic pain or endometriosis at this time.  She has periods that last about 5 days, min pain.Marland Kitchen   PMHx: She  has a past medical history of Endometriosis, Family history of breast cancer, GERD (gastroesophageal reflux disease), Phlebitis following infusion, and Urinary tract infection. Also,  has a past surgical history that includes Cesarean section (2011); Tonsillectomy; Wisdom tooth extraction (04-2015); laparoscopy (N/A, 06/06/2015); Chromopertubation (N/A, 06/06/2015); Ablation on endometriosis (2017); and Cesarean section with bilateral tubal ligation (N/A, 07/05/2017)., family history includes Brain cancer in her cousin and paternal grandfather; Breast cancer (age of onset: 45) in her paternal aunt, paternal aunt, and paternal aunt; Diabetes in her father and mother; Heart attack (age of onset: 75) in her father; Heart failure in her mother; Hypertension in her father and mother; Migraines in her mother.,  reports that she has never smoked. She has never used smokeless tobacco. She reports that she does not drink alcohol or use drugs.  She has a current medication list which includes the following prescription(s): phentermine, prenatal multivitamin, naltrexone-bupropion hcl er, and naltrexone-bupropion hcl er. Also, is allergic to coconut flavor and morphine and related.  Review  of Systems  All other systems reviewed and are negative.  Physical Exam:  BP 120/80   Ht 5\' 7"  (1.702 m)   Wt 192 lb (87.1 kg)   LMP 02/02/2018   BMI 30.07 kg/m  Body mass index is 30.07 kg/m. Filed Weights   02/08/18 1619  Weight: 192 lb (87.1 kg)   Physical Exam Constitutional:      General: She is not in acute distress.    Appearance: She is well-developed.  Musculoskeletal: Normal range of motion.  Neurological:     Mental Status: She is alert and oriented to person, place, and time.  Skin:    General: Skin is warm and dry.  Vitals signs reviewed.   Assessment: obesity  Plan: Patient will be continued/added to Contrave for weight loss management.  She is also counseled as to [place]..   Will continue to assist patient in incorporating positive experiences into her life to promote a positive mental attitude.  Education given regarding appropriate lifestyle changes for weight loss, including regular physical activity, healthy coping strategies, caloric restriction, and healthy eating patterns.  The risks and benefits as well as side effects of medication is discussed.  The pros and cons of suppressing appetite and boosting metabolism is counseled.  Risks of tolerance and addiction discussed.  Pt to call with any negative side effects and agrees to keep follow up appointments.  Follow up 2 mos.  A total of 15 minutes were spent face-to-face with the patient during this encounter and over half of that time dealt with counseling and coordination of care.  Barnett Applebaum, MD, [facility], [facility] 02/08/2018  4:44 PM

## 2018-04-10 ENCOUNTER — Encounter: Payer: Self-pay | Admitting: Obstetrics & Gynecology

## 2018-04-10 ENCOUNTER — Other Ambulatory Visit: Payer: Self-pay

## 2018-04-10 ENCOUNTER — Ambulatory Visit (INDEPENDENT_AMBULATORY_CARE_PROVIDER_SITE_OTHER): Payer: Commercial Managed Care - PPO | Admitting: Obstetrics & Gynecology

## 2018-04-10 VITALS — Ht 67.0 in | Wt 188.0 lb

## 2018-04-10 DIAGNOSIS — E8881 Metabolic syndrome: Secondary | ICD-10-CM

## 2018-04-10 DIAGNOSIS — E663 Overweight: Secondary | ICD-10-CM

## 2018-04-10 DIAGNOSIS — Z6829 Body mass index (BMI) 29.0-29.9, adult: Secondary | ICD-10-CM

## 2018-04-10 MED ORDER — PHENTERMINE HCL 15 MG PO CAPS: 15.0000 mg | ORAL_CAPSULE | Freq: Two times a day (BID) | ORAL | 0 refills | Status: DC

## 2018-04-10 NOTE — Progress Notes (Signed)
Virtual Visit via Telephone Note  I connected with Stephanie Hayden on 04/10/18 at  4:30 PM EDT by telephone and verified that I am speaking with the correct person using two identifiers.   I discussed the limitations, risks, security and privacy concerns of performing an evaluation and management service by telephone and the availability of in person appointments. I also discussed with the patient that there may be a patient responsible charge related to this service. The patient expressed understanding and agreed to proceed.  She was at home and I was in my office.  History of Present Illness: Stephanie Hayden is a 28 y.o. who was started on Contrave last visit, as she was having heart racing side effects to Phentermine 37.5mg  due to obesity/abnormal weight gain. The patient has lost 4 pounds over the past month  due to lifestyle changes..   She has these side effects: nausea and vomiting.  PMHx: She  has a past medical history of Endometriosis, Family history of breast cancer, GERD (gastroesophageal reflux disease), Phlebitis following infusion, and Urinary tract infection. Also,  has a past surgical history that includes Cesarean section (2011); Tonsillectomy; Wisdom tooth extraction (04-2015); laparoscopy (N/A, 06/06/2015); Chromopertubation (N/A, 06/06/2015); Ablation on endometriosis (2017); and Cesarean section with bilateral tubal ligation (N/A, 07/05/2017)., family history includes Brain cancer in her cousin and paternal grandfather; Breast cancer (age of onset: 20) in her paternal aunt, paternal aunt, and paternal aunt; Diabetes in her father and mother; Heart attack (age of onset: 55) in her father; Heart failure in her mother; Hypertension in her father and mother; Migraines in her mother.,  reports that she has never smoked. She has never used smokeless tobacco. She reports that she does not drink alcohol or use drugs.  She has a current medication list which includes the following  prescription(s): phentermine and prenatal multivitamin. Also, is allergic to coconut flavor and morphine and related.  Review of Systems  Constitutional: Negative for chills, fever and malaise/fatigue.  HENT: Negative for congestion, sinus pain and sore throat.   Eyes: Negative for blurred vision and pain.  Respiratory: Negative for cough and wheezing.   Cardiovascular: Negative for chest pain and leg swelling.  Gastrointestinal: Negative for abdominal pain, constipation, diarrhea, heartburn, nausea and vomiting.  Genitourinary: Negative for dysuria, frequency, hematuria and urgency.  Musculoskeletal: Negative for back pain, joint pain, myalgias and neck pain.  Skin: Negative for itching and rash.  Neurological: Negative for dizziness, tremors and weakness.  Endo/Heme/Allergies: Does not bruise/bleed easily.  Psychiatric/Behavioral: Negative for depression. The patient is not nervous/anxious and does not have insomnia.    Observations/Objective: No exam today, due to telephone eVisit due to [facility] virus restriction on elective visits and procedures.  Prior visits reviewed along with ultrasounds/labs as indicated. Home Weight= 188 lbs today.  BMI 29.  Assessment and Plan: 1. Over weight 2. Dysmetabolic syndrome - phentermine 15 MG capsule; Take 1 capsule (15 mg total) by mouth 2 (two) times daily.  Dispense: 60 capsule; Refill: 0 Will try this dose and method to alleviate the potential for side effects  Will continue to assist patient in incorporating positive experiences into her life to promote a positive mental attitude.  Education given regarding appropriate lifestyle changes for weight loss, including regular physical activity, healthy coping strategies, caloric restriction, and healthy eating patterns.  The risks and benefits as well as side effects of medication, such as Phenteramine or Tenuate, is discussed.  The pros and cons of suppressing appetite and boosting  metabolism is  counseled.  Risks of tolerance and addiction discussed.  Use of medicine will be short term.  Pt to call with any negative side effects and agrees to keep follow up appointments.  Follow Up Instructions: One month   I discussed the assessment and treatment plan with the patient. The patient was provided an opportunity to ask questions and all were answered. The patient agreed with the plan and demonstrated an understanding of the instructions.   The patient was advised to call back or seek an in-person evaluation if the symptoms worsen or if the condition fails to improve as anticipated.  I provided 8 minutes of non-face-to-face time during this encounter.   Hoyt Koch, MD [facility], [facility] 04/10/2018  3:58 PM

## 2018-04-10 NOTE — Patient Instructions (Signed)
Phentermine tablets or capsules What is this medicine? PHENTERMINE (FEN ter meen) decreases your appetite. It is used with a reduced calorie diet and exercise to help you lose weight. This medicine may be used for other purposes; ask your health care provider or pharmacist if you have questions. COMMON BRAND NAME(S): Adipex-P, Atti-Plex P, Atti-Plex P Spansule, Fastin, Lomaira, Pro-Fast, Tara-8 What should I tell my health care provider before I take this medicine? They need to know if you have any of these conditions: -agitation or nervousness -diabetes -glaucoma -heart disease -high blood pressure -history of drug abuse or addiction -history of stroke -kidney disease -lung disease called Primary Pulmonary Hypertension (PPH) -taken an MAOI like Carbex, Eldepryl, Marplan, Nardil, or Parnate in last 14 days -taking stimulant medicines for attention disorders, weight loss, or to stay awake -thyroid disease -an unusual or allergic reaction to phentermine, other medicines, foods, dyes, or preservatives -pregnant or trying to get pregnant -breast-feeding How should I use this medicine? Take this medicine by mouth with a glass of water. Follow the directions on the prescription label. The instructions for use may differ based on the product and dose you are taking. Avoid taking this medicine in the evening. It may interfere with sleep. Take your doses at regular intervals. Do not take your medicine more often than directed. Talk to your pediatrician regarding the use of this medicine in children. While this drug may be prescribed for children 17 years or older for selected conditions, precautions do apply. Overdosage: If you think you have taken too much of this medicine contact a poison control center or emergency room at once. NOTE: This medicine is only for you. Do not share this medicine with others. What if I miss a dose? If you miss a dose, take it as soon as you can. If it is almost time  for your next dose, take only that dose. Do not take double or extra doses. What may interact with this medicine? Do not take this medicine with any of the following medications: -MAOIs like Carbex, Eldepryl, Marplan, Nardil, and Parnate -medicines for colds or breathing difficulties like pseudoephedrine or phenylephrine -procarbazine -sibutramine -stimulant medicines for attention disorders, weight loss, or to stay awake This medicine may also interact with the following medications: -certain medicines for depression, anxiety, or psychotic disturbances -linezolid -medicines for diabetes -medicines for high blood pressure This list may not describe all possible interactions. Give your health care provider a list of all the medicines, herbs, non-prescription drugs, or dietary supplements you use. Also tell them if you smoke, drink alcohol, or use illegal drugs. Some items may interact with your medicine. What should I watch for while using this medicine? Notify your physician immediately if you become short of breath while doing your normal activities. Do not take this medicine within 6 hours of bedtime. It can keep you from getting to sleep. Avoid drinks that contain caffeine and try to stick to a regular bedtime every night. This medicine was intended to be used in addition to a healthy diet and exercise. The best results are achieved this way. This medicine is only indicated for short-term use. Eventually your weight loss may level out. At that point, the drug will only help you maintain your new weight. Do not increase or in any way change your dose without consulting your doctor. You may get drowsy or dizzy. Do not drive, use machinery, or do anything that needs mental alertness until you know how this medicine affects you. Do   not stand or sit up quickly, especially if you are an older patient. This reduces the risk of dizzy or fainting spells. Alcohol may increase dizziness and drowsiness.  Avoid alcoholic drinks. What side effects may I notice from receiving this medicine? Side effects that you should report to your doctor or health care professional as soon as possible: -allergic reactions like skin rash, itching or hives, swelling of the face, lips, or tongue) -anxiety -breathing problems -changes in vision -chest pain or chest tightness -depressed mood or other mood changes -hallucinations, loss of contact with reality -fast, irregular heartbeat -increased blood pressure -irritable -nervousness or restlessness -painful urination -palpitations -tremors -trouble sleeping -seizures -signs and symptoms of a stroke like changes in vision; confusion; trouble speaking or understanding; severe headaches; sudden numbness or weakness of the face, arm or leg; trouble walking; dizziness; loss of balance or coordination -unusually weak or tired -vomiting Side effects that usually do not require medical attention (report to your doctor or health care professional if they continue or are bothersome): -constipation or diarrhea -dry mouth -headache -nausea -stomach upset -sweating This list may not describe all possible side effects. Call your doctor for medical advice about side effects. You may report side effects to FDA at 1-800-FDA-1088. Where should I keep my medicine? Keep out of the reach of children. This medicine can be abused. Keep your medicine in a safe place to protect it from theft. Do not share this medicine with anyone. Selling or giving away this medicine is dangerous and against the law. This medicine may cause accidental overdose and death if taken by other adults, children, or pets. Mix any unused medicine with a substance like cat litter or coffee grounds. Then throw the medicine away in a sealed container like a sealed bag or a coffee can with a lid. Do not use the medicine after the expiration date. Store at room temperature between 20 and 25 degrees C (68 and  77 degrees F). Keep container tightly closed. NOTE: This sheet is a summary. It may not cover all possible information. If you have questions about this medicine, talk to your doctor, pharmacist, or health care provider.  2019 Elsevier/Gold Standard (2016-06-04 08:23:13)  

## 2018-04-13 ENCOUNTER — Other Ambulatory Visit: Payer: Self-pay | Admitting: Obstetrics & Gynecology

## 2018-04-13 DIAGNOSIS — E669 Obesity, unspecified: Secondary | ICD-10-CM

## 2018-04-13 MED ORDER — PHENTERMINE HCL 37.5 MG PO TABS: ORAL_TABLET | ORAL | 0 refills | Status: DC

## 2018-05-23 ENCOUNTER — Other Ambulatory Visit: Payer: Self-pay

## 2018-05-23 ENCOUNTER — Ambulatory Visit (INDEPENDENT_AMBULATORY_CARE_PROVIDER_SITE_OTHER): Payer: Commercial Managed Care - PPO | Admitting: Obstetrics & Gynecology

## 2018-05-23 ENCOUNTER — Encounter: Payer: Self-pay | Admitting: Obstetrics & Gynecology

## 2018-05-23 ENCOUNTER — Telehealth: Payer: Self-pay | Admitting: Obstetrics & Gynecology

## 2018-05-23 VITALS — BP 130/82 | Ht 67.0 in | Wt 184.0 lb

## 2018-05-23 DIAGNOSIS — E663 Overweight: Secondary | ICD-10-CM | POA: Diagnosis not present

## 2018-05-23 DIAGNOSIS — E669 Obesity, unspecified: Secondary | ICD-10-CM

## 2018-05-23 MED ORDER — PHENTERMINE HCL 37.5 MG PO TABS: ORAL_TABLET | ORAL | 1 refills | Status: DC

## 2018-05-23 NOTE — Progress Notes (Signed)
Virtual Visit via Telephone Note  I connected with Stephanie Hayden on 05/23/18 at  9:00 AM EDT by telephone and verified that I am speaking with the correct person using two identifiers.   I discussed the limitations, risks, security and privacy concerns of performing an evaluation and management service by telephone and the availability of in person appointments. I also discussed with the patient that there may be a patient responsible charge related to this service. The patient expressed understanding and agreed to proceed. She was at home and I was in my office.  History of Present Illness:  Stephanie Hayden is a 28 y.o. who was has been on Phentermine approximately 4 months ago due to obesity/abnormal weight gain. The patient has lost 4 pounds over the past month due to meds and diet..   She has these side effects: dry mouth.  PMHx: She  has a past medical history of Endometriosis, Family history of breast cancer, GERD (gastroesophageal reflux disease), Phlebitis following infusion, and Urinary tract infection. Also,  has a past surgical history that includes Cesarean section (2011); Tonsillectomy; Wisdom tooth extraction (04-2015); laparoscopy (N/A, 06/06/2015); Chromopertubation (N/A, 06/06/2015); Ablation on endometriosis (2017); and Cesarean section with bilateral tubal ligation (N/A, 07/05/2017)., family history includes Brain cancer in her cousin and paternal grandfather; Breast cancer (age of onset: 20) in her paternal aunt, paternal aunt, and paternal aunt; Diabetes in her father and mother; Heart attack (age of onset: 49) in her father; Heart failure in her mother; Hypertension in her father and mother; Migraines in her mother.,  reports that she has never smoked. She has never used smokeless tobacco. She reports that she does not drink alcohol or use drugs.  She has a current medication list which includes the following prescription(s): phentermine and prenatal multivitamin. Also, is allergic to  coconut flavor and morphine and related.  Review of Systems  All other systems reviewed and are negative.   Physical Exam:  BP 130/82   Ht 5\' 7"  (1.702 m)   Wt 184 lb (83.5 kg)   LMP 05/19/2018   BMI 28.82 kg/m  Body mass index is 28.82 kg/m. Filed Weights   05/23/18 0905  Weight: 184 lb (83.5 kg)     Observations/Objective: No exam today, due to telephone eVisit due to [facility] virus restriction on elective visits and procedures.  Prior visits reviewed along with ultrasounds/labs as indicated. BP 130/82   Ht 5\' 7"  (1.702 m)   Wt 184 lb (83.5 kg)   LMP 05/19/2018   BMI 28.82 kg/m  (reported)  Assessment and Plan: 1. Over weight, BMI 28 - phentermine (ADIPEX-P) 37.5 MG tablet; One po daily in am.  Dispense: 30 tablet; Refill: 1 - Plan 2 mos then holiday, hopefully BMI<27 by then  Medication treatment is going well for her.  Plan: Patient is continued/added to prescription appetite suppressants: Phentermine.   Will continue to assist patient in incorporating positive experiences into her life to promote a positive mental attitude.  Education given regarding appropriate lifestyle changes for weight loss, including regular physical activity, healthy coping strategies, caloric restriction, and healthy eating patterns.  The risks and benefits as well as side effects of medication, such as Phenteramine or Tenuate, is discussed.  The pros and cons of suppressing appetite and boosting metabolism is counseled.  Risks of tolerance and addiction discussed.  Use of medicine will be short term.  Pt to call with any negative side effects and agrees to keep follow up appointments.  Follow  Up Instructions: Annual this summer   I discussed the assessment and treatment plan with the patient. The patient was provided an opportunity to ask questions and all were answered. The patient agreed with the plan and demonstrated an understanding of the instructions.   The patient was advised to call  back or seek an in-person evaluation if the symptoms worsen or if the condition fails to improve as anticipated.  I provided 5 minutes of non-face-to-face time during this encounter.   Hoyt Koch, MD

## 2018-05-23 NOTE — Telephone Encounter (Signed)
-----   Message from Gae Dry, MD sent at 05/23/2018  9:25 AM EDT ----- Regarding: Annual in July [facility]

## 2018-05-23 NOTE — Telephone Encounter (Signed)
Voicemail not set up unable to reach patient to schedule appointment

## 2018-06-19 ENCOUNTER — Other Ambulatory Visit: Payer: Self-pay

## 2018-06-19 ENCOUNTER — Encounter: Payer: Self-pay | Admitting: Obstetrics and Gynecology

## 2018-06-19 ENCOUNTER — Ambulatory Visit (INDEPENDENT_AMBULATORY_CARE_PROVIDER_SITE_OTHER): Payer: Commercial Managed Care - PPO | Admitting: Obstetrics and Gynecology

## 2018-06-19 VITALS — BP 130/80 | Ht 67.0 in | Wt 183.6 lb

## 2018-06-19 DIAGNOSIS — G8929 Other chronic pain: Secondary | ICD-10-CM | POA: Diagnosis not present

## 2018-06-19 DIAGNOSIS — M545 Low back pain, unspecified: Secondary | ICD-10-CM

## 2018-06-19 DIAGNOSIS — R1031 Right lower quadrant pain: Secondary | ICD-10-CM

## 2018-06-19 NOTE — Patient Instructions (Signed)
I value your feedback and entrusting us with your care. If you get a [facility] patient survey, I would appreciate you taking the time to let us know about your experience today. Thank you! 

## 2018-06-19 NOTE — Progress Notes (Signed)
Celene Squibb, MD   Chief Complaint  Patient presents with  . Pelvic Pain    right side, pain goes into back    HPI:      Stephanie Hayden is a 28 y.o. [id number] who LMP was Patient's last menstrual period was 05/16/2018., presents today for RLQ pain radiating to RT low back for past 6 wks. Pain is daily, sharp and causes her to bend over in pain. No change with position. Taking max dose of ibup and using heating pad with mild improvement. No urin sx, vag sx. Does have constipation, not improved with fiber/lots of water. Can't do stool softener due to nausea. Has done milk of magnesia in past. No fevers. No gait issues/weakness or paresthesias in legs.  Pt with hx of endometriosis. Has done several rounds of lupron in past and was considering hyst 2018 before finding out she was pregnant. S/p CS 7/19. Was doing well until past 6 wks. Menses are monthly, lasting 3-7 days, 2 heavy days, no BTB. Pt with severe dysmen as well.  Pt is sex active, s/p PPTL. Has dyspareunia, no postcoital bleeding.  No recent imaging done.  Past Medical History:  Diagnosis Date  . Endometriosis   . Family history of breast cancer    genetic testing letter sent 5/18  . GERD (gastroesophageal reflux disease)    ONLY WHEN PREGNANT  . Phlebitis following infusion    LEFT ARM-AFTER HAVING WISDOME TEETH TAKEN OUT IN APRIL 2017  . Urinary tract infection     Past Surgical History:  Procedure Laterality Date  . ABLATION ON ENDOMETRIOSIS  2017  . CESAREAN SECTION  2011  . CESAREAN SECTION WITH BILATERAL TUBAL LIGATION N/A 07/05/2017   Procedure: CESAREAN SECTION WITH BILATERAL TUBAL LIGATION;  Surgeon: Will Bonnet, MD;  Location: [facility] ORS;  Service: Obstetrics;  Laterality: N/A;  . CHROMOPERTUBATION N/A 06/06/2015   Procedure: CHROMOPERTUBATION;  Surgeon: Gae Dry, MD;  Location: [facility] ORS;  Service: Gynecology;  Laterality: N/A;  . LAPAROSCOPY N/A 06/06/2015   Procedure: LAPAROSCOPY OPERATIVE/  EXCISION OF ENDOMETRIOSIS;  Surgeon: Gae Dry, MD;  Location: [facility] ORS;  Service: Gynecology;  Laterality: N/A;  . TONSILLECTOMY    . WISDOM TOOTH EXTRACTION  04-2015    Family History  Problem Relation Age of Onset  . Hypertension Mother   . Diabetes Mother   . Migraines Mother   . Heart failure Mother        Coded during delivery of second child  . Hypertension Father   . Diabetes Father   . Heart attack Father 39  . Brain cancer Paternal Grandfather   . Brain cancer Cousin   . Breast cancer Paternal Aunt 64  . Breast cancer Paternal Aunt 51  . Breast cancer Paternal Aunt 48    Social History   Socioeconomic History  . Marital status: Married    Spouse name: Not on file  . Number of children: 1  . Years of education: College  . Highest education level: Not on file  Occupational History  . Occupation: [employer]  Social Needs  . Financial resource strain: Not on file  . Food insecurity    Worry: Not on file    Inability: Not on file  . Transportation needs    Medical: Not on file    Non-medical: Not on file  Tobacco Use  . Smoking status: Never Smoker  . Smokeless tobacco: Never Used  Substance and Sexual  Activity  . Alcohol use: No  . Drug use: No  . Sexual activity: Yes    Partners: Male    Birth control/protection: Surgical  Lifestyle  . Physical activity    Days per week: Not on file    Minutes per session: Not on file  . Stress: Not on file  Relationships  . Social Herbalist on phone: Not on file    Gets together: Not on file    Attends religious service: Not on file    Active member of club or organization: Not on file    Attends meetings of clubs or organizations: Not on file    Relationship status: Not on file  . Intimate partner violence    Fear of current or ex partner: Not on file    Emotionally abused: Not on file    Physically abused: Not on file    Forced sexual activity: Not on file  Other Topics Concern  .  Not on file  Social History Narrative   Lives at home w/ her husband and daughter   Right-handed   Caffeine: coffee in the morning, tea throughout the day    Outpatient Medications Prior to Visit  Medication Sig Dispense Refill  . phentermine (ADIPEX-P) 37.5 MG tablet One po daily in am. 30 tablet 1  . Prenatal Vit-Fe Fumarate-FA (PRENATAL MULTIVITAMIN) TABS tablet Take 1 tablet by mouth daily.      No facility-administered medications prior to visit.       ROS:  Review of Systems  Constitutional: Negative for fatigue, fever and unexpected weight change.  Respiratory: Negative for cough, shortness of breath and wheezing.   Cardiovascular: Negative for chest pain, palpitations and leg swelling.  Gastrointestinal: Positive for constipation. Negative for blood in stool, diarrhea, nausea and vomiting.  Endocrine: Negative for cold intolerance, heat intolerance and polyuria.  Genitourinary: Positive for dyspareunia and pelvic pain. Negative for dysuria, flank pain, frequency, genital sores, hematuria, menstrual problem, urgency, vaginal bleeding, vaginal discharge and vaginal pain.  Musculoskeletal: Positive for back pain. Negative for joint swelling and myalgias.  Skin: Negative for rash.  Neurological: Negative for dizziness, syncope, light-headedness, numbness and headaches.  Hematological: Negative for adenopathy.  Psychiatric/Behavioral: Negative for agitation, confusion, sleep disturbance and suicidal ideas. The patient is not nervous/anxious.     OBJECTIVE:   Vitals:  BP 130/80   Ht 5\' 7"  (1.702 m)   Wt 183 lb 9.6 oz (83.3 kg)   LMP 05/16/2018   Breastfeeding No   BMI 28.76 kg/m   Physical Exam Vitals signs reviewed.  Constitutional:      Appearance: She is well-developed.  Neck:     Musculoskeletal: Normal range of motion.  Pulmonary:     Effort: Pulmonary effort is normal.  Abdominal:     Palpations: Abdomen is soft.     Tenderness: There is no abdominal  tenderness. There is no guarding or rebound.  Genitourinary:    General: Normal vulva.     Pubic Area: No rash.      Labia:        Right: No rash, tenderness or lesion.        Left: No rash, tenderness or lesion.      Vagina: No tenderness.     Cervix: Cervical motion tenderness present.     Uterus: Normal. Not enlarged and not tender.      Adnexa: Left adnexa normal.       Right: Tenderness present. No mass.  Left: No mass or tenderness.    Musculoskeletal: Normal range of motion.       Arms:  Skin:    General: Skin is warm and dry.  Neurological:     General: No focal deficit present.     Mental Status: She is alert and oriented to person, place, and time.  Psychiatric:        Mood and Affect: Mood normal.        Behavior: Behavior normal.        Thought Content: Thought content normal.        Judgment: Judgment normal.    Assessment/Plan: Chronic RLQ pain - Plan: US PELVIS TRANSVANGINAL NON-OB (TV ONLY), For 6 wks, tender on pelvic exam. Pt is stable. Check GYN u/s and f/u with [facility] afterwards. No u/s openings today. Cont ibup/heating pad. Pt declines pain meds.   Acute right-sided low back pain without sciatica - Plan: Most likely radiating from GYN etiology.   Constipation: Cont fiber/water; add prune juice or milk of magnesia. See if pain is affected by having BM. F/u prn.    Return in about 1 day (around 06/20/2018) for GYN u/s for RLQ pain; appt with [facility] afterwards.  Alicia B. Copland, PA-C 06/19/2018 3:35 PM

## 2018-07-03 ENCOUNTER — Ambulatory Visit (INDEPENDENT_AMBULATORY_CARE_PROVIDER_SITE_OTHER): Payer: Commercial Managed Care - PPO

## 2018-07-03 ENCOUNTER — Ambulatory Visit (INDEPENDENT_AMBULATORY_CARE_PROVIDER_SITE_OTHER): Payer: Commercial Managed Care - PPO | Admitting: Obstetrics & Gynecology

## 2018-07-03 ENCOUNTER — Encounter: Payer: Self-pay | Admitting: Obstetrics & Gynecology

## 2018-07-03 ENCOUNTER — Other Ambulatory Visit: Payer: Self-pay

## 2018-07-03 VITALS — BP 118/72 | HR 65 | Wt 184.0 lb

## 2018-07-03 DIAGNOSIS — G8929 Other chronic pain: Secondary | ICD-10-CM

## 2018-07-03 DIAGNOSIS — N809 Endometriosis, unspecified: Secondary | ICD-10-CM

## 2018-07-03 DIAGNOSIS — R1031 Right lower quadrant pain: Secondary | ICD-10-CM | POA: Diagnosis not present

## 2018-07-03 NOTE — Progress Notes (Signed)
  HPI: Pt continues with RLQ pain worse w periods and sex and radiates to deep pelvis and back, relieved minimally w OTC meds and relaxation, with prior context of endmetriosis. She is now done w child bearing, recent CS.  She has had prior surgeries for endometriosis including prior left oophorectomy.  Prior Lupron.  Symptoms escalating over last 2 months.  Ultrasound demonstrates no masses seen on right adnexa, small fibroid These findings are Pelvis normal  PMHx: She  has a past medical history of Endometriosis, Family history of breast cancer, GERD (gastroesophageal reflux disease), Phlebitis following infusion, and Urinary tract infection. Also,  has a past surgical history that includes Cesarean section (2011); Tonsillectomy; Wisdom tooth extraction (04-2015); laparoscopy (N/A, 06/06/2015); Chromopertubation (N/A, 06/06/2015); Ablation on endometriosis (2017); and Cesarean section with bilateral tubal ligation (N/A, 07/05/2017)., family history includes Brain cancer in her cousin and paternal grandfather; Breast cancer (age of onset: 56) in her paternal aunt, paternal aunt, and paternal aunt; Diabetes in her father and mother; Heart attack (age of onset: 33) in her father; Heart failure in her mother; Hypertension in her father and mother; Migraines in her mother.,  reports that she has never smoked. She has never used smokeless tobacco. She reports that she does not drink alcohol or use drugs.  She has a current medication list which includes the following prescription(s): phentermine. Also, is allergic to coconut flavor and morphine and related.  Review of Systems  All other systems reviewed and are negative.   Objective: BP 118/72   Pulse 65   Wt 184 lb (83.5 kg)   LMP 06/25/2018   BMI 28.82 kg/m   Physical examination Constitutional NAD, Conversant  Skin No rashes, lesions or ulceration.   Extremities: Moves all appropriately.  Normal ROM for age. No lymphadenopathy.  Neuro: Grossly  intact  Psych: Oriented to PPT.  Normal mood. Normal affect.   Assessment:  Chronic RLQ pain and Endometriosis    Plan- Options for hysterectomy (preservation of right ovary) discussed, vs Lupron again, [place], alternatives.    Prefers TLH BS with preservation of right ovary.  Risks of continued endometriosis with right ovary to stimulate discussed, weighed against risks of early menopause and treatments there.    Recovery also discussed.  Work restrictions (paramedic).  A total of 15 minutes were spent face-to-face with the patient during this encounter and over half of that time dealt with counseling and coordination of care.   Barnett Applebaum, MD, [facility], [facility] 07/03/2018  5:05 PM

## 2018-07-05 ENCOUNTER — Telehealth: Payer: Self-pay | Admitting: Obstetrics & Gynecology

## 2018-07-05 NOTE — Telephone Encounter (Signed)
OR dates were given. Patient will talk to her husband and call back.

## 2018-07-05 NOTE — Telephone Encounter (Signed)
-----   Message from Gae Dry, MD sent at 07/03/2018  5:03 PM EDT ----- Regarding: Surgery Surgery Booking Request Patient Full Name:  SHAHIDA SCHNACKENBERG  MRN: [id number]  DOB: 04/08/1990  Surgeon: Hoyt Koch, MD  Requested Surgery Date and Time: Next available Primary Diagnosis AND Code: Endometriosis, Pelvic pain Secondary Diagnosis and Code:  Surgical Procedure: TLH/BS L&D Notification: No Admission Status: same day surgery Length of Surgery: 1 hr Special Case Needs: No H&P: yes (date) Phone Interview???: yes Interpreter: Language:  Medical Clearance: no Special Scheduling Instructions: no Acuity: P3

## 2018-07-26 NOTE — Telephone Encounter (Signed)
Patient is aware of H&P on 8/24 @ 4:30pm w/ Dr. Kenton Kingfisher, Pre-admit testing and covid testing to be scheduled, and OR on 09/07/18. Patient confirmed UMR. Patient is aware of phone check-in, mask, and no visitors for Accident.

## 2018-08-28 ENCOUNTER — Encounter: Payer: Commercial Managed Care - PPO | Admitting: Obstetrics & Gynecology

## 2018-08-29 ENCOUNTER — Encounter: Payer: Self-pay | Admitting: Obstetrics & Gynecology

## 2018-08-29 ENCOUNTER — Ambulatory Visit (INDEPENDENT_AMBULATORY_CARE_PROVIDER_SITE_OTHER): Payer: Commercial Managed Care - PPO | Admitting: Obstetrics & Gynecology

## 2018-08-29 ENCOUNTER — Other Ambulatory Visit: Payer: Self-pay

## 2018-08-29 VITALS — BP 100/60 | Ht 67.0 in | Wt 182.0 lb

## 2018-08-29 DIAGNOSIS — N809 Endometriosis, unspecified: Secondary | ICD-10-CM | POA: Diagnosis not present

## 2018-08-29 DIAGNOSIS — G8929 Other chronic pain: Secondary | ICD-10-CM | POA: Diagnosis not present

## 2018-08-29 DIAGNOSIS — R102 Pelvic and perineal pain: Secondary | ICD-10-CM | POA: Insufficient documentation

## 2018-08-29 DIAGNOSIS — R1031 Right lower quadrant pain: Secondary | ICD-10-CM | POA: Diagnosis not present

## 2018-08-29 NOTE — Patient Instructions (Signed)

## 2018-08-29 NOTE — Progress Notes (Signed)
PRE-OPERATIVE HISTORY AND PHYSICAL EXAM  HPI:  Stephanie Hayden is a 28 y.o. [id number] Patient's last menstrual period was 08/21/2018.; she is being admitted for surgery related to pelvic pain.Pt continues with RLQ pain worse w periods and sex and radiates to deep pelvis and back, relieved minimally w OTC meds and relaxation, with prior context of endmetriosis. She is now done w child bearing, recent CS.  She has had prior surgeries for endometriosis including prior left oophorectomy.  Prior Lupron.  Symptoms escalating over last few months.  PMHx: Past Medical History:  Diagnosis Date  . Endometriosis   . Family history of breast cancer    genetic testing letter sent 5/18  . GERD (gastroesophageal reflux disease)    ONLY WHEN PREGNANT  . Phlebitis following infusion    LEFT ARM-AFTER HAVING WISDOME TEETH TAKEN OUT IN APRIL 2017  . Urinary tract infection    Past Surgical History:  Procedure Laterality Date  . ABLATION ON ENDOMETRIOSIS  2017  . CESAREAN SECTION  2011  . CESAREAN SECTION WITH BILATERAL TUBAL LIGATION N/A 07/05/2017   Procedure: CESAREAN SECTION WITH BILATERAL TUBAL LIGATION;  Surgeon: Will Bonnet, MD;  Location: [facility] OR[facility];  Service: Obstetrics;  Laterality: N/A;  . CHROMOPERTUBATION N/A 06/06/2015   Procedure: CHROMOPERTUBATION;  Surgeon: Gae Dry, MD;  Location: [facility] ORS;  Service: Gynecology;  Laterality: N/A;  . LAPAROSCOPY N/A 06/06/2015   Procedure: LAPAROSCOPY OPERATIVE/ EXCISION OF ENDOMETRIOSIS;  Surgeon: Gae Dry, MD;  Location: [facility] ORS;  Service: Gynecology;  Laterality: N/A;  . TONSILLECTOMY    . WISDOM TOOTH EXTRACTION  04-2015   Family History  Problem Relation Age of Onset  . Hypertension Mother   . Diabetes Mother   . Migraines Mother   . Heart failure Mother        Coded during delivery of second child  . Hypertension Father   . Diabetes Father   . Heart attack Father 2  . Brain cancer Paternal Grandfather   . Brain cancer  Cousin   . Breast cancer Paternal Aunt 7  . Breast cancer Paternal Aunt 71  . Breast cancer Paternal Aunt 77   Social History   Tobacco Use  . Smoking status: Never Smoker  . Smokeless tobacco: Never Used  Substance Use Topics  . Alcohol use: No  . Drug use: No    Current Outpatient Medications:  .  acetaminophen (TYLENOL) 325 MG tablet, Take 650 mg by mouth every 6 (six) hours as needed (for pain.)., Disp: , Rfl:  .  ibuprofen (ADVIL) 200 MG tablet, Take 600 mg by mouth every 8 (eight) hours as needed (pain.)., Disp: , Rfl:  Allergies: Coconut flavor and Morphine and related  Review of Systems  Constitutional: Negative for chills, fever and malaise/fatigue.  HENT: Negative for congestion, sinus pain and sore throat.   Eyes: Negative for blurred vision and pain.  Respiratory: Negative for cough and wheezing.   Cardiovascular: Negative for chest pain and leg swelling.  Gastrointestinal: Negative for abdominal pain, constipation, diarrhea, heartburn, nausea and vomiting.  Genitourinary: Negative for dysuria, frequency, hematuria and urgency.  Musculoskeletal: Negative for back pain, joint pain, myalgias and neck pain.  Skin: Negative for itching and rash.  Neurological: Negative for dizziness, tremors and weakness.  Endo/Heme/Allergies: Does not bruise/bleed easily.  Psychiatric/Behavioral: Negative for depression. The patient is not nervous/anxious and does not have insomnia.     Objective: BP 100/60   Ht 5\' 7"  (1.702 m)  Wt 182 lb (82.6 kg)   LMP 08/21/2018   BMI 28.51 kg/m   Filed Weights   08/29/18 1427  Weight: 182 lb (82.6 kg)   Physical Exam Constitutional:      General: She is not in acute distress.    Appearance: She is well-developed.  HENT:     Head: Normocephalic and atraumatic. No laceration.     Right Ear: Hearing normal.     Left Ear: Hearing normal.     Mouth/Throat:     Pharynx: Uvula midline.  Eyes:     Pupils: Pupils are equal, round, and  reactive to light.  Neck:     Musculoskeletal: Normal range of motion and neck supple.     Thyroid: No thyromegaly.  Cardiovascular:     Rate and Rhythm: Normal rate and regular rhythm.     Heart sounds: No murmur. No friction rub. No gallop.   Pulmonary:     Effort: Pulmonary effort is normal. No respiratory distress.     Breath sounds: Normal breath sounds. No wheezing.  Chest:     Breasts:        Right: No mass, skin change or tenderness.        Left: No mass, skin change or tenderness.  Abdominal:     General: Bowel sounds are normal. There is no distension.     Palpations: Abdomen is soft.     Tenderness: There is no abdominal tenderness. There is no rebound.  Musculoskeletal: Normal range of motion.  Neurological:     Mental Status: She is alert and oriented to person, place, and time.     Cranial Nerves: No cranial nerve deficit.  Skin:    General: Skin is warm and dry.  Psychiatric:        Judgment: Judgment normal.  Vitals signs reviewed.     Assessment: 1. Endometriosis   2. Chronic RLQ pain   3. Pelvic pain   Plan TLH BS Cysto Preservation of right ovary discussed  I have had a careful discussion with this patient about all the options available and the risk/benefits of each. I have fully informed this patient that surgery may subject her to a variety of discomforts and risks: She understands that most patients have surgery with little difficulty, but problems can happen ranging from minor to fatal. These include nausea, vomiting, pain, bleeding, infection, poor healing, hernia, or formation of adhesions. Unexpected reactions may occur from any drug or anesthetic given. Unintended injury may occur to other pelvic or abdominal structures such as Fallopian tubes, ovaries, bladder, ureter (tube from kidney to bladder), or bowel. Nerves going from the pelvis to the legs may be injured. Any such injury may require immediate or later additional surgery to correct the  problem. Excessive blood loss requiring transfusion is very unlikely but possible. Dangerous blood clots may form in the legs or lungs. Physical and sexual activity will be restricted in varying degrees for an indeterminate period of time but most often 2-6 weeks.  Finally, she understands that it is impossible to list every possible undesirable effect and that the condition for which surgery is done is not always cured or significantly improved, and in rare cases may be even worse.Ample time was given to answer all questions.  Barnett Applebaum, MD, [facility], [facility] 08/29/2018  2:39 PM

## 2018-08-29 NOTE — H&P (View-Only) (Signed)
PRE-OPERATIVE HISTORY AND PHYSICAL EXAM  HPI:  Stephanie Hayden is a 28 y.o. [id number] Patient's last menstrual period was 08/21/2018.; she is being admitted for surgery related to pelvic pain.Pt continues with RLQ pain worse w periods and sex and radiates to deep pelvis and back, relieved minimally w OTC meds and relaxation, with prior context of endmetriosis. She is now done w child bearing, recent CS.  She has had prior surgeries for endometriosis including prior left oophorectomy.  Prior Lupron.  Symptoms escalating over last few months.  PMHx: Past Medical History:  Diagnosis Date  . Endometriosis   . Family history of breast cancer    genetic testing letter sent 5/18  . GERD (gastroesophageal reflux disease)    ONLY WHEN PREGNANT  . Phlebitis following infusion    LEFT ARM-AFTER HAVING WISDOME TEETH TAKEN OUT IN APRIL 2017  . Urinary tract infection    Past Surgical History:  Procedure Laterality Date  . ABLATION ON ENDOMETRIOSIS  2017  . CESAREAN SECTION  2011  . CESAREAN SECTION WITH BILATERAL TUBAL LIGATION N/A 07/05/2017   Procedure: CESAREAN SECTION WITH BILATERAL TUBAL LIGATION;  Surgeon: Will Bonnet, MD;  Location: [facility] OR[facility];  Service: Obstetrics;  Laterality: N/A;  . CHROMOPERTUBATION N/A 06/06/2015   Procedure: CHROMOPERTUBATION;  Surgeon: Gae Dry, MD;  Location: [facility] ORS;  Service: Gynecology;  Laterality: N/A;  . LAPAROSCOPY N/A 06/06/2015   Procedure: LAPAROSCOPY OPERATIVE/ EXCISION OF ENDOMETRIOSIS;  Surgeon: Gae Dry, MD;  Location: [facility] OR[facility];  Service: Gynecology;  Laterality: N/A;  . TONSILLECTOMY    . WISDOM TOOTH EXTRACTION  04-2015   Family History  Problem Relation Age of Onset  . Hypertension Mother   . Diabetes Mother   . Migraines Mother   . Heart failure Mother        Coded during delivery of second child  . Hypertension Father   . Diabetes Father   . Heart attack Father 63  . Brain cancer Paternal Grandfather   . Brain cancer  Cousin   . Breast cancer Paternal Aunt 85  . Breast cancer Paternal Aunt 44  . Breast cancer Paternal Aunt 2   Social History   Tobacco Use  . Smoking status: Never Smoker  . Smokeless tobacco: Never Used  Substance Use Topics  . Alcohol use: No  . Drug use: No    Current Outpatient Medications:  .  acetaminophen (TYLENOL) 325 MG tablet, Take 650 mg by mouth every 6 (six) hours as needed (for pain.)., Disp: , Rfl:  .  ibuprofen (ADVIL) 200 MG tablet, Take 600 mg by mouth every 8 (eight) hours as needed (pain.)., Disp: , Rfl:  Allergies: Coconut flavor and Morphine and related  Review of Systems  Constitutional: Negative for chills, fever and malaise/fatigue.  HENT: Negative for congestion, sinus pain and sore throat.   Eyes: Negative for blurred vision and pain.  Respiratory: Negative for cough and wheezing.   Cardiovascular: Negative for chest pain and leg swelling.  Gastrointestinal: Negative for abdominal pain, constipation, diarrhea, heartburn, nausea and vomiting.  Genitourinary: Negative for dysuria, frequency, hematuria and urgency.  Musculoskeletal: Negative for back pain, joint pain, myalgias and neck pain.  Skin: Negative for itching and rash.  Neurological: Negative for dizziness, tremors and weakness.  Endo/Heme/Allergies: Does not bruise/bleed easily.  Psychiatric/Behavioral: Negative for depression. The patient is not nervous/anxious and does not have insomnia.     Objective: BP 100/60   Ht 5\' 7"  (1.702 m)  Wt 182 lb (82.6 kg)   LMP 08/21/2018   BMI 28.51 kg/m   Filed Weights   08/29/18 1427  Weight: 182 lb (82.6 kg)   Physical Exam Constitutional:      General: She is not in acute distress.    Appearance: She is well-developed.  HENT:     Head: Normocephalic and atraumatic. No laceration.     Right Ear: Hearing normal.     Left Ear: Hearing normal.     Mouth/Throat:     Pharynx: Uvula midline.  Eyes:     Pupils: Pupils are equal, round, and  reactive to light.  Neck:     Musculoskeletal: Normal range of motion and neck supple.     Thyroid: No thyromegaly.  Cardiovascular:     Rate and Rhythm: Normal rate and regular rhythm.     Heart sounds: No murmur. No friction rub. No gallop.   Pulmonary:     Effort: Pulmonary effort is normal. No respiratory distress.     Breath sounds: Normal breath sounds. No wheezing.  Chest:     Breasts:        Right: No mass, skin change or tenderness.        Left: No mass, skin change or tenderness.  Abdominal:     General: Bowel sounds are normal. There is no distension.     Palpations: Abdomen is soft.     Tenderness: There is no abdominal tenderness. There is no rebound.  Musculoskeletal: Normal range of motion.  Neurological:     Mental Status: She is alert and oriented to person, place, and time.     Cranial Nerves: No cranial nerve deficit.  Skin:    General: Skin is warm and dry.  Psychiatric:        Judgment: Judgment normal.  Vitals signs reviewed.     Assessment: 1. Endometriosis   2. Chronic RLQ pain   3. Pelvic pain   Plan TLH BS Cysto Preservation of right ovary discussed  I have had a careful discussion with this patient about all the options available and the risk/benefits of each. I have fully informed this patient that surgery may subject her to a variety of discomforts and risks: She understands that most patients have surgery with little difficulty, but problems can happen ranging from minor to fatal. These include nausea, vomiting, pain, bleeding, infection, poor healing, hernia, or formation of adhesions. Unexpected reactions may occur from any drug or anesthetic given. Unintended injury may occur to other pelvic or abdominal structures such as Fallopian tubes, ovaries, bladder, ureter (tube from kidney to bladder), or bowel. Nerves going from the pelvis to the legs may be injured. Any such injury may require immediate or later additional surgery to correct the  problem. Excessive blood loss requiring transfusion is very unlikely but possible. Dangerous blood clots may form in the legs or lungs. Physical and sexual activity will be restricted in varying degrees for an indeterminate period of time but most often 2-6 weeks.  Finally, she understands that it is impossible to list every possible undesirable effect and that the condition for which surgery is done is not always cured or significantly improved, and in rare cases may be even worse.Ample time was given to answer all questions.  Barnett Applebaum, MD, [facility], [facility] 08/29/2018  2:39 PM

## 2018-08-31 ENCOUNTER — Other Ambulatory Visit: Payer: Commercial Managed Care - PPO

## 2018-09-01 ENCOUNTER — Encounter
Admission: RE | Admit: 2018-09-01 | Discharge: 2018-09-01 | Disposition: A | Discharge status: Home / Self Care | Payer: Commercial Managed Care - PPO | Source: Ambulatory Visit | Attending: Obstetrics & Gynecology | Admitting: Obstetrics & Gynecology

## 2018-09-01 ENCOUNTER — Other Ambulatory Visit: Payer: Self-pay

## 2018-09-01 HISTORY — DX: Family history of other specified conditions: Z84.89

## 2018-09-01 HISTORY — DX: Other complications of anesthesia, initial encounter: T88.59XA

## 2018-09-01 NOTE — Patient Instructions (Signed)
Your procedure is scheduled on: Thursday 09/07/18 Report to [place]. To find out your arrival time please call [phone] between 1PM - 3PM on Wednesday 09/06/18.  Remember: Instructions that are not followed completely may result in serious medical risk, up to and including death, or upon the discretion of your surgeon and anesthesiologist your surgery may need to be rescheduled.     _X__ 1. Do not eat food after midnight the night before your procedure.                 No gum chewing or hard candies. You may drink clear liquids up to 2 hours                 before you are scheduled to arrive for your surgery- DO not drink clear                 liquids within 2 hours of the start of your surgery.                 Clear Liquids include:  water, apple juice without pulp, clear carbohydrate                 drink such as Clearfast or Gatorade, Black Coffee or Tea (Do not add                 anything to coffee or tea). Diabetics water only  __X__2.  On the morning of surgery brush your teeth with toothpaste and water, you                 may rinse your mouth with mouthwash if you wish.  Do not swallow any              toothpaste of mouthwash.     _X__ 3.  No Alcohol for 24 hours before or after surgery.   _X__ 4.  Do Not Smoke or use e-cigarettes For 24 Hours Prior to Your Surgery.                 Do not use any chewable tobacco products for at least 6 hours prior to                 surgery.  ____  5.  Bring all medications with you on the day of surgery if instructed.   __X__  6.  Notify your doctor if there is any change in your medical condition      (cold, fever, infections).     Do not wear jewelry, make-up, hairpins, clips or nail polish. Do not wear lotions, powders, or perfumes.  Do not shave 48 hours prior to surgery. Men may shave face and neck. Do not bring valuables to the hospital.    [facility] is not responsible for  any belongings or valuables.  Contacts, dentures/partials or body piercings may not be worn into surgery. Bring a case for your contacts, glasses or hearing aids, a denture cup will be supplied. Leave your suitcase in the car. After surgery it may be brought to your room. For patients admitted to the hospital, discharge time is determined by your treatment team.   Patients discharged the day of surgery will not be allowed to drive home.   Please read over the following fact sheets that you were given:   MRSA Information  __X__ Take these medicines the morning of surgery with A SIP OF WATER:  1. none  2.   3.   4.  5.  6.  ____ Fleet Enema (as directed)   __X__ Use CHG Soap/SAGE wipes as directed  ____ Use inhalers on the day of surgery  ____ Stop metformin/Janumet/Farxiga 2 days prior to surgery    ____ Take 1/2 of usual insulin dose the night before surgery. No insulin the morning          of surgery.   ____ Stop Blood Thinners Coumadin/Plavix/Xarelto/Pleta/Pradaxa/Eliquis/Effient/Aspirin  on   Or contact your Surgeon, Cardiologist or Medical Doctor regarding  ability to stop your blood thinners  __X__ Stop Anti-inflammatories 7 days before surgery such as Advil, Ibuprofen, Motrin,  BC or Goodies Powder, Naprosyn, Naproxen, Aleve, Aspirin    __X__ Stop all herbal supplements, fish oil or vitamin E until after surgery.    ____ Bring C-Pap to the hospital.     Telephone interview. Verbal instructions given. Written instructions to be picked up day of Covid 19 testing (CHG and Incentive Spirometer also provided)./cn

## 2018-09-04 ENCOUNTER — Other Ambulatory Visit: Payer: Self-pay

## 2018-09-04 ENCOUNTER — Other Ambulatory Visit
Admission: RE | Admit: 2018-09-04 | Discharge: 2018-09-04 | Disposition: A | Discharge status: Home / Self Care | Payer: Commercial Managed Care - PPO | Source: Ambulatory Visit | Attending: Obstetrics & Gynecology | Admitting: Obstetrics & Gynecology

## 2018-09-04 DIAGNOSIS — Z01812 Encounter for preprocedural laboratory examination: Secondary | ICD-10-CM | POA: Insufficient documentation

## 2018-09-04 DIAGNOSIS — Z20828 Contact with and (suspected) exposure to other viral communicable diseases: Secondary | ICD-10-CM | POA: Diagnosis not present

## 2018-09-04 LAB — CBC
HCT: 46.1 % — ABNORMAL HIGH (ref 36.0–46.0)
Hemoglobin: 15.2 g/dL — ABNORMAL HIGH (ref 12.0–15.0)
MCH: 30.1 pg (ref 26.0–34.0)
MCHC: 33 g/dL (ref 30.0–36.0)
MCV: 91.3 fL (ref 80.0–100.0)
Platelets: 343 10*3/uL (ref 150–400)
RBC: 5.05 MIL/uL (ref 3.87–5.11)
RDW: 12.4 % (ref 11.5–15.5)
WBC: 7.8 10*3/uL (ref 4.0–10.5)
nRBC: 0 % (ref 0.0–0.2)

## 2018-09-04 LAB — TYPE AND SCREEN
ABO/RH(D): A POS
Antibody Screen: NEGATIVE

## 2018-09-04 LAB — SARS CORONAVIRUS 2 (TAT 6-24 HRS): SARS Coronavirus 2: NEGATIVE

## 2018-09-05 HISTORY — PX: TOTAL LAPAROSCOPIC HYSTERECTOMY WITH SALPINGECTOMY: SHX6742

## 2018-09-06 MED ORDER — SODIUM CHLORIDE 0.9 % IV SOLN
2.0000 g | INTRAVENOUS | Status: AC
  Administered 2018-09-07: 2 g via INTRAVENOUS

## 2018-09-07 ENCOUNTER — Encounter: Payer: Self-pay | Admitting: *Deleted

## 2018-09-07 ENCOUNTER — Ambulatory Visit
Admission: RE | Admit: 2018-09-07 | Discharge: 2018-09-07 | Disposition: A | Discharge status: Home / Self Care | Payer: Commercial Managed Care - PPO | Attending: Obstetrics & Gynecology | Admitting: Obstetrics & Gynecology

## 2018-09-07 ENCOUNTER — Encounter
Admission: RE | Disposition: A | Discharge status: Home / Self Care | Payer: Self-pay | Source: Home / Self Care | Attending: Obstetrics & Gynecology

## 2018-09-07 ENCOUNTER — Ambulatory Visit: Payer: Commercial Managed Care - PPO | Admitting: Certified Registered Nurse Anesthetist

## 2018-09-07 DIAGNOSIS — Z90721 Acquired absence of ovaries, unilateral: Secondary | ICD-10-CM | POA: Insufficient documentation

## 2018-09-07 DIAGNOSIS — N800 Endometriosis of uterus: Secondary | ICD-10-CM | POA: Insufficient documentation

## 2018-09-07 DIAGNOSIS — N809 Endometriosis, unspecified: Secondary | ICD-10-CM | POA: Diagnosis present

## 2018-09-07 DIAGNOSIS — R102 Pelvic and perineal pain: Secondary | ICD-10-CM | POA: Diagnosis present

## 2018-09-07 DIAGNOSIS — D259 Leiomyoma of uterus, unspecified: Secondary | ICD-10-CM | POA: Insufficient documentation

## 2018-09-07 DIAGNOSIS — D252 Subserosal leiomyoma of uterus: Secondary | ICD-10-CM

## 2018-09-07 DIAGNOSIS — N946 Dysmenorrhea, unspecified: Secondary | ICD-10-CM | POA: Diagnosis present

## 2018-09-07 HISTORY — PX: TOTAL LAPAROSCOPIC HYSTERECTOMY WITH BILATERAL SALPINGO OOPHORECTOMY: SHX6845

## 2018-09-07 LAB — POCT PREGNANCY, URINE: Preg Test, Ur: NEGATIVE

## 2018-09-07 SURGERY — HYSTERECTOMY, TOTAL, LAPAROSCOPIC, WITH BILATERAL SALPINGO-OOPHORECTOMY
Anesthesia: General

## 2018-09-07 MED ORDER — OXYCODONE HCL 5 MG PO TABS
ORAL_TABLET | ORAL | Status: AC
  Filled 2018-09-07: qty 1

## 2018-09-07 MED ORDER — ROCURONIUM BROMIDE 100 MG/10ML IV SOLN
INTRAVENOUS | Status: DC | PRN
  Administered 2018-09-07: 50 mg via INTRAVENOUS

## 2018-09-07 MED ORDER — ACETAMINOPHEN 325 MG PO TABS: 650.0000 mg | ORAL_TABLET | ORAL | Status: DC | PRN

## 2018-09-07 MED ORDER — FENTANYL CITRATE (PF) 100 MCG/2ML IJ SOLN
INTRAMUSCULAR | Status: AC
  Filled 2018-09-07: qty 2

## 2018-09-07 MED ORDER — KETOROLAC TROMETHAMINE 30 MG/ML IJ SOLN
INTRAMUSCULAR | Status: DC | PRN
  Administered 2018-09-07: 30 mg via INTRAVENOUS

## 2018-09-07 MED ORDER — KETOROLAC TROMETHAMINE 30 MG/ML IJ SOLN
30.0000 mg | Freq: Four times a day (QID) | INTRAMUSCULAR | Status: DC
  Administered 2018-09-07: 14:00:00 30 mg via INTRAVENOUS
  Filled 2018-09-07: qty 1

## 2018-09-07 MED ORDER — MIDAZOLAM HCL 2 MG/2ML IJ SOLN
INTRAMUSCULAR | Status: AC
  Filled 2018-09-07: qty 2

## 2018-09-07 MED ORDER — FAMOTIDINE 20 MG PO TABS
ORAL_TABLET | ORAL | Status: AC
  Administered 2018-09-07: 07:00:00 20 mg via ORAL
  Filled 2018-09-07: qty 1

## 2018-09-07 MED ORDER — FENTANYL CITRATE (PF) 100 MCG/2ML IJ SOLN
INTRAMUSCULAR | Status: DC | PRN
  Administered 2018-09-07: 25 ug via INTRAVENOUS
  Administered 2018-09-07: 100 ug via INTRAVENOUS

## 2018-09-07 MED ORDER — MEPERIDINE HCL 50 MG/ML IJ SOLN: 6.2500 mg | INTRAMUSCULAR | Status: DC | PRN

## 2018-09-07 MED ORDER — SODIUM CHLORIDE 0.9 % IV SOLN
INTRAVENOUS | Status: AC
  Filled 2018-09-07: qty 2

## 2018-09-07 MED ORDER — LACTATED RINGERS IV SOLN
INTRAVENOUS | Status: DC
  Administered 2018-09-07: 07:00:00 via INTRAVENOUS

## 2018-09-07 MED ORDER — ACETAMINOPHEN NICU IV SYRINGE 10 MG/ML
INTRAVENOUS | Status: AC
  Filled 2018-09-07: qty 1

## 2018-09-07 MED ORDER — FAMOTIDINE 20 MG PO TABS
20.0000 mg | ORAL_TABLET | Freq: Once | ORAL | Status: AC
  Administered 2018-09-07: 07:00:00 20 mg via ORAL

## 2018-09-07 MED ORDER — FENTANYL CITRATE (PF) 100 MCG/2ML IJ SOLN
INTRAMUSCULAR | Status: AC
  Administered 2018-09-07: 09:00:00 25 ug via INTRAVENOUS
  Filled 2018-09-07: qty 2

## 2018-09-07 MED ORDER — ACETAMINOPHEN 650 MG RE SUPP
650.0000 mg | RECTAL | Status: DC | PRN
  Filled 2018-09-07: qty 1

## 2018-09-07 MED ORDER — LIDOCAINE HCL (CARDIAC) PF 100 MG/5ML IV SOSY
PREFILLED_SYRINGE | INTRAVENOUS | Status: DC | PRN
  Administered 2018-09-07: 60 mg via INTRAVENOUS

## 2018-09-07 MED ORDER — KETOROLAC TROMETHAMINE 30 MG/ML IJ SOLN: 30.0000 mg | Freq: Once | INTRAMUSCULAR | Status: DC | PRN

## 2018-09-07 MED ORDER — MIDAZOLAM HCL 2 MG/2ML IJ SOLN
INTRAMUSCULAR | Status: DC | PRN
  Administered 2018-09-07: 2 mg via INTRAVENOUS

## 2018-09-07 MED ORDER — ONDANSETRON HCL 4 MG/2ML IJ SOLN
INTRAMUSCULAR | Status: DC | PRN
  Administered 2018-09-07: 4 mg via INTRAVENOUS

## 2018-09-07 MED ORDER — BUPIVACAINE HCL (PF) 0.5 % IJ SOLN
INTRAMUSCULAR | Status: DC | PRN
  Administered 2018-09-07: 16 mL

## 2018-09-07 MED ORDER — DEXAMETHASONE SODIUM PHOSPHATE 10 MG/ML IJ SOLN
INTRAMUSCULAR | Status: DC | PRN
  Administered 2018-09-07: 10 mg via INTRAVENOUS

## 2018-09-07 MED ORDER — FENTANYL CITRATE (PF) 100 MCG/2ML IJ SOLN
25.0000 ug | INTRAMUSCULAR | Status: DC | PRN
  Administered 2018-09-07 (×2): 25 ug via INTRAVENOUS

## 2018-09-07 MED ORDER — FENTANYL CITRATE (PF) 100 MCG/2ML IJ SOLN
25.0000 ug | INTRAMUSCULAR | Status: DC | PRN
  Administered 2018-09-07 (×3): 25 ug via INTRAVENOUS
  Administered 2018-09-07: 09:00:00 50 ug via INTRAVENOUS
  Administered 2018-09-07: 25 ug via INTRAVENOUS

## 2018-09-07 MED ORDER — FENTANYL CITRATE (PF) 100 MCG/2ML IJ SOLN
INTRAMUSCULAR | Status: AC
  Administered 2018-09-07: 09:00:00 50 ug via INTRAVENOUS
  Filled 2018-09-07: qty 2

## 2018-09-07 MED ORDER — PROPOFOL 10 MG/ML IV BOLUS
INTRAVENOUS | Status: DC | PRN
  Administered 2018-09-07: 200 mg via INTRAVENOUS

## 2018-09-07 MED ORDER — LACTATED RINGERS IV SOLN
INTRAVENOUS | Status: DC
  Administered 2018-09-07: 11:00:00 via INTRAVENOUS

## 2018-09-07 MED ORDER — SODIUM CHLORIDE FLUSH 0.9 % IV SOLN
INTRAVENOUS | Status: AC
  Filled 2018-09-07: qty 10

## 2018-09-07 MED ORDER — ACETAMINOPHEN 160 MG/5ML PO SOLN
325.0000 mg | ORAL | Status: DC | PRN
  Filled 2018-09-07: qty 20.3

## 2018-09-07 MED ORDER — OXYCODONE-ACETAMINOPHEN 5-325 MG PO TABS: 1.0000 | ORAL_TABLET | ORAL | Status: DC | PRN

## 2018-09-07 MED ORDER — PROPOFOL 10 MG/ML IV BOLUS
INTRAVENOUS | Status: AC
  Filled 2018-09-07: qty 20

## 2018-09-07 MED ORDER — ACETAMINOPHEN 10 MG/ML IV SOLN
INTRAVENOUS | Status: DC | PRN
  Administered 2018-09-07: 1000 mg via INTRAVENOUS

## 2018-09-07 MED ORDER — OXYCODONE-ACETAMINOPHEN 5-325 MG PO TABS: 1.0000 | ORAL_TABLET | ORAL | 0 refills | Status: DC | PRN

## 2018-09-07 MED ORDER — ACETAMINOPHEN 325 MG PO TABS: 325.0000 mg | ORAL_TABLET | ORAL | Status: DC | PRN

## 2018-09-07 MED ORDER — OXYCODONE HCL 5 MG PO TABS
5.0000 mg | ORAL_TABLET | Freq: Once | ORAL | Status: AC
  Administered 2018-09-07: 13:00:00 5 mg via ORAL

## 2018-09-07 MED ORDER — PROMETHAZINE HCL 25 MG/ML IJ SOLN
6.2500 mg | INTRAMUSCULAR | Status: AC | PRN
  Administered 2018-09-07 (×2): 6.25 mg via INTRAVENOUS

## 2018-09-07 MED ORDER — MORPHINE SULFATE (PF) 4 MG/ML IV SOLN: 1.0000 mg | INTRAVENOUS | Status: DC | PRN

## 2018-09-07 MED ORDER — KETOROLAC TROMETHAMINE 30 MG/ML IJ SOLN
INTRAMUSCULAR | Status: AC
  Filled 2018-09-07: qty 1

## 2018-09-07 MED ORDER — PROMETHAZINE HCL 25 MG/ML IJ SOLN
INTRAMUSCULAR | Status: AC
  Administered 2018-09-07: 11:00:00 6.25 mg via INTRAVENOUS
  Filled 2018-09-07: qty 1

## 2018-09-07 MED ORDER — SUGAMMADEX SODIUM 200 MG/2ML IV SOLN
INTRAVENOUS | Status: DC | PRN
  Administered 2018-09-07: 158 mg via INTRAVENOUS

## 2018-09-07 SURGICAL SUPPLY — 54 items
ADH SKN CLS APL DERMABOND .7 (GAUZE/BANDAGES/DRESSINGS) ×1
APL PRP STRL LF DISP 70% ISPRP (MISCELLANEOUS) ×1
BAG URINE DRAINAGE (UROLOGICAL SUPPLIES) ×3 IMPLANT
BLADE SURG SZ11 CARB STEEL (BLADE) ×3 IMPLANT
CANISTER SUCT 1200ML W/VALVE (MISCELLANEOUS) ×3 IMPLANT
CATH FOLEY 2WAY  5CC 16FR (CATHETERS) ×2
CATH FOLEY 2WAY 5CC 16FR (CATHETERS) ×1
CATH URTH 16FR FL 2W BLN LF (CATHETERS) ×1 IMPLANT
CHLORAPREP W/TINT 26 (MISCELLANEOUS) ×3 IMPLANT
COVER WAND RF STERILE (DRAPES) ×3 IMPLANT
DEFOGGER SCOPE WARMER CLEARIFY (MISCELLANEOUS) ×3 IMPLANT
DERMABOND ADVANCED (GAUZE/BANDAGES/DRESSINGS) ×2
DERMABOND ADVANCED .7 DNX12 (GAUZE/BANDAGES/DRESSINGS) ×1 IMPLANT
DEVICE SUTURE ENDOST 10MM (ENDOMECHANICALS) ×2 IMPLANT
DRAPE CAMERA CLOSED 9X96 (DRAPES) ×3 IMPLANT
DRSG TEGADERM 2-3/8X2-3/4 SM (GAUZE/BANDAGES/DRESSINGS) ×2 IMPLANT
GLOVE BIO SURGEON STRL SZ8 (GLOVE) ×15 IMPLANT
GLOVE INDICATOR 8.0 STRL GRN (GLOVE) ×3 IMPLANT
GOWN STRL REUS W/ TWL LRG LVL3 (GOWN DISPOSABLE) ×1 IMPLANT
GOWN STRL REUS W/ TWL XL LVL3 (GOWN DISPOSABLE) ×2 IMPLANT
GOWN STRL REUS W/TWL LRG LVL3 (GOWN DISPOSABLE) ×3
GOWN STRL REUS W/TWL XL LVL3 (GOWN DISPOSABLE) ×6
GRASPER SUT TROCAR 14GX15 (MISCELLANEOUS) ×3 IMPLANT
IRRIGATION STRYKERFLOW (MISCELLANEOUS) ×1 IMPLANT
IRRIGATOR STRYKERFLOW (MISCELLANEOUS) ×3
IV LACTATED RINGERS 1000ML (IV SOLUTION) ×6 IMPLANT
KIT PINK PAD W/HEAD ARE REST (MISCELLANEOUS) ×3
KIT PINK PAD W/HEAD ARM REST (MISCELLANEOUS) ×1 IMPLANT
KIT TURNOVER CYSTO (KITS) ×3 IMPLANT
LABEL OR SOLS (LABEL) ×3 IMPLANT
MANIPULATOR VCARE LG CRV RETR (MISCELLANEOUS) IMPLANT
MANIPULATOR VCARE SML CRV RETR (MISCELLANEOUS) IMPLANT
MANIPULATOR VCARE STD CRV RETR (MISCELLANEOUS) ×2 IMPLANT
NEEDLE VERESS 14GA 120MM (NEEDLE) ×3 IMPLANT
NS IRRIG 500ML POUR BTL (IV SOLUTION) ×3 IMPLANT
OCCLUDER COLPOPNEUMO (BALLOONS) ×3 IMPLANT
PACK GYN LAPAROSCOPIC (MISCELLANEOUS) ×3 IMPLANT
PAD OB MATERNITY 4.3X12.25 (PERSONAL CARE ITEMS) ×3 IMPLANT
PAD PREP 24X41 OB/GYN DISP (PERSONAL CARE ITEMS) ×3 IMPLANT
PORT ACCESS TROCAR AIRSEAL 12 (TROCAR) ×1 IMPLANT
PORT ACCESS TROCAR AIRSEAL 5M (TROCAR) ×2
SCISSORS METZENBAUM CVD 33 (INSTRUMENTS) ×2 IMPLANT
SET CYSTO W/LG BORE CLAMP LF (SET/KITS/TRAYS/PACK) ×3 IMPLANT
SET TRI-LUMEN FLTR TB AIRSEAL (TUBING) ×3 IMPLANT
SHEARS HARMONIC ACE PLUS 36CM (ENDOMECHANICALS) ×3 IMPLANT
SLEEVE ENDOPATH XCEL 5M (ENDOMECHANICALS) ×3 IMPLANT
SPONGE GAUZE 2X2 8PLY STER LF (GAUZE/BANDAGES/DRESSINGS) ×1
SPONGE GAUZE 2X2 8PLY STRL LF (GAUZE/BANDAGES/DRESSINGS) ×1 IMPLANT
SUT ENDO VLOC 180-0-8IN (SUTURE) ×4 IMPLANT
SUT VIC AB 0 CT1 36 (SUTURE) ×3 IMPLANT
SUT VIC AB 4-0 FS2 27 (SUTURE) ×3 IMPLANT
SYR 10ML LL (SYRINGE) ×3 IMPLANT
SYR 50ML LL SCALE MARK (SYRINGE) ×3 IMPLANT
TROCAR XCEL NON-BLD 5MMX100MML (ENDOMECHANICALS) ×3 IMPLANT

## 2018-09-07 NOTE — Transfer of Care (Signed)
Immediate Anesthesia Transfer of Care Note  Patient: Stephanie Hayden  Procedure(s) Performed: TOTAL LAPAROSCOPIC HYSTERECTOMY WITH BILATERAL SALPINGO (N/A )  Patient Location: PACU  Anesthesia Type:General  Level of Consciousness: awake, alert  and oriented  Airway & Oxygen Therapy: Patient Spontanous Breathing and Patient connected to nasal cannula oxygen  Post-op Assessment: Report given to RN and Post -op Vital signs reviewed and stable  Post vital signs: Reviewed and stable  Last Vitals:  Vitals Value Taken Time  BP 105/71 09/07/18 0900  Temp 36.2 C 09/07/18 0900  Pulse 79 09/07/18 0902  Resp 19 09/07/18 0902  SpO2 100 % 09/07/18 0902  Vitals shown include unvalidated device data.  Last Pain:  Vitals:   09/07/18 0900  TempSrc:   PainSc: Asleep         Complications: No apparent anesthesia complications

## 2018-09-07 NOTE — Anesthesia Preprocedure Evaluation (Addendum)
Anesthesia Evaluation  Patient identified by MRN, date of birth, ID band Patient awake    Reviewed: Allergy & Precautions, H&P , NPO status , reviewed documented beta blocker date and time   History of Anesthesia Complications (+) Family history of anesthesia reaction and history of anesthetic complications  Airway Mallampati: II  TM Distance: >3 FB Neck ROM: full    Dental  (+) Teeth Intact   Pulmonary    Pulmonary exam normal        Cardiovascular Normal cardiovascular exam  ECHO 06/2017 Study Conclusions  - Left ventricle: The cavity size was normal. Systolic function was   normal. The estimated ejection fraction was in the range of 55%   to 60%. Wall motion was normal; there were no regional wall   motion abnormalities. Left ventricular diastolic function   parameters were normal. - Mitral valve: There was mild regurgitation. - Left atrium: The atrium was mildly dilated. - Right ventricle: Systolic function was normal. - Pulmonary arteries: Systolic pressure was within the normal   range.   Neuro/Psych  Headaches,    GI/Hepatic GERD  Controlled,  Endo/Other    Renal/GU      Musculoskeletal   Abdominal   Peds  Hematology   Anesthesia Other Findings Past Medical History: No date: Complication of anesthesia     Comment:  difficulty waking up - discussed w pt No date: Endometriosis No date: Family history of adverse reaction to anesthesia     Comment:  mother also diffucult to wake up after  No date: Family history of breast cancer     Comment:  genetic testing letter sent 5/18 No date: GERD (gastroesophageal reflux disease)     Comment:  ONLY WHEN PREGNANT No date: Phlebitis following infusion     Comment:  LEFT ARM-AFTER HAVING WISDOME TEETH TAKEN OUT IN APRIL               2017 No date: Urinary tract infection Past Surgical History: 2017: ABLATION ON ENDOMETRIOSIS 2011: CESAREAN SECTION 07/05/2017:  CESAREAN SECTION WITH BILATERAL TUBAL LIGATION; N/A     Comment:  Procedure: CESAREAN SECTION WITH BILATERAL TUBAL               LIGATION;  Surgeon: Will Bonnet, MD;  Location:               [facility] ORS;  Service: Obstetrics;  Laterality: N/A; 06/06/2015: CHROMOPERTUBATION; N/A     Comment:  Procedure: CHROMOPERTUBATION;  Surgeon: Gae Dry,              MD;  Location: [facility] ORS;  Service: Gynecology;                Laterality: N/A; 06/06/2015: LAPAROSCOPY; N/A     Comment:  Procedure: LAPAROSCOPY OPERATIVE/ EXCISION OF               ENDOMETRIOSIS;  Surgeon: Gae Dry, MD;  Location:               [facility] ORS;  Service: Gynecology;  Laterality: N/A; No date: TONSILLECTOMY No date: TUBAL LIGATION 04-2015: WISDOM TOOTH EXTRACTION BMI    Body Mass Index: 27.28 kg/m     Reproductive/Obstetrics                            Anesthesia Physical Anesthesia Plan  ASA: II  Anesthesia Plan: General ETT   Post-op Pain Management:    Induction: Intravenous  PONV Risk Score and Plan: 4 or greater and Ondansetron, Treatment may vary due to age or medical condition, Midazolam and Dexamethasone  Airway Management Planned: Oral ETT  Additional Equipment:   Intra-op Plan:   Post-operative Plan: Extubation in OR  Informed Consent: I have reviewed the patients History and Physical, chart, labs and discussed the procedure including the risks, benefits and alternatives for the proposed anesthesia with the patient or authorized representative who has indicated his/her understanding and acceptance.     Dental Advisory Given  Plan Discussed with: CRNA  Anesthesia Plan Comments:        Anesthesia Quick Evaluation

## 2018-09-07 NOTE — Progress Notes (Signed)
Pt reports 7 out of 10 burning sensation in abdomen and vagina and continued nausea with no vomiting.   Pt given phenergan 6.25mg  x 2 and fentanyl 80mcg x7  Pt tolerating sips of liquids and crackers. Pt states she does not want any more medication at this time; wants to sleep.  I spoke with Dr. Lavone Neri who states she is stable to proceed to post op at this time. Pt agreeable with plan.

## 2018-09-07 NOTE — Anesthesia Procedure Notes (Signed)
Procedure Name: Intubation Date/Time: 09/07/2018 7:41 AM Performed by: Willette Alma, CRNA Pre-anesthesia Checklist: Patient identified, Patient being monitored, Timeout performed, Emergency Drugs available and Suction available Patient Re-evaluated:Patient Re-evaluated prior to induction Oxygen Delivery Method: Circle system utilized Preoxygenation: Pre-oxygenation with 100% oxygen Induction Type: IV induction Ventilation: Mask ventilation without difficulty Laryngoscope Size: 3 and McGraph Grade View: Grade I Tube type: Oral Tube size: 7.0 mm Number of attempts: 1 Airway Equipment and Method: Stylet Placement Confirmation: ETT inserted through vocal cords under direct vision,  positive ETCO2 and breath sounds checked- equal and bilateral Secured at: 21 cm Tube secured with: Tape Dental Injury: Teeth and Oropharynx as per pre-operative assessment

## 2018-09-07 NOTE — Anesthesia Post-op Follow-up Note (Signed)
Anesthesia QCDR form completed.        

## 2018-09-07 NOTE — Interval H&P Note (Signed)
History and Physical Interval Note:  09/07/2018 6:58 AM  Stephanie Hayden  has presented today for surgery, with the diagnosis of ENDOMETRIOSIS, PELVIC PAIN.  The various methods of treatment have been discussed with the patient and family. After consideration of risks, benefits and other options for treatment, the patient has consented to  Procedure(s): TOTAL LAPAROSCOPIC HYSTERECTOMY WITH BILATERAL SALPINGECTOMY, CYSTOSCOPY as a surgical intervention.  The patient's history has been reviewed, patient examined, no change in status, stable for surgery.  I have reviewed the patient's chart and labs.  Questions were answered to the patient's satisfaction.     Hoyt Koch

## 2018-09-07 NOTE — Op Note (Signed)
Operative Report:  PRE-OP DIAGNOSIS: ENDOMETRIOSIS PELVIC PAIN   POST-OP DIAGNOSIS: ENDOMETRIOSIS PELVIC PAIN   PROCEDURE: Procedure(s): TOTAL LAPAROSCOPIC HYSTERECTOMY WITH BILATERAL SALPINGO  SURGEON: Barnett Applebaum, MD, FACOG  ASSISTANT: Dr Georgianne Fick, No other capable assistant available, in surgery requiring high level assistant.  ANESTHESIA: General endotracheal anesthesia  ESTIMATED BLOOD LOSS: 10 mL  SPECIMENS: Uterus, Tubes.  COMPLICATIONS: None  DISPOSITION: stable to PACU  FINDINGS: Intraabdominal adhesions were not noted. Enlarged uterus w adenomyosis and/or fibroid. Normal right ovary Absent left ovary  PROCEDURE:  The patient was taken to the OR where anesthesia was administed. She was prepped and draped in the normal sterile fashion in the dorsal lithotomy position in the [employer] stirrups. A time out was performed. A Graves speculum was inserted, the cervix was grasped with a single tooth tenaculum and the endometrial cavity was sounded. The cervix was progressively dilated to a size 18 [place] with [employer] dilators. A V-Care uterine manipulator was inserted in the usual fashion without incident. Gloves were changed and attention was turned to the abdomen.   An infraumbilical transverse 38mm skin incision was made with the scalpel after local anesthesia applied to the skin. A Veress-step needle was inserted in the usual fashion and confirmed using the hanging drop technique. A pneumoperitoneum was obtained by insufflation of CO2 (opening pressure of 44mmHg) to 32mmHg. A diagnostic laparoscopy was performed yielding the previously described findings. Attention was turned to the left lower quadrant where after visualization of the inferior epigastric vessels a 44mm skin incision was made with the scalpel. A 5 mm laparoscopic port was inserted. The same procedure was repeated in the right lower quadrant with a 36mm trocar. Attention was turned to the left aspect of the uterus, where  after visualization of the ureter, the round ligament was coagulated and transected using the 62mm Harmonic Scapel. The anterior and posterior leafs of the broad ligament were dissected off as the anterior one was coagulated and transected in a caudal direction towards the cuff of the uterine manipulator.  Attention was then turned to the left fallopian tube which was recognized by visualization of the fimbria. The tube is excised to its attachment to the uterus. The uterine-ovarian ligament and its blood vessels were carefully coagulated and transected using the Harmonic scapel.  Attention was turned to the right aspect of the uterus where the same procedure was performed.  The vesicouterine reflection of the peritoneum was dissected with the harmonic scapel and the bladder flap was created bluntly.  The uterine vessels were coagulated and transected bilaterally using first bipolar cautery and then the harmonic scapel. A 360 degree, circumferential colpotomy was done to completely amputate the uterus with cervix and tubes. Once the specimen was amputated it was delivered through the vagina.   The colpotomy was repaired in a simple running fashion using a delayed absorbable suture with an endo-stitch device.  Vaginal exam confirms complete closure.  The cavity was copiously irrigated. A survey of the pelvic cavity revealed adequate hemostasis and no injury to bowel, bladder, or ureter.   A diagnostic cystoscopy was performed using saline distension of bladder with no lesions or injuries noted.  Bilateral urine flow from each ureteral orifice is visualized.  At this point the procedure was finalized. Right lower quadrant fascia incision is closed with a vicryl suture using the fascia closure device. All the instruments were removed from the patient's body. Gas was expelled and patient is leveled.  Incisions are closed with skin adhesive.  Patient goes to recovery room in stable condition.  All sponge,  instrument, and needle counts are correct x2.     Barnett Applebaum, MD, [facility], [facility] 09/07/2018  8:54 AM

## 2018-09-07 NOTE — Anesthesia Postprocedure Evaluation (Signed)
Anesthesia Post Note  Patient: ETHELEAN BIANCA  Procedure(s) Performed: TOTAL LAPAROSCOPIC HYSTERECTOMY WITH BILATERAL SALPINGO (N/A )  Patient location during evaluation: PACU Anesthesia Type: General Level of consciousness: awake and alert Pain management: pain level controlled Vital Signs Assessment: post-procedure vital signs reviewed and stable Respiratory status: spontaneous breathing, nonlabored ventilation and respiratory function stable Cardiovascular status: blood pressure returned to baseline and stable Postop Assessment: no apparent nausea or vomiting Anesthetic complications: no Comments: Pt requested no further pain meds, nausea and pain control to pt's satisfaction     Last Vitals:  Vitals:   09/07/18 1121 09/07/18 1138  BP:  114/78  Pulse: (!) 54 (!) 58  Resp: 16 20  Temp: 36.7 C (!) 36.1 C  SpO2: 99% 100%    Last Pain:  Vitals:   09/07/18 1138  TempSrc: Temporal  PainSc: 7                  Larae Caison Harvie Heck

## 2018-09-07 NOTE — Progress Notes (Signed)
SPoke with Dr Kenton Kingfisher re :pt going home, pt is concerned and he states she is safe to go home dressings cdi, peripad with no draingage, pt able to void and sleep at this time

## 2018-09-07 NOTE — Discharge Instructions (Addendum)
Total Laparoscopic Hysterectomy, Care After °This sheet gives you information about how to care for yourself after your procedure. Your health care provider may also give you more specific instructions. If you have problems or questions, contact your health care provider. °What can I expect after the procedure? °After the procedure, it is common to have: °· Pain and bruising around your incisions. °· A sore throat, if a breathing tube was used during surgery. °· Fatigue. °· Poor appetite. °· Less interest in sex. °If your ovaries were also removed, it is also common to have symptoms of menopause such as hot flashes, night sweats, and lack of sleep (insomnia). °Follow these instructions at home: °Bathing °· Do not take baths, swim, or use a hot tub until your health care provider approves. You may need to only take showers for 2-3 weeks. °· Keep your bandage (dressing) dry until your health care provider says it can be removed. °Incision care ° °· Follow instructions from your health care provider about how to take care of your incisions. Make sure you: °? Wash your hands with soap and water before you change your dressing. If soap and water are not available, use hand sanitizer. °? Change your dressing as told by your health care provider. °? Leave stitches (sutures), skin glue, or adhesive strips in place. These skin closures may need to stay in place for 2 weeks or longer. If adhesive strip edges start to loosen and curl up, you may trim the loose edges. Do not remove adhesive strips completely unless your health care provider tells you to do that. °· Check your incision area every day for signs of infection. Check for: °? Redness, swelling, or pain. °? Fluid or blood. °? Warmth. °? Pus or a bad smell. °Activity °· Get plenty of rest and sleep. °· Do not lift anything that is heavier than 10 lbs (4.5 kg) for one month after surgery, or as long as told by your health care provider. °· Do not drive or use heavy  machinery while taking prescription pain medicine. °· Do not drive for 24 hours if you were given a medicine to help you relax (sedative). °· Return to your normal activities as told by your health care provider. Ask your health care provider what activities are safe for you. °Lifestyle ° °· Do not use any products that contain nicotine or tobacco, such as cigarettes and e-cigarettes. These can delay healing. If you need help quitting, ask your health care provider. °· Do not drink alcohol until your health care provider approves. °General instructions °· Do not douche, use tampons, or have sex for at least 6 weeks, or as told by your health care provider. °· Take over-the-counter and prescription medicines only as told by your health care provider. °· To monitor yourself for a fever, take your temperature at least once a day during recovery. °· If you struggle with physical or emotional changes after your procedure, speak with your health care provider or a therapist. °· To prevent or treat constipation while you are taking prescription pain medicine, your health care provider may recommend that you: °? Drink enough fluid to keep your urine clear or pale yellow. °? Take over-the-counter or prescription medicines. °? Eat foods that are high in fiber, such as fresh fruits and vegetables, whole grains, and beans. °? Limit foods that are high in fat and processed sugars, such as fried and sweet foods. °· Keep all follow-up visits as told by your health care provider.   This is important. °Contact a health care provider if: °· You have chills or a fever. °· You have redness, swelling, or pain around an incision. °· You have fluid or blood coming from an incision. °· Your incision feels warm to the touch. °· You have pus or a bad smell coming from an incision. °· An incision breaks open. °· You feel dizzy or light-headed. °· You have pain or bleeding when you urinate. °· You have diarrhea, nausea, or vomiting that does not  go away. °· You have abnormal vaginal discharge. °· You have a rash. °· You have pain that does not get better with medicine. °Get help right away if: °· You have a fever and your symptoms suddenly get worse. °· You have severe abdominal pain. °· You have chest pain. °· You have shortness of breath. °· You faint. °· You have pain, swelling, or redness on your leg. °· You have heavy vaginal bleeding with blood clots. °Summary °· After the procedure it is common to have abdominal pain. Your provider will give you medication for this. °· Do not take baths, swim, or use a hot tub until your health care provider approves. °· Do not lift anything that is heavier than 10 lbs (4.5 kg) for one month after surgery, or as long as told by your health care provider. °· Notify your provider if you have any signs or symptoms of infection after the procedure. °This information is not intended to replace advice given to you by your health care provider. Make sure you discuss any questions you have with your health care provider. °Document Released: 10/11/2012 Document Revised: 12/03/2016 Document Reviewed: 03/03/2016 °Elsevier Patient Education © 2020 [employer] ° ° °AMBULATORY SURGERY  °DISCHARGE INSTRUCTIONS ° ° °1) The drugs that you were given will stay in your system until tomorrow so for the next 24 hours you should not: ° °A) Drive an automobile °B) Make any legal decisions °C) Drink any alcoholic beverage ° ° °2) You may resume regular meals tomorrow.  Today it is better to start with liquids and gradually work up to solid foods. ° °You may eat anything you prefer, but it is better to start with liquids, then soup and crackers, and gradually work up to solid foods. ° ° °3) Please notify your doctor immediately if you have any unusual bleeding, trouble breathing, redness and pain at the surgery site, drainage, fever, or pain not relieved by medication. ° ° ° °4) Additional Instructions: ° ° ° ° ° ° ° °Please contact your  physician with any problems or Same Day Surgery at [phone], Monday through Friday 6 am to 4 pm, or [place] [facility] number at [phone]. °

## 2018-09-07 NOTE — Progress Notes (Signed)
Pt continues to sleep, awakens and states pain is a 7, voided approx 50cc , bladder scanned for 15 cc, Dr Kenton Kingfisher aware and will come see pt

## 2018-09-10 ENCOUNTER — Telehealth: Payer: Self-pay | Admitting: Obstetrics and Gynecology

## 2018-09-10 ENCOUNTER — Other Ambulatory Visit: Payer: Self-pay | Admitting: Obstetrics and Gynecology

## 2018-09-10 NOTE — Telephone Encounter (Signed)
Patient called the after hours nursing line reporting that she has been having nausea and significant abdominal distension She says that her incisions appear indented on her body. She has had two bowel movements but they have been looser than normal. I advised her that because of this distension she should be seen in the ER for further evaluation and imaging. She seemed reluctant to follow this recommendation. I advised her that our office would not be open until Tuesday.   Adrian Prows MD [facility], [facility] 09/10/2018 11:57 PM

## 2018-09-12 ENCOUNTER — Other Ambulatory Visit: Payer: Self-pay | Admitting: Obstetrics & Gynecology

## 2018-09-12 ENCOUNTER — Ambulatory Visit: Payer: Commercial Managed Care - PPO | Admitting: Obstetrics and Gynecology

## 2018-09-12 ENCOUNTER — Encounter: Payer: Self-pay | Admitting: Obstetrics & Gynecology

## 2018-09-12 ENCOUNTER — Telehealth: Payer: Self-pay | Admitting: Obstetrics & Gynecology

## 2018-09-12 LAB — SURGICAL PATHOLOGY

## 2018-09-12 NOTE — Telephone Encounter (Signed)
Pt aware.

## 2018-09-12 NOTE — Progress Notes (Signed)
Discussed w pt post op course and pathology from [facility]. She is improving from abdominal pain and distension Will keep post op appt this week  Barnett Applebaum, MD, [facility], [facility] 09/12/2018  10:01 AM

## 2018-09-12 NOTE — Telephone Encounter (Signed)
Patient has appt on 09/15/18 for post op and would like to see if she could get put back on the phentermine meds before the appt.   Cb# [phone]

## 2018-09-15 ENCOUNTER — Other Ambulatory Visit: Payer: Self-pay

## 2018-09-15 ENCOUNTER — Ambulatory Visit (INDEPENDENT_AMBULATORY_CARE_PROVIDER_SITE_OTHER): Payer: Commercial Managed Care - PPO | Admitting: Obstetrics & Gynecology

## 2018-09-15 ENCOUNTER — Encounter: Payer: Self-pay | Admitting: Obstetrics & Gynecology

## 2018-09-15 VITALS — BP 120/80 | Ht 67.0 in | Wt 185.0 lb

## 2018-09-15 DIAGNOSIS — Z90710 Acquired absence of both cervix and uterus: Secondary | ICD-10-CM

## 2018-09-15 DIAGNOSIS — Z6828 Body mass index (BMI) 28.0-28.9, adult: Secondary | ICD-10-CM

## 2018-09-15 MED ORDER — PHENTERMINE HCL 37.5 MG PO TABS: 37.5000 mg | ORAL_TABLET | Freq: Every day | ORAL | 1 refills | Status: DC

## 2018-09-15 NOTE — Patient Instructions (Signed)
Phentermine tablets or capsules What is this medicine? PHENTERMINE (FEN ter meen) decreases your appetite. It is used with a reduced calorie diet and exercise to help you lose weight. This medicine may be used for other purposes; ask your health care provider or pharmacist if you have questions. COMMON BRAND NAME(S): Adipex-P, Atti-Plex P, Atti-Plex P Spansule, Fastin, Lomaira, Pro-Fast, Tara-8 What should I tell my health care provider before I take this medicine? They need to know if you have any of these conditions:  agitation or nervousness  diabetes  glaucoma  heart disease  high blood pressure  history of drug abuse or addiction  history of stroke  kidney disease  lung disease called Primary Pulmonary Hypertension (PPH)  taken an MAOI like Carbex, Eldepryl, Marplan, Nardil, or Parnate in last 14 days  taking stimulant medicines for attention disorders, weight loss, or to stay awake  thyroid disease  an unusual or allergic reaction to phentermine, other medicines, foods, dyes, or preservatives  pregnant or trying to get pregnant  breast-feeding How should I use this medicine? Take this medicine by mouth with a glass of water. Follow the directions on the prescription label. The instructions for use may differ based on the product and dose you are taking. Avoid taking this medicine in the evening. It may interfere with sleep. Take your doses at regular intervals. Do not take your medicine more often than directed. Talk to your pediatrician regarding the use of this medicine in children. While this drug may be prescribed for children 17 years or older for selected conditions, precautions do apply. Overdosage: If you think you have taken too much of this medicine contact a poison control center or emergency room at once. NOTE: This medicine is only for you. Do not share this medicine with others. What if I miss a dose? If you miss a dose, take it as soon as you can. If it  is almost time for your next dose, take only that dose. Do not take double or extra doses. What may interact with this medicine? Do not take this medicine with any of the following medications:  MAOIs like Carbex, Eldepryl, Marplan, Nardil, and Parnate  medicines for colds or breathing difficulties like pseudoephedrine or phenylephrine  procarbazine  sibutramine  stimulant medicines for attention disorders, weight loss, or to stay awake This medicine may also interact with the following medications:  certain medicines for depression, anxiety, or psychotic disturbances  linezolid  medicines for diabetes  medicines for high blood pressure This list may not describe all possible interactions. Give your health care provider a list of all the medicines, herbs, non-prescription drugs, or dietary supplements you use. Also tell them if you smoke, drink alcohol, or use illegal drugs. Some items may interact with your medicine. What should I watch for while using this medicine? Notify your physician immediately if you become short of breath while doing your normal activities. Do not take this medicine within 6 hours of bedtime. It can keep you from getting to sleep. Avoid drinks that contain caffeine and try to stick to a regular bedtime every night. This medicine was intended to be used in addition to a healthy diet and exercise. The best results are achieved this way. This medicine is only indicated for short-term use. Eventually your weight loss may level out. At that point, the drug will only help you maintain your new weight. Do not increase or in any way change your dose without consulting your doctor. You may get   drowsy or dizzy. Do not drive, use machinery, or do anything that needs mental alertness until you know how this medicine affects you. Do not stand or sit up quickly, especially if you are an older patient. This reduces the risk of dizzy or fainting spells. Alcohol may increase  dizziness and drowsiness. Avoid alcoholic drinks. What side effects may I notice from receiving this medicine? Side effects that you should report to your doctor or health care professional as soon as possible:  allergic reactions like skin rash, itching or hives, swelling of the face, lips, or tongue)  anxiety  breathing problems  changes in vision  chest pain or chest tightness  depressed mood or other mood changes  hallucinations, loss of contact with reality  fast, irregular heartbeat  increased blood pressure  irritable  nervousness or restlessness  painful urination  palpitations  tremors  trouble sleeping  seizures  signs and symptoms of a stroke like changes in vision; confusion; trouble speaking or understanding; severe headaches; sudden numbness or weakness of the face, arm or leg; trouble walking; dizziness; loss of balance or coordination  unusually weak or tired  vomiting Side effects that usually do not require medical attention (report to your doctor or health care professional if they continue or are bothersome):  constipation or diarrhea  dry mouth  headache  nausea  stomach upset  sweating This list may not describe all possible side effects. Call your doctor for medical advice about side effects. You may report side effects to FDA at 1-800-FDA-1088. Where should I keep my medicine? Keep out of the reach of children. This medicine can be abused. Keep your medicine in a safe place to protect it from theft. Do not share this medicine with anyone. Selling or giving away this medicine is dangerous and against the law. This medicine may cause accidental overdose and death if taken by other adults, children, or pets. Mix any unused medicine with a substance like cat litter or coffee grounds. Then throw the medicine away in a sealed container like a sealed bag or a coffee can with a lid. Do not use the medicine after the expiration date. Store at  room temperature between 20 and 25 degrees C (68 and 77 degrees F). Keep container tightly closed. NOTE: This sheet is a summary. It may not cover all possible information. If you have questions about this medicine, talk to your doctor, pharmacist, or health care provider.  2020 Elsevier/Gold Standard (2016-06-04 08:23:13)  

## 2018-09-15 NOTE — Progress Notes (Signed)
  Postoperative Follow-up Patient presents post op from [facility] for pelvic pain and endometriosis, 1 week ago. Images:  Path DIAGNOSIS:  A. UTERUS WITH CERVIX AND BILATERAL FALLOPIAN TUBES; TOTAL HYSTERECTOMY  WITH BILATERAL SALPINGECTOMY:  - UTERINE CERVIX:    - BENIGN TRANSFORMATION ZONE.    - NEGATIVE FOR SQUAMOUS INTRAEPITHELIAL LESION AND MALIGNANCY.  - ENDOMETRIUM:    - SECRETORY PHASE ENDOMETRIUM.    - NEGATIVE FOR ATYPICAL HYPERPLASIA/EIN AND MALIGNANCY.  - MYOMETRIUM:    - ADENOMYOSIS.    - LEIOMYOMA UTERI.    - NEGATIVE FOR FEATURES OF MALIGNANCY.  - UTERINE SEROSA:  - FIBROUS SEROSAL ADHESIONS WITH FOCAL HEMORRHAGE AND DYSTROPHIC  CALCIFICATION.    - ENDOSALPINGOSIS.  - FALLOPIAN TUBES:    - NO SIGNIFICANT HISTOPATHOLOGIC CHANGE.   Subjective: Patient reports some improvement in her preop symptoms. Eating a regular diet without difficulty. Pain is controlled without any medications.  Activity: normal activities of daily living. Patient reports additional symptom's since surgery of No bleeding.  Objective: BP 120/80   Ht 5\' 7"  (1.702 m)   Wt 185 lb (83.9 kg)   LMP 08/21/2018   BMI 28.98 kg/m  Physical Exam Constitutional:      General: She is not in acute distress.    Appearance: She is well-developed.  Cardiovascular:     Rate and Rhythm: Normal rate.  Pulmonary:     Effort: Pulmonary effort is normal.  Abdominal:     General: There is no distension.     Palpations: Abdomen is soft.     Tenderness: There is no abdominal tenderness.     Comments: Incision Healing Well   Musculoskeletal: Normal range of motion.  Neurological:     Mental Status: She is alert and oriented to person, place, and time.     Cranial Nerves: No cranial nerve deficit.  Skin:    General: Skin is warm and dry.     Assessment: s/p :  total laparoscopic hysterectomy with bilateral salpingectomy progressing well  Plan: Patient has done well after  surgery with no apparent complications.  I have discussed the post-operative course to date, and the expected progress moving forward.  The patient understands what complications to be concerned about.  I will see the patient in routine follow up, or sooner if needed.    Activity plan: No restriction..  Pelvic rest.  Phentermine for BMI elevated (ovberweight)    Anticipate 2 mos therapy    Reassess at next visit in 5 weeks  Hoyt Koch 09/15/2018, 4:16 PM

## 2018-09-18 ENCOUNTER — Telehealth: Payer: Self-pay

## 2018-09-18 NOTE — Telephone Encounter (Signed)
Pt called triage stating she is ecperiancing some brownish discharge, no pain and it is not very heavy. I have advised pt this is probably normal part of the healing process per ams. Pt feels reassured I told pt I would sent this to [facility] just to make him away and pt was understanding and stated she would call if anything changed with the bleeding.

## 2018-09-19 NOTE — Telephone Encounter (Signed)
Check on patient this am.  Brownish vag discharge signifies old blood.  If she has other sx's like itch, odor, increase in discharge, then we can see her this afternoon or this week.

## 2018-09-19 NOTE — Telephone Encounter (Signed)
Pt states shes fine just the old blood

## 2018-10-23 ENCOUNTER — Other Ambulatory Visit: Payer: Self-pay

## 2018-10-23 ENCOUNTER — Ambulatory Visit (INDEPENDENT_AMBULATORY_CARE_PROVIDER_SITE_OTHER): Payer: Commercial Managed Care - PPO | Admitting: Obstetrics & Gynecology

## 2018-10-23 ENCOUNTER — Encounter: Payer: Self-pay | Admitting: Obstetrics & Gynecology

## 2018-10-23 VITALS — BP 110/60 | Ht 67.0 in | Wt 177.0 lb

## 2018-10-23 DIAGNOSIS — Z90710 Acquired absence of both cervix and uterus: Secondary | ICD-10-CM

## 2018-10-23 MED ORDER — PHENTERMINE HCL 37.5 MG PO TABS: 37.5000 mg | ORAL_TABLET | Freq: Every day | ORAL | 0 refills | Status: DC

## 2018-10-23 NOTE — Progress Notes (Signed)
  Postoperative Follow-up Patient presents post op from [facility] for pelvic pain and endometriosis, 6 weeks ago.  Subjective: Patient reports marked improvement in her preop symptoms. Eating a regular diet without difficulty. The patient is not having any pain.  Activity: normal activities of daily living. Patient reports additional symptom's since surgery of No bleeding.  Objective: BP 110/60   Ht 5\' 7"  (1.702 m)   Wt 177 lb (80.3 kg)   BMI 27.72 kg/m  Physical Exam Constitutional:      General: She is not in acute distress.    Appearance: She is well-developed.  Genitourinary:     Pelvic exam was performed with patient supine.     Vagina and rectum normal.     No vaginal erythema or bleeding.     No right or left adnexal mass present.     Right adnexa not tender.     Left adnexa not tender.     Genitourinary Comments: Cervix and uterus absent. Vaginal cuff healing well.  Cardiovascular:     Rate and Rhythm: Normal rate.  Pulmonary:     Effort: Pulmonary effort is normal.  Abdominal:     General: There is no distension.     Palpations: Abdomen is soft.     Tenderness: There is no abdominal tenderness.     Comments: Incision healing well.  Musculoskeletal: Normal range of motion.  Neurological:     Mental Status: She is alert and oriented to person, place, and time.     Cranial Nerves: No cranial nerve deficit.  Skin:    General: Skin is warm and dry.     Assessment: s/p :  total laparoscopic hysterectomy with bilateral salpingectomy progressing well  Plan: Patient has done well after surgery with no apparent complications.  I have discussed the post-operative course to date, and the expected progress moving forward.  The patient understands what complications to be concerned about.  I will see the patient in routine follow up, or sooner if needed.    Activity plan: No restriction.  Hoyt Koch 10/23/2018, 3:46 PM

## 2018-11-02 ENCOUNTER — Other Ambulatory Visit: Payer: Self-pay

## 2018-11-02 DIAGNOSIS — Z20822 Contact with and (suspected) exposure to covid-19: Secondary | ICD-10-CM

## 2018-11-03 ENCOUNTER — Telehealth: Payer: Self-pay

## 2018-11-03 NOTE — Telephone Encounter (Signed)
Pt having a lot pf pelvic pain and cramping since surgery. Pt requesting an appt with RPH when he returns to office next week. Please schedule

## 2018-11-03 NOTE — Telephone Encounter (Signed)
Currently no opening in schedule. Please advise work in

## 2018-11-04 ENCOUNTER — Encounter (HOSPITAL_COMMUNITY): Payer: Self-pay | Admitting: *Deleted

## 2018-11-04 ENCOUNTER — Other Ambulatory Visit: Payer: Self-pay

## 2018-11-04 ENCOUNTER — Emergency Department (HOSPITAL_COMMUNITY): Payer: Commercial Managed Care - PPO

## 2018-11-04 ENCOUNTER — Emergency Department (HOSPITAL_COMMUNITY)
Admission: EM | Admit: 2018-11-04 | Discharge: 2018-11-04 | Disposition: A | Discharge status: Home / Self Care | Payer: Commercial Managed Care - PPO | Attending: Emergency Medicine | Admitting: Emergency Medicine

## 2018-11-04 DIAGNOSIS — R109 Unspecified abdominal pain: Secondary | ICD-10-CM

## 2018-11-04 DIAGNOSIS — N939 Abnormal uterine and vaginal bleeding, unspecified: Secondary | ICD-10-CM | POA: Diagnosis not present

## 2018-11-04 DIAGNOSIS — Z90710 Acquired absence of both cervix and uterus: Secondary | ICD-10-CM | POA: Insufficient documentation

## 2018-11-04 DIAGNOSIS — R1030 Lower abdominal pain, unspecified: Secondary | ICD-10-CM | POA: Insufficient documentation

## 2018-11-04 LAB — URINALYSIS, ROUTINE W REFLEX MICROSCOPIC
Bilirubin Urine: NEGATIVE
Glucose, UA: NEGATIVE mg/dL
Ketones, ur: 80 mg/dL — AB
Nitrite: NEGATIVE
Protein, ur: NEGATIVE mg/dL
Specific Gravity, Urine: >1.046 — ABNORMAL HIGH (ref 1.005–1.030)
pH: 5 (ref 5.0–8.0)

## 2018-11-04 LAB — CBC WITH DIFFERENTIAL/PLATELET
Abs Immature Granulocytes: 0.05 10*3/uL (ref 0.00–0.07)
Basophils Absolute: 0 10*3/uL (ref 0.0–0.1)
Basophils Relative: 0 %
Eosinophils Absolute: 0.1 10*3/uL (ref 0.0–0.5)
Eosinophils Relative: 1 %
HCT: 45.4 % (ref 36.0–46.0)
Hemoglobin: 14.9 g/dL (ref 12.0–15.0)
Immature Granulocytes: 0 %
Lymphocytes Relative: 12 %
Lymphs Abs: 1.4 10*3/uL (ref 0.7–4.0)
MCH: 30.3 pg (ref 26.0–34.0)
MCHC: 32.8 g/dL (ref 30.0–36.0)
MCV: 92.5 fL (ref 80.0–100.0)
Monocytes Absolute: 0.9 10*3/uL (ref 0.1–1.0)
Monocytes Relative: 8 %
Neutro Abs: 9.2 10*3/uL — ABNORMAL HIGH (ref 1.7–7.7)
Neutrophils Relative %: 79 %
Platelets: 288 10*3/uL (ref 150–400)
RBC: 4.91 MIL/uL (ref 3.87–5.11)
RDW: 12.5 % (ref 11.5–15.5)
WBC: 11.8 10*3/uL — ABNORMAL HIGH (ref 4.0–10.5)
nRBC: 0 % (ref 0.0–0.2)

## 2018-11-04 LAB — BASIC METABOLIC PANEL
Anion gap: 10 (ref 5–15)
BUN: 9 mg/dL (ref 6–20)
CO2: 22 mmol/L (ref 22–32)
Calcium: 8.8 mg/dL — ABNORMAL LOW (ref 8.9–10.3)
Chloride: 106 mmol/L (ref 98–111)
Creatinine, Ser: 0.75 mg/dL (ref 0.44–1.00)
GFR calc Af Amer: >60 mL/min (ref >60)
GFR calc non Af Amer: >60 mL/min (ref >60)
Glucose, Bld: 88 mg/dL (ref 70–99)
Potassium: 3.5 mmol/L (ref 3.5–5.1)
Sodium: 138 mmol/L (ref 135–145)

## 2018-11-04 LAB — NOVEL CORONAVIRUS, NAA: SARS-CoV-2, NAA: NOT DETECTED

## 2018-11-04 MED ORDER — SODIUM CHLORIDE 0.9 % IV BOLUS
1000.0000 mL | Freq: Once | INTRAVENOUS | Status: AC
  Administered 2018-11-04: 13:00:00 1000 mL via INTRAVENOUS

## 2018-11-04 MED ORDER — FENTANYL CITRATE (PF) 100 MCG/2ML IJ SOLN
50.0000 ug | Freq: Once | INTRAMUSCULAR | Status: AC
  Administered 2018-11-04: 50 ug via INTRAVENOUS
  Filled 2018-11-04: qty 2

## 2018-11-04 MED ORDER — IOHEXOL 300 MG/ML  SOLN
100.0000 mL | Freq: Once | INTRAMUSCULAR | Status: AC | PRN
  Administered 2018-11-04: 100 mL via INTRAVENOUS

## 2018-11-04 MED ORDER — FENTANYL CITRATE (PF) 100 MCG/2ML IJ SOLN
50.0000 ug | Freq: Once | INTRAMUSCULAR | Status: AC
  Administered 2018-11-04: 13:00:00 50 ug via INTRAVENOUS
  Filled 2018-11-04: qty 2

## 2018-11-04 NOTE — ED Triage Notes (Signed)
Pt had a laparoscopic hysterectomy 8 weeks ago. Pt reports she was getting out of the bathtub on Thursday and felt a "pop". Pt reports she began having heavy vaginal bleeding right afterwards. Pt reports last night dizziness started. Pt reports she called her surgeon and they told her to come to ED for evaluation.

## 2018-11-04 NOTE — ED Provider Notes (Addendum)
[facility] Provider Note   CSN: [id number] Arrival date & time: 11/04/18  1100     History   Chief Complaint Chief Complaint  Patient presents with   Abdominal Pain   Weakness    HPI Stephanie Hayden is a 28 y.o. female.     Chief complaint lower abdominal pain for 2 days.  Status post laparoscopic hysterectomy with bilateral salpingoectomy for endometriosis on 09/07/2018 by Dr. Teresa Coombs in [place].  Normal right ovary was noted and absent left ovary.  She had been doing well.  Pain started when she was getting out of the bathtub.  Some vaginal bleeding followed this incident, but has improved.  Now with generalized feeling of malaise, low energy, lower abdominal pain.  Severity is moderate.  Palpation makes pain worse.     Past Medical History:  Diagnosis Date   Complication of anesthesia    difficulty waking up   Endometriosis    Family history of adverse reaction to anesthesia    mother also diffucult to wake up after    Family history of breast cancer    genetic testing letter sent 5/18   GERD (gastroesophageal reflux disease)    ONLY WHEN PREGNANT   Phlebitis following infusion    LEFT ARM-AFTER HAVING WISDOME TEETH TAKEN OUT IN APRIL 2017   Urinary tract infection     Patient Active Problem List   Diagnosis Date Noted   S/P laparoscopic hysterectomy 09/15/2018   Pelvic pain [id number]   Dysmetabolic syndrome [id number]   Over weight 04/10/2018   Obesity (BMI 30-39.9) 02/08/2018   Syncope 06/22/2017   History of cesarean section 02/01/2017   Dysmenorrhea 09/28/2016   Headache syndrome 01/19/2016   Endometriosis 06/06/2015    Past Surgical History:  Procedure Laterality Date   ABDOMINAL HYSTERECTOMY     ABLATION ON ENDOMETRIOSIS  2017   CESAREAN SECTION  2011   CESAREAN SECTION WITH BILATERAL TUBAL LIGATION N/A 07/05/2017   Procedure: CESAREAN SECTION WITH BILATERAL TUBAL LIGATION;  Surgeon: Will Bonnet, MD;  Location: [facility] ORS;  Service: Obstetrics;  Laterality: N/A;   CHROMOPERTUBATION N/A 06/06/2015   Procedure: CHROMOPERTUBATION;  Surgeon: Gae Dry, MD;  Location: [facility] ORS;  Service: Gynecology;  Laterality: N/A;   LAPAROSCOPY N/A 06/06/2015   Procedure: LAPAROSCOPY OPERATIVE/ EXCISION OF ENDOMETRIOSIS;  Surgeon: Gae Dry, MD;  Location: [facility] ORS;  Service: Gynecology;  Laterality: N/A;   TONSILLECTOMY     TOTAL LAPAROSCOPIC HYSTERECTOMY WITH BILATERAL SALPINGO OOPHORECTOMY N/A 09/07/2018   Procedure: TOTAL LAPAROSCOPIC HYSTERECTOMY WITH BILATERAL SALPINGO;  Surgeon: Gae Dry, MD;  Location: [facility] ORS;  Service: Gynecology;  Laterality: N/A;   TUBAL LIGATION     WISDOM TOOTH EXTRACTION  04-2015     OB History    Gravida  2   Para  2   Term  2   Preterm      AB      Living  2     SAB      TAB      Ectopic      Multiple  0   Live Births  2            Home Medications    Prior to Admission medications   Medication Sig Start Date End Date Taking? Authorizing Provider  acetaminophen (TYLENOL) 325 MG tablet Take 650 mg by mouth every 6 (six) hours as needed (for pain.).    [provider]  phentermine (ADIPEX-P)  37.5 MG tablet Take 1 tablet (37.5 mg total) by mouth daily before breakfast. 10/23/18   Gae Dry, MD    Family History Family History  Problem Relation Age of Onset   Hypertension Mother    Diabetes Mother    Migraines Mother    Heart failure Mother        Coded during delivery of second child   Hypertension Father    Diabetes Father    Heart attack Father 3   Brain cancer Paternal Grandfather    Brain cancer Cousin    Breast cancer Paternal Aunt 45   Breast cancer Paternal Aunt 82   Breast cancer Paternal Aunt 66    Social History Social History   Tobacco Use   Smoking status: Never Smoker   Smokeless tobacco: Never Used  Substance Use Topics   Alcohol use: No   Drug  use: No     Allergies   Coconut flavor and Morphine and related   Review of Systems Review of Systems  All other systems reviewed and are negative.    Physical Exam Updated Vital Signs BP 112/75 (BP Location: Left Arm)    Pulse 73    Temp 98.6 F (37 C) (Oral)    Resp 16    Ht 5\' 7"  (1.702 m)    Wt 74.8 kg    LMP 08/21/2018    SpO2 100%    BMI 25.84 kg/m   Physical Exam Vitals signs and nursing note reviewed.  Constitutional:      Appearance: She is well-developed.  HENT:     Head: Normocephalic and atraumatic.  Eyes:     Conjunctiva/sclera: Conjunctivae normal.  Neck:     Musculoskeletal: Neck supple.  Cardiovascular:     Rate and Rhythm: Normal rate and regular rhythm.  Pulmonary:     Effort: Pulmonary effort is normal.     Breath sounds: Normal breath sounds.  Abdominal:     General: Bowel sounds are normal.     Palpations: Abdomen is soft.     Comments: Tender lower abdomen.  Genitourinary:    Comments: Pelvic exam: Slight yellowish-green discharge.  Cervical cuff is tender but intact.  No obvious masses. Musculoskeletal: Normal range of motion.  Skin:    General: Skin is warm and dry.  Neurological:     General: No focal deficit present.     Mental Status: She is alert and oriented to person, place, and time.  Psychiatric:        Behavior: Behavior normal.      ED Treatments / Results  Labs (all labs ordered are listed, but only abnormal results are displayed) Labs Reviewed  CBC WITH DIFFERENTIAL/PLATELET - Abnormal; Notable for the following components:      Result Value   WBC 11.8 (*)    Neutro Abs 9.2 (*)    All other components within normal limits  BASIC METABOLIC PANEL - Abnormal; Notable for the following components:   Calcium 8.8 (*)    All other components within normal limits  URINALYSIS, ROUTINE W REFLEX MICROSCOPIC - Abnormal; Notable for the following components:   APPearance HAZY (*)    Specific Gravity, Urine >1.046 (*)    Hgb  urine dipstick MODERATE (*)    Ketones, ur 80 (*)    Leukocytes,Ua TRACE (*)    Bacteria, UA RARE (*)    All other components within normal limits  AEROBIC CULTURE (SUPERFICIAL SPECIMEN)  URINE CULTURE    EKG None  Radiology Ct Abdomen Pelvis W Contrast  Result Date: 11/04/2018 CLINICAL DATA:  Abdominal pain. History of laparoscopic hysterectomy 09/07/2018. EXAM: CT ABDOMEN AND PELVIS WITH CONTRAST TECHNIQUE: Multidetector CT imaging of the abdomen and pelvis was performed using the standard protocol following bolus administration of intravenous contrast. CONTRAST:  157mL OMNIPAQUE IOHEXOL 300 MG/ML  SOLN COMPARISON:  09/11/2018 FINDINGS: Lower chest: The lung bases are clear of acute process. No pleural effusion or pulmonary lesions. The heart is normal in size. No pericardial effusion. The distal esophagus and aorta are unremarkable. Hepatobiliary: Small low-attenuation lesion in the left hepatic lobe, likely benign cyst. No worrisome hepatic lesions or intrahepatic biliary dilatation. The gallbladder appears normal. No common bile duct dilatation. Pancreas: No mass, inflammation or ductal dilatation. Spleen: Normal size.  No focal lesions. Adrenals/Urinary Tract: The adrenal glands and kidneys are unremarkable. The bladder is unremarkable. Stomach/Bowel: The stomach, duodenum, small bowel and colon are unremarkable. No acute inflammatory changes, mass lesions or obstructive findings. Vascular/Lymphatic: The aorta is normal in caliber. No dissection. The branch vessels are patent. The major venous structures are patent. No mesenteric or retroperitoneal mass or adenopathy. Small scattered lymph nodes are noted. Reproductive: Status post hysterectomy. The right ovary is still present and appears normal. There is a small rim enhancing cyst noted. The left ovary is not identified for certain. Mild fullness in the vaginal fornices with prominent vasculature but no postop hematoma or active bleeding.  Other: Small amount of free pelvic fluid and interstitial changes in the sigmoid mesocolon. I do not see any definite findings for diverticulitis. The appendix is normal. Musculoskeletal: No significant bony findings. IMPRESSION: 1. Status post hysterectomy and probable left oophorectomy. The right ovary is still present and appears normal. 2. Mild interstitial/inflammatory changes in the pelvis and a small amount of free pelvic fluid. This may all be postoperative change. I do not see any active bleeding or large hematoma. 3. No other significant abdominal/pelvic findings. No mass lesions or adenopathy. Electronically Signed   By: Marijo Sanes M.D.   On: 11/04/2018 14:44    Procedures Procedures (including critical care time)  Medications Ordered in ED Medications  sodium chloride 0.9 % bolus 1,000 mL (0 mLs Intravenous Stopped 11/04/18 1351)  fentaNYL (SUBLIMAZE) injection 50 mcg (50 mcg Intravenous Given 11/04/18 1248)  iohexol (OMNIPAQUE) 300 MG/ML solution 100 mL (100 mLs Intravenous Contrast Given 11/04/18 1357)  fentaNYL (SUBLIMAZE) injection 50 mcg (50 mcg Intravenous Given 11/04/18 1502)     Initial Impression / Assessment and Plan / ED Course  I have reviewed the triage vital signs and the nursing notes.  Pertinent labs & imaging results that were available during my care of the patient were reviewed by me and considered in my medical decision making (see chart for details).  Clinical Course as of Nov 05 1031  Sat Nov 04, 2018  1557 Pt signed out to me by Dr. Lacinda Axon.  Briefly 28 yo female w/ hx of lap hysterectomy with left salpingectomy on 09/07/2018 with Dr Kenton Kingfisher in [place].  Began having abdominal pain a few days ago, with vaginal bleeding and persistent pain.  CT scan performed, no acute infectious finding.  Dr Lacinda Axon spoke to her OBGYN surgeon who recommended follow up as outpatient. Pending UA, and anticipate discharge afterwards   [MT]  1638 UA unconvincing for UTI, with 11-20  equamous cells, no nitrites.  Will send for culture but will not start treatment at this time.  Patient informed and will discharge   [MT]  Clinical Course User Index [MT] Wyvonnia Dusky, MD       We will obtain CT abdomen pelvis, pelvic exam, pain management, cervical cx.  Patient reexamined several times.  Discussed care with OB/GYN on-call in [facility] [Dr [place], [facility] on-call in [place] [Dr Ervin], and radiologist.  Uncertain etiology of patient's persistent pain.  No red flags were identified for an acute abdomen.  Could be related to endometriosis flareup, ruptured right ovarian cyst, postoperative changes.  Urinalysis pending at time of dictation.  Discussed with my colleague Dr. Langston Masker who will make final disposition.   11/05/18 1030:   Spoke with patient on the telephone.  Discussed case.  She reported a yellowish-green discharge in her underwear.  I called in a prescription for Cipro 500 mg twice a day for 1 week to the [facility] for presumptive UTI (from yesterday's labs).  I am also concerned about her discharge.  She will call the OB/GYN doctor on call for Dr. Kenton Kingfisher in [place] today to discuss her clinical condition further.  Patient understands and agrees. Final Clinical Impressions(s) / ED Diagnoses   Final diagnoses:  Abdominal pain, unspecified abdominal location    ED Discharge Orders    None       Nat Christen, MD 11/04/18 1253    Nat Christen, MD 11/04/18 1401    Nat Christen, MD 11/04/18 1610    Nat Christen, MD 11/05/18 [id number]    Nat Christen, MD 11/05/18 [phone]

## 2018-11-04 NOTE — ED Notes (Signed)
Pt also reports dizzy and headache  Hysterectomy was 8 weeks ago, the tearing of abd happened Thursday  Her surgeon is in [place]  Reports she was told she had a rigid abd at Urgent Care and told to come here

## 2018-11-04 NOTE — ED Notes (Signed)
Urine to lab

## 2018-11-04 NOTE — ED Notes (Signed)
Lab collected by lab

## 2018-11-04 NOTE — ED Notes (Signed)
Dr C in to reassess

## 2018-11-04 NOTE — ED Notes (Signed)
Recent hysterectomy  Getting out of tub felt tear Vaginal bleeding  Now scant blood But continued discomfort   Went to Urgent care who sent here Pt called surgeon who suggested eval

## 2018-11-04 NOTE — ED Notes (Signed)
To rad 

## 2018-11-04 NOTE — Discharge Instructions (Addendum)
Tests showed no obvious life-threatening conditions.  I discussed your care with the doctor on call for Dr. Teresa Coombs.  Recommend following up in [place] if symptoms do not improve.

## 2018-11-05 ENCOUNTER — Other Ambulatory Visit: Payer: Self-pay | Admitting: Obstetrics and Gynecology

## 2018-11-05 ENCOUNTER — Telehealth: Payer: Self-pay | Admitting: Obstetrics and Gynecology

## 2018-11-05 DIAGNOSIS — Z90710 Acquired absence of both cervix and uterus: Secondary | ICD-10-CM

## 2018-11-05 NOTE — Telephone Encounter (Signed)
Stephanie Hayden called the nurse line. She was called by Dr. Lacinda Axon this morning at [facility] who told her the preliminary urine culture showed an infection. He recommended she contact me for further care and follow up.   Stephanie Hayden reports that Thursday while getting out of the tub she had a tearing sensation. She had some vaginal bleeding and now a yellow green discharge. She feels a constant menstrual cramp pain in her abdomen. She has pain with urination. She is starting to feel nauseous. She felt feverish, but after tylenol her temperature was 99.7, she did not take it before taking tylenol.   Dr. Lacinda Axon called her in an antibiotic for clindamycin 500 mg BID for 7 days. She has not started taking this medication yet.  She is hesitant to come to the ER because she is in severe pain and can not imagine waiting to be seen because of her pain. She would prefer to be seen tomorrow in office and a plan was made for her to follow up at 11:30 tomorrow with Dr. Kenton Kingfisher. She will pick up antibiotic and start taking it. I added a prescription for uribel and tramadol. Instructed her to place a tampon and leave in place for 4-6 hours after taking uribel. If tampon has stool or blue from the uribel we would like to know this and it could be suggestive of a fistula. If her symptoms worsen, if she is febrile, if she can not keep down liquids, or for any additional concern she will come promptly to the ER.   Adrian Prows MD [facility], [facility] 11/05/2018 12:43 PM

## 2018-11-06 ENCOUNTER — Other Ambulatory Visit (HOSPITAL_COMMUNITY)
Admission: RE | Admit: 2018-11-06 | Discharge: 2018-11-06 | Disposition: A | Discharge status: Home / Self Care | Payer: Commercial Managed Care - PPO | Source: Ambulatory Visit | Attending: Obstetrics & Gynecology | Admitting: Obstetrics & Gynecology

## 2018-11-06 ENCOUNTER — Other Ambulatory Visit: Payer: Self-pay

## 2018-11-06 ENCOUNTER — Ambulatory Visit (INDEPENDENT_AMBULATORY_CARE_PROVIDER_SITE_OTHER): Payer: Commercial Managed Care - PPO | Admitting: Obstetrics & Gynecology

## 2018-11-06 ENCOUNTER — Encounter: Payer: Self-pay | Admitting: Obstetrics & Gynecology

## 2018-11-06 VITALS — BP 120/80 | Ht 67.0 in | Wt 176.0 lb

## 2018-11-06 DIAGNOSIS — N898 Other specified noninflammatory disorders of vagina: Secondary | ICD-10-CM | POA: Insufficient documentation

## 2018-11-06 DIAGNOSIS — R102 Pelvic and perineal pain: Secondary | ICD-10-CM

## 2018-11-06 MED ORDER — HYDROCODONE-ACETAMINOPHEN 5-325 MG PO TABS: 1.0000 | ORAL_TABLET | Freq: Four times a day (QID) | ORAL | 0 refills | Status: DC | PRN

## 2018-11-06 MED ORDER — METRONIDAZOLE 500 MG PO TABS: 500.0000 mg | ORAL_TABLET | Freq: Two times a day (BID) | ORAL | 0 refills | Status: DC

## 2018-11-06 NOTE — Progress Notes (Signed)
Gynecology Pelvic Pain Evaluation   Chief Complaint:  Chief Complaint  Patient presents with  . Post-op Problem  . Pelvic Pain    History of Present Illness:   Patient is a 28 y.o. [id number] who LMP was Patient's last menstrual period was 08/21/2018., presents today for a problem visit.  She complains of pain.   Her pain is localized to the deep pelvis and vagina area, described as sharp and stabbing, began Thursday as she got out of the bath tub, feeling a pop.  She had TLH 8 weeks ago and had been healing well.  No pain prior to this..  Has been sexually active without pain or bleeding, last episode one week ago.  Thurs she felt pop and severe pain, laid in bed til saturday. ER saturday, UTI but nirmal CT and exam.  She does also report yellow vag disharge, she had some bleeding Thurs-Fri, none since.   IBF and Tylenol help a little.  PMHx: She  has a past medical history of Complication of anesthesia, Endometriosis, Family history of adverse reaction to anesthesia, Family history of breast cancer, GERD (gastroesophageal reflux disease), Phlebitis following infusion, and Urinary tract infection. Also,  has a past surgical history that includes Cesarean section (2011); Tonsillectomy; Wisdom tooth extraction (04-2015); laparoscopy (N/A, 06/06/2015); Chromopertubation (N/A, 06/06/2015); Ablation on endometriosis (2017); Cesarean section with bilateral tubal ligation (N/A, 07/05/2017); Tubal ligation; Total laparoscopic hysterectomy with bilateral salpingo oophorectomy (N/A, 09/07/2018); and Abdominal hysterectomy., family history includes Brain cancer in her cousin and paternal grandfather; Breast cancer (age of onset: 54) in her paternal aunt, paternal aunt, and paternal aunt; Diabetes in her father and mother; Heart attack (age of onset: 35) in her father; Heart failure in her mother; Hypertension in her father and mother; Migraines in her mother.,  reports that she has never smoked. She has never used  smokeless tobacco. She reports that she does not drink alcohol or use drugs.  She has a current medication list which includes the following prescription(s): ciprofloxacin, acetaminophen, hydrocodone-acetaminophen, metronidazole, and phentermine. Also, is allergic to coconut flavor and morphine and related.  Review of Systems  Constitutional: Negative for chills, fever and malaise/fatigue.  HENT: Negative for congestion, sinus pain and sore throat.   Eyes: Negative for blurred vision and pain.  Respiratory: Negative for cough and wheezing.   Cardiovascular: Negative for chest pain and leg swelling.  Gastrointestinal: Negative for abdominal pain, constipation, diarrhea, heartburn, nausea and vomiting.  Genitourinary: Negative for dysuria, frequency, hematuria and urgency.  Musculoskeletal: Negative for back pain, joint pain, myalgias and neck pain.  Skin: Negative for itching and rash.  Neurological: Negative for dizziness, tremors and weakness.  Endo/Heme/Allergies: Does not bruise/bleed easily.  Psychiatric/Behavioral: Negative for depression. The patient is not nervous/anxious and does not have insomnia.     Objective: BP 120/80   Ht 5\' 7"  (1.702 m)   Wt 176 lb (79.8 kg)   LMP 08/21/2018   BMI 27.57 kg/m  Physical Exam Constitutional:      General: She is not in acute distress.    Appearance: She is well-developed.  Genitourinary:     Pelvic exam was performed with patient supine.     Vagina normal.     No vaginal erythema or bleeding.     Genitourinary Comments: Cuff intact/ no lesions Absent uterus and cervix Pain on exam Yellow discharge  HENT:     Head: Normocephalic and atraumatic.     Nose: Nose normal.  Abdominal:  General: There is no distension.     Palpations: Abdomen is soft.     Tenderness: There is no abdominal tenderness.  Musculoskeletal: Normal range of motion.  Neurological:     Mental Status: She is alert and oriented to person, place, and time.      Cranial Nerves: No cranial nerve deficit.  Skin:    General: Skin is warm and dry.  Psychiatric:        Attention and Perception: Attention normal.        Mood and Affect: Mood normal.        Speech: Speech normal.        Behavior: Behavior normal.        Cognition and Memory: Cognition normal.        Judgment: Judgment normal.   CT ABDOMEN AND PELVIS WITH CONTRAST 11/04/2018  TECHNIQUE: Multidetector CT imaging of the abdomen and pelvis was performed using the standard protocol following bolus administration of intravenous contrast.  CONTRAST:  135mL OMNIPAQUE IOHEXOL 300 MG/ML  SOLN  COMPARISON:  09/11/2018  FINDINGS: Lower chest: The lung bases are clear of acute process. No pleural effusion or pulmonary lesions. The heart is normal in size. No pericardial effusion. The distal esophagus and aorta are unremarkable.  Hepatobiliary: Small low-attenuation lesion in the left hepatic lobe, likely benign cyst. No worrisome hepatic lesions or intrahepatic biliary dilatation. The gallbladder appears normal. No common bile duct dilatation.  Pancreas: No mass, inflammation or ductal dilatation.  Spleen: Normal size.  No focal lesions.  Adrenals/Urinary Tract: The adrenal glands and kidneys are unremarkable. The bladder is unremarkable.  Stomach/Bowel: The stomach, duodenum, small bowel and colon are unremarkable. No acute inflammatory changes, mass lesions or obstructive findings.  Vascular/Lymphatic: The aorta is normal in caliber. No dissection. The branch vessels are patent. The major venous structures are patent. No mesenteric or retroperitoneal mass or adenopathy. Small scattered lymph nodes are noted.  Reproductive: Status post hysterectomy. The right ovary is still present and appears normal. There is a small rim enhancing cyst noted. The left ovary is not identified for certain. Mild fullness in the vaginal fornices with prominent vasculature but no  postop hematoma or active bleeding.  Other: Small amount of free pelvic fluid and interstitial changes in the sigmoid mesocolon. I do not see any definite findings for diverticulitis. The appendix is normal.  Musculoskeletal: No significant bony findings.  IMPRESSION: 1. Status post hysterectomy and probable left oophorectomy. The right ovary is still present and appears normal. 2. Mild interstitial/inflammatory changes in the pelvis and a small amount of free pelvic fluid. This may all be postoperative change. I do not see any active bleeding or large hematoma. 3. No other significant abdominal/pelvic findings. No mass lesions or adenopathy.   Electronically Signed   By: Marijo Sanes M.D.   On: 11/04/2018 14:44  Results for orders placed or performed during the hospital encounter of 11/04/18  Wound or Superficial Culture   Specimen: Vaginal; Wound  Result Value Ref Range   Specimen Description      VAGINA Performed at [facility], [address] [place], [place] [address]    Special Requests NONE    Gram Stain      ABUNDANT GRAM NEGATIVE RODS ABUNDANT GRAM POSITIVE RODS ABUNDANT GRAM POSITIVE COCCI    Culture      CULTURE REINCUBATED FOR BETTER GROWTH Performed at [facility], [place] [address], [place], [place] [address]    Report Status PENDING  Urine culture   Specimen: Urine, Clean Catch  Result Value Ref Range   Specimen Description      URINE, CLEAN CATCH Performed at [facility], [address], [place], [place] [address]    Special Requests      NONE Performed at [facility], [address], [place], [place] [address]    Culture (A)     20,000 COLONIES/mL ENTEROBACTER AEROGENES SUSCEPTIBILITIES TO FOLLOW Performed at [facility] [address] [place], [place] [address]    Report Status PENDING   CBC with Differential  Result Value Ref Range   WBC 11.8 (H) 4.0 - 10.5 K/uL   RBC 4.91 3.87 - 5.11 MIL/uL   Hemoglobin 14.9 12.0  - 15.0 g/dL   HCT 45.4 36.0 - 46.0 %   MCV 92.5 80.0 - 100.0 fL   MCH 30.3 26.0 - 34.0 pg   MCHC 32.8 30.0 - 36.0 g/dL   RDW 12.5 11.5 - 15.5 %   Platelets 288 150 - 400 K/uL   nRBC 0.0 0.0 - 0.2 %   Neutrophils Relative % 79 %   Neutro Abs 9.2 (H) 1.7 - 7.7 K/uL   Lymphocytes Relative 12 %   Lymphs Abs 1.4 0.7 - 4.0 K/uL   Monocytes Relative 8 %   Monocytes Absolute 0.9 0.1 - 1.0 K/uL   Eosinophils Relative 1 %   Eosinophils Absolute 0.1 0.0 - 0.5 K/uL   Basophils Relative 0 %   Basophils Absolute 0.0 0.0 - 0.1 K/uL   Immature Granulocytes 0 %   Abs Immature Granulocytes 0.05 0.00 - 0.07 K/uL  Basic metabolic panel  Result Value Ref Range   Sodium 138 135 - 145 mmol/L   Potassium 3.5 3.5 - 5.1 mmol/L   Chloride 106 98 - 111 mmol/L   CO2 22 22 - 32 mmol/L   Glucose, Bld 88 70 - 99 mg/dL   BUN 9 6 - 20 mg/dL   Creatinine, Ser 0.75 0.44 - 1.00 mg/dL   Calcium 8.8 (L) 8.9 - 10.3 mg/dL   GFR calc non Af Amer >60 >60 mL/min   GFR calc Af Amer >60 >60 mL/min   Anion gap 10 5 - 15  Urinalysis, Routine w reflex microscopic  Result Value Ref Range   Color, Urine YELLOW YELLOW   APPearance HAZY (A) CLEAR   Specific Gravity, Urine >1.046 (H) 1.005 - 1.030   pH 5.0 5.0 - 8.0   Glucose, UA NEGATIVE NEGATIVE mg/dL   Hgb urine dipstick MODERATE (A) NEGATIVE   Bilirubin Urine NEGATIVE NEGATIVE   Ketones, ur 80 (A) NEGATIVE mg/dL   Protein, ur NEGATIVE NEGATIVE mg/dL   Nitrite NEGATIVE NEGATIVE   Leukocytes,Ua TRACE (A) NEGATIVE   RBC / HPF 21-50 0 - 5 RBC/hpf   WBC, UA 11-20 0 - 5 WBC/hpf   Bacteria, UA RARE (A) NONE SEEN   Squamous Epithelial / LPF 11-20 0 - 5   Mucus PRESENT     Assessment: 28 y.o. [id number] with Pain and Discharge.  1. Vaginal discharge - Cervicovaginal ancillary only  2. Pelvic pain - ABX and pain meds, monitor for response No S/SX dehiscence, fistula, abscess   Barnett Applebaum, MD, [place], [facility] 11/06/2018  11:38 AM

## 2018-11-06 NOTE — Telephone Encounter (Signed)
Patient aware of appointment date and time. 

## 2018-11-06 NOTE — Telephone Encounter (Signed)
Work in today [employer]

## 2018-11-07 LAB — AEROBIC CULTURE W GRAM STAIN (SUPERFICIAL SPECIMEN): Culture: NORMAL

## 2018-11-07 LAB — URINE CULTURE: Culture: 20000 — AB

## 2018-11-08 ENCOUNTER — Telehealth: Payer: Self-pay | Admitting: Emergency Medicine

## 2018-11-08 LAB — CERVICOVAGINAL ANCILLARY ONLY
Bacterial Vaginitis (gardnerella): POSITIVE — AB
Candida Glabrata: NEGATIVE
Candida Vaginitis: NEGATIVE
Chlamydia: NEGATIVE
Comment: NEGATIVE
Comment: NEGATIVE
Comment: NEGATIVE
Comment: NEGATIVE
Comment: NEGATIVE
Comment: NORMAL
Neisseria Gonorrhea: NEGATIVE
Trichomonas: NEGATIVE

## 2018-11-08 NOTE — Telephone Encounter (Signed)
Post ED Visit - Positive Culture Follow-up  Culture report reviewed by antimicrobial stewardship pharmacist: [facility] Team []  Elenor Quinones, Pharm.D. []  Heide Guile, Pharm.D., BCPS AQ-ID []  Parks Neptune, Pharm.D., BCPS []  Alycia Rossetti, Pharm.D., BCPS []  [place], Pharm.D., BCPS, AAHIVP []  Legrand Como, Pharm.D., BCPS, AAHIVP []  Salome Arnt, PharmD, BCPS []  Johnnette Gourd, PharmD, BCPS []  Hughes Better, PharmD, BCPS []  Leeroy Cha, PharmD []  Laqueta Linden, PharmD, BCPS []  Albertina Parr, PharmD  [place] Team []  Leodis Sias, PharmD []  Lindell Spar, PharmD []  Royetta Asal, PharmD []  Graylin Shiver, Rph []  Rema Fendt) Glennon Mac, PharmD []  Arlyn Dunning, PharmD []  Netta Cedars, PharmD []  Dia Sitter, PharmD []  Leone Haven, PharmD []  Gretta Arab, PharmD []  Theodis Shove, PharmD []  Peggyann Juba, PharmD []  Reuel Boom, PharmD   Positive wound culture Treated with cipro, organism sensitive to the same and no further patient follow-up is required at this time.  Hazle Nordmann 11/08/2018, 4:54 PM

## 2018-11-30 ENCOUNTER — Encounter (HOSPITAL_COMMUNITY): Payer: Self-pay | Admitting: *Deleted

## 2018-11-30 ENCOUNTER — Emergency Department (HOSPITAL_COMMUNITY)
Admission: EM | Admit: 2018-11-30 | Discharge: 2018-11-30 | Disposition: A | Discharge status: Home / Self Care | Payer: Commercial Managed Care - PPO | Attending: Emergency Medicine | Admitting: Emergency Medicine

## 2018-11-30 DIAGNOSIS — S61217A Laceration without foreign body of left little finger without damage to nail, initial encounter: Secondary | ICD-10-CM | POA: Diagnosis present

## 2018-11-30 DIAGNOSIS — W458XXA Other foreign body or object entering through skin, initial encounter: Secondary | ICD-10-CM | POA: Diagnosis not present

## 2018-11-30 DIAGNOSIS — Y929 Unspecified place or not applicable: Secondary | ICD-10-CM | POA: Insufficient documentation

## 2018-11-30 DIAGNOSIS — Z23 Encounter for immunization: Secondary | ICD-10-CM | POA: Diagnosis not present

## 2018-11-30 DIAGNOSIS — Y999 Unspecified external cause status: Secondary | ICD-10-CM | POA: Diagnosis not present

## 2018-11-30 DIAGNOSIS — Y93G1 Activity, food preparation and clean up: Secondary | ICD-10-CM | POA: Insufficient documentation

## 2018-11-30 MED ORDER — POVIDONE-IODINE 10 % EX SOLN
CUTANEOUS | Status: DC | PRN
  Administered 2018-11-30: 12:00:00 via TOPICAL
  Filled 2018-11-30 (×2): qty 15

## 2018-11-30 MED ORDER — TETANUS-DIPHTH-ACELL PERTUSSIS 5-2.5-18.5 LF-MCG/0.5 IM SUSP
0.5000 mL | Freq: Once | INTRAMUSCULAR | Status: AC
  Administered 2018-11-30: 12:00:00 0.5 mL via INTRAMUSCULAR
  Filled 2018-11-30: qty 0.5

## 2018-11-30 MED ORDER — LIDOCAINE HCL (PF) 1 % IJ SOLN
5.0000 mL | Freq: Once | INTRAMUSCULAR | Status: AC
  Administered 2018-11-30: 12:00:00 5 mL
  Filled 2018-11-30: qty 6

## 2018-11-30 MED ORDER — CEPHALEXIN 500 MG PO CAPS: 500.0000 mg | ORAL_CAPSULE | Freq: Two times a day (BID) | ORAL | 0 refills | Status: DC

## 2018-11-30 NOTE — Discharge Instructions (Signed)
Have your sutures removed in 10 days.  Keep your wound clean and dry,  until a good scab forms - you may then wash gently twice daily with mild soap and water, but dry completely after.  Use either a bulky dressing or a finger splint to protect the wound and minimize finger flexion as this heals. Get rechecked for any sign of infection (redness,  Swelling,  Increased pain or drainage of purulent fluid).  There is no obvious tendon injury as you can move all of the phalanxes of this finger without decreased strength or movement.  However, if you have any ongoing problems, Dr. Caralyn Guile is our hand specialist if needed.  You may also contact Dr. Aline Brochure if you prefer to keep your care local.

## 2018-11-30 NOTE — ED Provider Notes (Signed)
[facility] EMERGENCY DEPARTMENT Provider Note   CSN: [id number] Arrival date & time: 11/30/18  1121     History   Chief Complaint Chief Complaint  Patient presents with  . Extremity Laceration    HPI ELIYA WILT is a 28 y.o. female with no significant past medical history presenting with laceration to her left fifth finger occurring prior to arrival while attempting to open up a can of green beans.  Patient is right-handed.  She denies weakness or numbness in the injured digit.  There has been moderate bleeding from the site but is controlled with pressure application.  Her last tetanus was more than 10 years ago.     The history is provided by the patient.    Past Medical History:  Diagnosis Date  . Complication of anesthesia    difficulty waking up  . Endometriosis   . Family history of adverse reaction to anesthesia    mother also diffucult to wake up after   . Family history of breast cancer    genetic testing letter sent 5/18  . GERD (gastroesophageal reflux disease)    ONLY WHEN PREGNANT  . Phlebitis following infusion    LEFT ARM-AFTER HAVING WISDOME TEETH TAKEN OUT IN APRIL 2017  . Urinary tract infection     Patient Active Problem List   Diagnosis Date Noted  . S/P laparoscopic hysterectomy 09/15/2018  . Pelvic pain 08/29/2018  . Dysmetabolic syndrome [id number]  . Over weight 04/10/2018  . Obesity (BMI 30-39.9) 02/08/2018  . Syncope 06/22/2017  . History of cesarean section 02/01/2017  . Dysmenorrhea 09/28/2016  . Headache syndrome 01/19/2016  . Endometriosis 06/06/2015    Past Surgical History:  Procedure Laterality Date  . ABDOMINAL HYSTERECTOMY    . ABLATION ON ENDOMETRIOSIS  2017  . CESAREAN SECTION  2011  . CESAREAN SECTION WITH BILATERAL TUBAL LIGATION N/A 07/05/2017   Procedure: CESAREAN SECTION WITH BILATERAL TUBAL LIGATION;  Surgeon: Will Bonnet, MD;  Location: [facility] ORS;  Service: Obstetrics;  Laterality: N/A;  .  CHROMOPERTUBATION N/A 06/06/2015   Procedure: CHROMOPERTUBATION;  Surgeon: Gae Dry, MD;  Location: [facility] ORS;  Service: Gynecology;  Laterality: N/A;  . LAPAROSCOPY N/A 06/06/2015   Procedure: LAPAROSCOPY OPERATIVE/ EXCISION OF ENDOMETRIOSIS;  Surgeon: Gae Dry, MD;  Location: [facility] ORS;  Service: Gynecology;  Laterality: N/A;  . TONSILLECTOMY    . TOTAL LAPAROSCOPIC HYSTERECTOMY WITH BILATERAL SALPINGO OOPHORECTOMY N/A 09/07/2018   Procedure: TOTAL LAPAROSCOPIC HYSTERECTOMY WITH BILATERAL SALPINGO;  Surgeon: Gae Dry, MD;  Location: [facility] ORS;  Service: Gynecology;  Laterality: N/A;  . TUBAL LIGATION    . WISDOM TOOTH EXTRACTION  04-2015     OB History    Gravida  2   Para  2   Term  2   Preterm      AB      Living  2     SAB      TAB      Ectopic      Multiple  0   Live Births  2            Home Medications    Prior to Admission medications   Medication Sig Start Date End Date Taking? Authorizing Provider  acetaminophen (TYLENOL) 325 MG tablet Take 650 mg by mouth every 6 (six) hours as needed (for pain.).    [provider]  cephALEXin (KEFLEX) 500 MG capsule Take 1 capsule (500 mg total) by mouth 2 (two) times  daily. 11/30/18   Evalee Jefferson, PA-C  ciprofloxacin (CIPRO) 500 MG tablet Take 500 mg by mouth 2 (two) times daily. 11/05/18   [provider]  HYDROcodone-acetaminophen (NORCO) 5-325 MG tablet Take 1 tablet by mouth every 6 (six) hours as needed for moderate pain. Take 2 pills One Hour Prior to Procedure 11/06/18   Gae Dry, MD  metroNIDAZOLE (FLAGYL) 500 MG tablet Take 1 tablet (500 mg total) by mouth 2 (two) times daily. 11/06/18   Gae Dry, MD  phentermine (ADIPEX-P) 37.5 MG tablet Take 1 tablet (37.5 mg total) by mouth daily before breakfast. Patient not taking: Reported on 11/06/2018 10/23/18   Gae Dry, MD    Family History Family History  Problem Relation Age of Onset  . Hypertension Mother    . Diabetes Mother   . Migraines Mother   . Heart failure Mother        Coded during delivery of second child  . Hypertension Father   . Diabetes Father   . Heart attack Father 2  . Brain cancer Paternal Grandfather   . Brain cancer Cousin   . Breast cancer Paternal Aunt 33  . Breast cancer Paternal Aunt 22  . Breast cancer Paternal Aunt 68    Social History Social History   Tobacco Use  . Smoking status: Never Smoker  . Smokeless tobacco: Never Used  Substance Use Topics  . Alcohol use: No  . Drug use: No     Allergies   Coconut flavor and Morphine and related   Review of Systems Review of Systems  Constitutional: Negative for chills and fever.  Skin: Positive for wound.  Neurological: Negative for weakness and numbness.  All other systems reviewed and are negative.    Physical Exam Updated Vital Signs BP 126/86 (BP Location: Right Arm)   Pulse 80   Temp 99.4 F (37.4 C) (Oral)   Resp 18   Ht 5\' 7"  (1.702 m)   Wt 77.1 kg   LMP 08/21/2018   SpO2 100%   BMI 26.63 kg/m   Physical Exam Vitals signs reviewed.  Constitutional:      Appearance: She is well-developed.  HENT:     Head: Normocephalic.  Cardiovascular:     Rate and Rhythm: Normal rate.  Pulmonary:     Effort: Pulmonary effort is normal.  Musculoskeletal:        General: Signs of injury present. No tenderness.  Skin:    Findings: Laceration present.     Comments: 2 cm subcutaneous laceration volar left fifth finger involving the proximal and middle phalanx.  Hemostatic.  There is a small area of tendon visualization without any evidence for disruption.  Distal sensation is intact with less than 2-second cap refill.  Patient can flex and extend all finger joints without weakness.  Neurological:     Mental Status: She is alert and oriented to person, place, and time.     Sensory: No sensory deficit.      ED Treatments / Results  Labs (all labs ordered are listed, but only abnormal  results are displayed) Labs Reviewed - No data to display  EKG None  Radiology No results found.  Procedures Procedures (including critical care time)  LACERATION REPAIR Performed by: Evalee Jefferson Authorized by: Evalee Jefferson Consent: Verbal consent obtained. Risks and benefits: risks, benefits and alternatives were discussed Consent given by: patient Patient identity confirmed: provided demographic data Prepped and Draped in normal sterile fashion Wound explored.  Visualized  to base of wound.  Required finger tourniquet given bleeding wound after betadine scrub and saline wash.  Laceration Location: left 5th finger  Laceration Length: 2cm  No Foreign Bodies seen or palpated  Anesthesia: local infiltration  Local anesthetic: lidocaine 1% without epinephrine  Anesthetic total: 2 ml  Irrigation method: syringe Amount of cleaning: standard  Skin closure: ethilon 4-0  Number of sutures: 7  Technique: simple interupted  Patient tolerance: Patient tolerated the procedure well with no immediate complications.   Medications Ordered in ED Medications  povidone-iodine (BETADINE) 10 % external solution ( Topical Given 11/30/18 1156)  lidocaine (PF) (XYLOCAINE) 1 % injection 5 mL (5 mLs Other Given 11/30/18 1156)  Tdap (BOOSTRIX) injection 0.5 mL (0.5 mLs Intramuscular Given 11/30/18 1153)     Initial Impression / Assessment and Plan / ED Course  I have reviewed the triage vital signs and the nursing notes.  Pertinent labs & imaging results that were available during my care of the patient were reviewed by me and considered in my medical decision making (see chart for details).        Wound care instructions given.  Pt advised to have sutures removed in 10 days,  Return here sooner for any signs of infection including redness, swelling, worse pain or drainage of pus.  Tetanus updated, keflex for prophylaxis.  Finger splint/dressing supplied.  No evidence of tendon  injury, referral to hand for prn f/u, but doubt this will be required.  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     Final Clinical Impressions(s) / ED Diagnoses   Final diagnoses:  Laceration of left little finger without foreign body without damage to nail, initial encounter    ED Discharge Orders         Ordered    cephALEXin (KEFLEX) 500 MG capsule  2 times daily     11/30/18 1245           Evalee Jefferson, PA-C 11/30/18 1345    Daleen Bo, MD 12/02/18 [phone]

## 2018-11-30 NOTE — ED Triage Notes (Signed)
Laceration to left pinky finger, cut on a can

## 2019-01-29 ENCOUNTER — Telehealth: Payer: Self-pay

## 2019-01-29 ENCOUNTER — Other Ambulatory Visit: Payer: Self-pay | Admitting: Obstetrics & Gynecology

## 2019-01-29 MED ORDER — METRONIDAZOLE 500 MG PO TABS: 500.0000 mg | ORAL_TABLET | Freq: Two times a day (BID) | ORAL | 0 refills | Status: DC

## 2019-01-29 NOTE — Telephone Encounter (Signed)
Pt aware Rx sent to pharmacy 

## 2019-01-29 NOTE — Telephone Encounter (Signed)
OK I have done this

## 2019-01-29 NOTE — Telephone Encounter (Signed)
Can you advise pt this was sent for her. Thank you

## 2019-01-29 NOTE — Telephone Encounter (Signed)
Pt is concerned she has BV again, she is experiencing an "very bad fishy smell, and some creamy discharge" pt states she has had it x4 days. Pt wanted to know since she recently had this problem if she could just have the antibiotics sent in if [facility] agrees that's what it sounds like.

## 2019-02-26 ENCOUNTER — Ambulatory Visit: Payer: Commercial Managed Care - PPO | Attending: Internal Medicine

## 2019-05-30 ENCOUNTER — Other Ambulatory Visit: Payer: Self-pay

## 2019-05-30 ENCOUNTER — Ambulatory Visit (INDEPENDENT_AMBULATORY_CARE_PROVIDER_SITE_OTHER): Payer: Commercial Managed Care - PPO | Admitting: Obstetrics and Gynecology

## 2019-05-30 ENCOUNTER — Encounter: Payer: Self-pay | Admitting: Obstetrics and Gynecology

## 2019-05-30 VITALS — BP 90/60 | Ht 67.0 in | Wt 174.0 lb

## 2019-05-30 DIAGNOSIS — B373 Candidiasis of vulva and vagina: Secondary | ICD-10-CM

## 2019-05-30 DIAGNOSIS — R300 Dysuria: Secondary | ICD-10-CM

## 2019-05-30 DIAGNOSIS — M546 Pain in thoracic spine: Secondary | ICD-10-CM | POA: Diagnosis not present

## 2019-05-30 DIAGNOSIS — B3731 Acute candidiasis of vulva and vagina: Secondary | ICD-10-CM

## 2019-05-30 LAB — POCT URINALYSIS DIPSTICK
Bilirubin, UA: NEGATIVE
Blood, UA: NEGATIVE
Glucose, UA: NEGATIVE
Ketones, UA: NEGATIVE
Leukocytes, UA: NEGATIVE
Nitrite, UA: NEGATIVE
Protein, UA: NEGATIVE
Spec Grav, UA: 1.025 (ref 1.010–1.025)
pH, UA: 5 (ref 5.0–8.0)

## 2019-05-30 LAB — POCT WET PREP WITH KOH
Clue Cells Wet Prep HPF POC: NEGATIVE
KOH Prep POC: NEGATIVE
Trichomonas, UA: NEGATIVE
Yeast Wet Prep HPF POC: POSITIVE

## 2019-05-30 MED ORDER — FLUCONAZOLE 150 MG PO TABS: 150.0000 mg | ORAL_TABLET | Freq: Once | ORAL | 0 refills | Status: AC

## 2019-05-30 NOTE — Progress Notes (Signed)
Celene Squibb, MD   Chief Complaint  Patient presents with  . Vaginal Discharge    irritation and itchiness, no odor- they have been recurrent since hysterectomy  . Urinary Tract Infection    had sx 3 weeks ago: pain, frequency, lower abd pain    HPI:      [name] is a 29 y.o. [id number] whose LMP was Patient's last menstrual period was 08/21/2018., presents today for increased d/c with irritation, no odor since last wk. Did teledoc and was given diflucan 5 days ago. Sx improving but still has itching, feels raw vaginally, with dysuria and pain with sex. No fishy odor this time. 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  Pos Gardnerella on culture 11/20. Treated with flagyl 11/20 and 1/21. Started probiotics without sx changes. Is sex active, no new partners for the past 8 months. Uses scented soap and dryer sheets, bath bombs occas. Uses cotton underwear, no wipes or thongs.   Had urinary frequency, pelvic pain, LBP a few wks ago. Sx resolved but cont to have mid back pain.   TLH BS for pelvic pain and endometriosis with Dr. Kenton Kingfisher 9/20. Sx much improved.   Past Medical History:  Diagnosis Date  . Complication of anesthesia    difficulty waking up  . Endometriosis   . Family history of adverse reaction to anesthesia    mother also diffucult to wake up after   . Family history of breast cancer    genetic testing letter sent 5/18  . GERD (gastroesophageal reflux disease)    ONLY WHEN PREGNANT  . Phlebitis following infusion    LEFT ARM-AFTER HAVING WISDOME TEETH TAKEN OUT IN APRIL 2017  . Urinary tract infection     Past Surgical History:  Procedure Laterality Date  . ABDOMINAL HYSTERECTOMY    . ABLATION ON ENDOMETRIOSIS  2017  . CESAREAN SECTION  2011  . CESAREAN SECTION WITH BILATERAL TUBAL LIGATION N/A 07/05/2017   Procedure: CESAREAN SECTION WITH BILATERAL TUBAL LIGATION;  Surgeon: Will Bonnet, MD;  Location: [facility];  Service: Obstetrics;   Laterality: N/A;  . CHROMOPERTUBATION N/A 06/06/2015   Procedure: CHROMOPERTUBATION;  Surgeon: Gae Dry, MD;  Location: [facility];  Service: Gynecology;  Laterality: N/A;  . LAPAROSCOPY N/A 06/06/2015   Procedure: LAPAROSCOPY OPERATIVE/ EXCISION OF ENDOMETRIOSIS;  Surgeon: Gae Dry, MD;  Location: [facility];  Service: Gynecology;  Laterality: N/A;  . TONSILLECTOMY    . TOTAL LAPAROSCOPIC HYSTERECTOMY WITH BILATERAL SALPINGO OOPHORECTOMY N/A 09/07/2018   Procedure: TOTAL LAPAROSCOPIC HYSTERECTOMY WITH BILATERAL SALPINGO;  Surgeon: Gae Dry, MD;  Location: [facility];  Service: Gynecology;  Laterality: N/A;  . TUBAL LIGATION    . WISDOM TOOTH EXTRACTION  04-2015    Family History  Problem Relation Age of Onset  . Hypertension Mother   . Diabetes Mother   . Migraines Mother   . Heart failure Mother        Coded during delivery of second child  . Hypertension Father   . Diabetes Father   . Heart attack Father 80  . Brain cancer Paternal Grandfather   . Brain cancer Cousin   . Breast cancer Paternal Aunt 110  . Breast cancer Paternal Aunt 27  . Breast cancer Paternal Aunt 29    Social History   Socioeconomic History  . Marital status: Married    Spouse name: Not on file  . Number of children: 1  . Years  of education: College  . Highest education level: Not on file  Occupational History  . Occupation: Teacher, early years/pre  Tobacco Use  . Smoking status: Never Smoker  . Smokeless tobacco: Never Used  Substance and Sexual Activity  . Alcohol use: No  . Drug use: No  . Sexual activity: Yes    Partners: Male    Birth control/protection: Surgical    Comment: Hysterectomy  Other Topics Concern  . Not on file  Social History Narrative   Lives at home w/ her husband and daughter   Right-handed   Caffeine: coffee in the morning, tea throughout the day   Social Determinants of Health   Financial Resource Strain:   . Difficulty of Paying Living Expenses:   Food  Insecurity:   . Worried About Charity fundraiser in the Last Year:   . Arboriculturist in the Last Year:   Transportation Needs:   . Film/video editor (Medical):   Marland Kitchen Lack of Transportation (Non-Medical):   Physical Activity:   . Days of Exercise per Week:   . Minutes of Exercise per Session:   Stress:   . Feeling of Stress :   Social Connections:   . Frequency of Communication with Friends and Family:   . Frequency of Social Gatherings with Friends and Family:   . Attends Religious Services:   . Active Member of Clubs or Organizations:   . Attends Archivist Meetings:   Marland Kitchen Marital Status:   Intimate Partner Violence:   . Fear of Current or Ex-Partner:   . Emotionally Abused:   Marland Kitchen Physically Abused:   . Sexually Abused:     Outpatient Medications Prior to Visit  Medication Sig Dispense Refill  . acetaminophen (TYLENOL) 325 MG tablet Take 650 mg by mouth every 6 (six) hours as needed (for pain.).    Marland Kitchen cephALEXin (KEFLEX) 500 MG capsule Take 1 capsule (500 mg total) by mouth 2 (two) times daily. 14 capsule 0  . ciprofloxacin (CIPRO) 500 MG tablet Take 500 mg by mouth 2 (two) times daily.    Marland Kitchen HYDROcodone-acetaminophen (NORCO) 5-325 MG tablet Take 1 tablet by mouth every 6 (six) hours as needed for moderate pain. Take 2 pills One Hour Prior to Procedure 15 tablet 0  . metroNIDAZOLE (FLAGYL) 500 MG tablet Take 1 tablet (500 mg total) by mouth 2 (two) times daily. 14 tablet 0   No facility-administered medications prior to visit.      ROS:  Review of Systems  Constitutional: Negative for fever.  Gastrointestinal: Negative for blood in stool, constipation, diarrhea, nausea and vomiting.  Genitourinary: Positive for dyspareunia, dysuria, vaginal discharge and vaginal pain. Negative for flank pain, frequency, hematuria, urgency and vaginal bleeding.  Musculoskeletal: Positive for back pain.  Skin: Negative for rash.   OBJECTIVE:   Vitals:  BP 90/60   Ht 5\' 7"   (1.702 m)   Wt 174 lb (78.9 kg)   LMP 08/21/2018   Breastfeeding No   BMI 27.25 kg/m   Physical Exam Vitals reviewed.  Constitutional:      Appearance: She is well-developed.  Pulmonary:     Effort: Pulmonary effort is normal.  Abdominal:     Tenderness: There is no right CVA tenderness or left CVA tenderness.  Genitourinary:    General: Normal vulva.     Pubic Area: No rash.      Labia:        Right: No rash, tenderness or lesion.  Left: No rash, tenderness or lesion.      Vagina: Vaginal discharge present. No erythema or tenderness.     Uterus: Absent.      Adnexa: Right adnexa normal and left adnexa normal.       Right: No mass or tenderness.         Left: No mass or tenderness.       Comments: FEW FISSURES NEAR POST FOURCHETTE; POS D/C EXT Musculoskeletal:        General: Normal range of motion.       Arms:     Cervical back: Normal range of motion.  Skin:    General: Skin is warm and dry.  Neurological:     General: No focal deficit present.     Mental Status: She is alert and oriented to person, place, and time.  Psychiatric:        Mood and Affect: Mood normal.        Behavior: Behavior normal.        Thought Content: Thought content normal.        Judgment: Judgment normal.     Results: Results for orders placed or performed in visit on 05/30/19 (from the past 24 hour(s))  POCT Wet Prep with KOH     Status: Abnormal   Collection Time: 05/30/19  3:24 PM  Result Value Ref Range   Trichomonas, UA Negative    Clue Cells Wet Prep HPF POC neg    Epithelial Wet Prep HPF POC     Yeast Wet Prep HPF POC pos    Bacteria Wet Prep HPF POC     RBC Wet Prep HPF POC     WBC Wet Prep HPF POC     KOH Prep POC Negative Negative  POCT Urinalysis Dipstick     Status: Normal   Collection Time: 05/30/19  3:27 PM  Result Value Ref Range   Color, UA yellow    Clarity, UA clear    Glucose, UA Negative Negative   Bilirubin, UA neg    Ketones, UA neg    Spec Grav,  UA 1.025 1.010 - 1.025   Blood, UA neg    pH, UA 5.0 5.0 - 8.0   Protein, UA Negative Negative   Urobilinogen, UA     Nitrite, UA neg    Leukocytes, UA Negative Negative   Appearance     Odor       Assessment/Plan: Candidal vaginitis - Plan: POCT Wet Prep with KOH, fluconazole (DIFLUCAN) 150 MG tablet; Recurrent sx. Not fully treated this time with 1 diflucan. Rx RF diflucan. If sx persist, will do terazol Rx. Dove sens skin soap, line dry underwear, no bath bombs, repHresh after sex. Cont probiotics. If recurrence persists, will do wkly diflucan as preventive. F/u prn.   Dysuria - Plan: POCT Urinalysis Dipstick; Neg UA. Reassurance. Most likely related to vaginitis.   Acute midline thoracic back pain - Plan: POCT Urinalysis Dipstick, Neg UA, no CMT Bilat. Tender on exam. MSK. Ice/heat/stretch. 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    Meds ordered this encounter  Medications  . fluconazole (DIFLUCAN) 150 MG tablet    Sig: Take 1 tablet (150 mg total) by mouth once for 1 dose.    Dispense:  1 tablet    Refill:  0    Order Specific Question:   Supervising Provider    Answer:   Gae Dry [id number]      Return if symptoms worsen or fail  to improve.  Surah Pelley B. Fionnuala Hemmerich, PA-C 05/30/2019 3:28 PM

## 2019-05-30 NOTE — Patient Instructions (Signed)
I value your feedback and entrusting us with your care. If you get a [facility] patient survey, I would appreciate you taking the time to let us know about your experience today. Thank you!  As of December 14, 2018, your lab results will be released to your MyChart immediately, before I even have a chance to see them. Please give me time to review them and contact you if there are any abnormalities. Thank you for your patience.   HEALTHY VAGINAL HYGIENE  AVOID   Panytyhose Synthetic underwear (wear COTTON underwear)  Tight pants/jeans Thongs Pantyliners Scented soaps/shower gels (use Dove Sensitive Skin soap or water to clean) Bubble bath/bath bombs Scented detergents  ALL dryer sheets (line dry underwear if using them on your other clothing) Feminine sprays/douches   FOR RECURRENT BACTERIAL VAGINOSIS (BV) Above recommendations and ADD probiotics daily, USE CONDOMS  Visit [email or web address]     

## 2019-09-04 ENCOUNTER — Telehealth: Payer: Self-pay

## 2019-09-04 ENCOUNTER — Other Ambulatory Visit: Payer: Self-pay | Admitting: Obstetrics and Gynecology

## 2019-09-04 MED ORDER — FLUCONAZOLE 150 MG PO TABS: ORAL_TABLET | ORAL | 0 refills | Status: DC

## 2019-09-04 NOTE — Telephone Encounter (Signed)
Yes she has tried those two. She doesn't remember if she tried the terconazole vag cream, but she hates creams. Says she doesn't want that as an option.

## 2019-09-04 NOTE — Telephone Encounter (Signed)
Pt calling; since last appt has had four yeast infs and visits c telehealth to get diflucan; states you talked about a tx plan of taking diflucan once a week for six months. Would like that rx'd.  [phone]

## 2019-09-04 NOTE — Telephone Encounter (Signed)
Rx eRxd. Pt to follow directions on bottle. Take Q 3 days for 3 doses, then once wkly for 3 months. F/u in between if sx recur--don't do teledoc for more tx. I need to know if maintenance is working. Thx.

## 2019-09-04 NOTE — Telephone Encounter (Signed)
Pt aware.

## 2019-09-04 NOTE — Progress Notes (Signed)
Rx diflucan for recurrent complicated yeast vag as prevention

## 2019-09-04 NOTE — Telephone Encounter (Signed)
Has pt changed to unscented soaps, no dryer sheets? Has she tried terconazole vaginal cream for 7 nights recently?

## 2019-12-14 ENCOUNTER — Encounter: Payer: Self-pay | Admitting: Obstetrics and Gynecology

## 2019-12-14 ENCOUNTER — Other Ambulatory Visit (HOSPITAL_COMMUNITY)
Admission: RE | Admit: 2019-12-14 | Discharge: 2019-12-14 | Disposition: A | Discharge status: Home / Self Care | Payer: Commercial Managed Care - PPO | Source: Ambulatory Visit | Attending: Obstetrics and Gynecology | Admitting: Obstetrics and Gynecology

## 2019-12-14 ENCOUNTER — Ambulatory Visit (INDEPENDENT_AMBULATORY_CARE_PROVIDER_SITE_OTHER): Payer: Commercial Managed Care - PPO | Admitting: Obstetrics and Gynecology

## 2019-12-14 ENCOUNTER — Other Ambulatory Visit: Payer: Self-pay

## 2019-12-14 VITALS — BP 110/60 | Ht 67.0 in | Wt 181.0 lb

## 2019-12-14 DIAGNOSIS — R1909 Other intra-abdominal and pelvic swelling, mass and lump: Secondary | ICD-10-CM

## 2019-12-14 NOTE — Progress Notes (Signed)
Patient ID: Stephanie Hayden, female   DOB: [birth year], 29 y.o.   MRN: [id number]  Reason for Consult: Vaginal Exam (Lump, tender x 1 month.) and Follow-up (Possibly starting on weight loss med again)   Referred by Celene Squibb, MD  Subjective:     HPI:  Stephanie Hayden is a 29 y.o. female who presents for evaluation of "lump" on groin. Patient reports the lump has been present for one month. It causes discomfort daily when it is manipulated or "bumped." Patient denies S&S of infection or discharge from site.  Gynecological History Patient currently undergoing treatment for recurrent vaginal candida.   Obstetrical History [id number]  Past Medical History:  Diagnosis Date  . Complication of anesthesia    difficulty waking up  . Endometriosis   . Family history of adverse reaction to anesthesia    mother also diffucult to wake up after   . Family history of breast cancer    genetic testing letter sent 5/18  . GERD (gastroesophageal reflux disease)    ONLY WHEN PREGNANT  . Phlebitis following infusion    LEFT ARM-AFTER HAVING WISDOME TEETH TAKEN OUT IN APRIL 2017  . Urinary tract infection    Family History  Problem Relation Age of Onset  . Hypertension Mother   . Diabetes Mother   . Migraines Mother   . Heart failure Mother        Coded during delivery of second child  . Hypertension Father   . Diabetes Father   . Heart attack Father 31  . Brain cancer Paternal Grandfather   . Brain cancer Cousin   . Breast cancer Paternal Aunt 79  . Breast cancer Paternal Aunt 77  . Breast cancer Paternal Aunt 70   Past Surgical History:  Procedure Laterality Date  . ABDOMINAL HYSTERECTOMY    . ABLATION ON ENDOMETRIOSIS  2017  . CESAREAN SECTION  2011  . CESAREAN SECTION WITH BILATERAL TUBAL LIGATION N/A 07/05/2017   Procedure: CESAREAN SECTION WITH BILATERAL TUBAL LIGATION;  Surgeon: Will Bonnet, MD;  Location: [facility];  Service: Obstetrics;  Laterality: N/A;  .  CHROMOPERTUBATION N/A 06/06/2015   Procedure: CHROMOPERTUBATION;  Surgeon: Gae Dry, MD;  Location: [facility];  Service: Gynecology;  Laterality: N/A;  . LAPAROSCOPY N/A 06/06/2015   Procedure: LAPAROSCOPY OPERATIVE/ EXCISION OF ENDOMETRIOSIS;  Surgeon: Gae Dry, MD;  Location: [facility];  Service: Gynecology;  Laterality: N/A;  . TONSILLECTOMY    . TOTAL LAPAROSCOPIC HYSTERECTOMY WITH BILATERAL SALPINGO OOPHORECTOMY N/A 09/07/2018   Procedure: TOTAL LAPAROSCOPIC HYSTERECTOMY WITH BILATERAL SALPINGO;  Surgeon: Gae Dry, MD;  Location: [facility];  Service: Gynecology;  Laterality: N/A;  . TUBAL LIGATION    . WISDOM TOOTH EXTRACTION  04-2015    Short Social History:  Social History   Tobacco Use  . Smoking status: Never Smoker  . Smokeless tobacco: Never Used  Substance Use Topics  . Alcohol use: No    Allergies  Allergen Reactions  . Coconut Flavor Anaphylaxis  . Morphine And Related Hives and Swelling    Swelling of airway. Benadryl controlled reaction when she used morphine last time    No current outpatient medications on file.   No current facility-administered medications for this visit.    Review of Systems  All other systems reviewed and are negative       Objective:  Objective   Vitals:   12/14/19 1400  BP: 110/60  Weight: 181 lb (82.1 kg)  Height:  5\' 7"  (1.702 m)   Body mass index is 28.35 kg/m.  Physical Exam Constitutional:      Appearance: Normal appearance. She is normal weight.  HENT:     Head: Normocephalic.  Eyes:     Pupils: Pupils are equal, round, and reactive to light.  Cardiovascular:     Rate and Rhythm: Normal rate.  Pulmonary:     Effort: Pulmonary effort is normal.  Abdominal:     General: Abdomen is flat.     Palpations: Abdomen is soft.  Genitourinary:    General: Normal vulva.       Comments: Inguinal lymph node chain palpated bilaterally, equal in size, removed from site of lump  Musculoskeletal:         General: Normal range of motion.     Cervical back: Normal range of motion.  Skin:    General: Skin is warm and dry.  Neurological:     Mental Status: She is alert and oriented to person, place, and time.  Psychiatric:        Mood and Affect: Mood normal.        Behavior: Behavior normal.     Assessment/Plan:     29 yo female present for evaluation of lump located on the left aspect of the mons pubis.  -No S&S concerning for infection at site, isolated from inguinal lymph node chain -Recommended continue to monitor shape and size - RTC in 1-2 months for f/u     Orlie Pollen, [facility], [facility] 12/14/2019 2:34 PM

## 2019-12-18 LAB — CERVICOVAGINAL ANCILLARY ONLY
Bacterial Vaginitis (gardnerella): NEGATIVE
Candida Glabrata: NEGATIVE
Candida Vaginitis: NEGATIVE
Chlamydia: NEGATIVE
Comment: NEGATIVE
Comment: NEGATIVE
Comment: NEGATIVE
Comment: NEGATIVE
Comment: NEGATIVE
Comment: NORMAL
Neisseria Gonorrhea: NEGATIVE
Trichomonas: NEGATIVE

## 2020-01-14 ENCOUNTER — Ambulatory Visit: Payer: Commercial Managed Care - PPO | Admitting: Obstetrics and Gynecology

## 2020-01-23 ENCOUNTER — Ambulatory Visit: Payer: Commercial Managed Care - PPO | Admitting: Obstetrics and Gynecology

## 2020-01-23 NOTE — Patient Instructions (Incomplete)
I value your feedback and you entrusting us with your care. If you get a [facility] patient survey, I would appreciate you taking the time to let us know about your experience today. Thank you! ? ? ?

## 2020-01-23 NOTE — Progress Notes (Deleted)
Celene Squibb, MD   No chief complaint on file.   HPI:      [name] is a 30 y.o. [id number] whose LMP was Patient's last menstrual period was 08/21/2018., presents today for ***    Past Medical History:  Diagnosis Date  . Complication of anesthesia    difficulty waking up  . Endometriosis   . Family history of adverse reaction to anesthesia    mother also diffucult to wake up after   . Family history of breast cancer    genetic testing letter sent 5/18  . GERD (gastroesophageal reflux disease)    ONLY WHEN PREGNANT  . Phlebitis following infusion    LEFT ARM-AFTER HAVING WISDOME TEETH TAKEN OUT IN APRIL 2017  . Urinary tract infection     Past Surgical History:  Procedure Laterality Date  . ABDOMINAL HYSTERECTOMY    . ABLATION ON ENDOMETRIOSIS  2017  . CESAREAN SECTION  2011  . CESAREAN SECTION WITH BILATERAL TUBAL LIGATION N/A 07/05/2017   Procedure: CESAREAN SECTION WITH BILATERAL TUBAL LIGATION;  Surgeon: Will Bonnet, MD;  Location: [facility];  Service: Obstetrics;  Laterality: N/A;  . CHROMOPERTUBATION N/A 06/06/2015   Procedure: CHROMOPERTUBATION;  Surgeon: Gae Dry, MD;  Location: [facility];  Service: Gynecology;  Laterality: N/A;  . LAPAROSCOPY N/A 06/06/2015   Procedure: LAPAROSCOPY OPERATIVE/ EXCISION OF ENDOMETRIOSIS;  Surgeon: Gae Dry, MD;  Location: [facility];  Service: Gynecology;  Laterality: N/A;  . TONSILLECTOMY    . TOTAL LAPAROSCOPIC HYSTERECTOMY WITH BILATERAL SALPINGO OOPHORECTOMY N/A 09/07/2018   Procedure: TOTAL LAPAROSCOPIC HYSTERECTOMY WITH BILATERAL SALPINGO;  Surgeon: Gae Dry, MD;  Location: [facility];  Service: Gynecology;  Laterality: N/A;  . TUBAL LIGATION    . WISDOM TOOTH EXTRACTION  04-2015    Family History  Problem Relation Age of Onset  . Hypertension Mother   . Diabetes Mother   . Migraines Mother   . Heart failure Mother        Coded during delivery of second child  . Hypertension Father   .  Diabetes Father   . Heart attack Father 82  . Brain cancer Paternal Grandfather   . Brain cancer Cousin   . Breast cancer Paternal Aunt 74  . Breast cancer Paternal Aunt 60  . Breast cancer Paternal Aunt 48    Social History   Socioeconomic History  . Marital status: Married    Spouse name: Not on file  . Number of children: 1  . Years of education: College  . Highest education level: Not on file  Occupational History  . Occupation: Teacher, early years/pre  Tobacco Use  . Smoking status: Never Smoker  . Smokeless tobacco: Never Used  Vaping Use  . Vaping Use: Never used  Substance and Sexual Activity  . Alcohol use: No  . Drug use: No  . Sexual activity: Yes    Partners: Male    Birth control/protection: Surgical    Comment: Hysterectomy  Other Topics Concern  . Not on file  Social History Narrative   Lives at home w/ her husband and daughter   Right-handed   Caffeine: coffee in the morning, tea throughout the day   Social Determinants of Health   Financial Resource Strain: Not on file  Food Insecurity: Not on file  Transportation Needs: Not on file  Physical Activity: Not on file  Stress: Not on file  Social Connections: Not on file  Intimate Partner Violence:  Not on file    No outpatient medications prior to visit.   No facility-administered medications prior to visit.      ROS:  Review of Systems BREAST: No symptoms   OBJECTIVE:   Vitals:  LMP 08/21/2018   Physical Exam  Results: No results found for this or any previous visit (from the past 24 hour(s)).   Assessment/Plan: No diagnosis found.    No orders of the defined types were placed in this encounter.     No follow-ups on file.  Sheanna Dail B. Zohaib Heeney, PA-C 01/23/2020 10:52 AM

## 2020-04-04 DIAGNOSIS — Z9189 Other specified personal risk factors, not elsewhere classified: Secondary | ICD-10-CM

## 2020-04-04 DIAGNOSIS — Z1371 Encounter for nonprocreative screening for genetic disease carrier status: Secondary | ICD-10-CM

## 2020-04-04 HISTORY — DX: Encounter for nonprocreative screening for genetic disease carrier status: Z13.71

## 2020-04-04 HISTORY — DX: Other specified personal risk factors, not elsewhere classified: Z91.89

## 2020-04-22 ENCOUNTER — Other Ambulatory Visit: Payer: Self-pay

## 2020-04-22 ENCOUNTER — Ambulatory Visit (INDEPENDENT_AMBULATORY_CARE_PROVIDER_SITE_OTHER): Payer: Commercial Managed Care - PPO | Admitting: Obstetrics and Gynecology

## 2020-04-22 ENCOUNTER — Encounter: Payer: Self-pay | Admitting: Obstetrics and Gynecology

## 2020-04-22 VITALS — BP 100/70 | Ht 67.0 in | Wt 184.0 lb

## 2020-04-22 DIAGNOSIS — Z1379 Encounter for other screening for genetic and chromosomal anomalies: Secondary | ICD-10-CM | POA: Diagnosis not present

## 2020-04-22 DIAGNOSIS — N6320 Unspecified lump in the left breast, unspecified quadrant: Secondary | ICD-10-CM | POA: Diagnosis not present

## 2020-04-22 DIAGNOSIS — Z803 Family history of malignant neoplasm of breast: Secondary | ICD-10-CM | POA: Diagnosis not present

## 2020-04-22 DIAGNOSIS — N644 Mastodynia: Secondary | ICD-10-CM | POA: Diagnosis not present

## 2020-04-22 NOTE — Progress Notes (Signed)
Stephanie Squibb, MD   Chief Complaint  Patient presents with  . Breast Exam    Lump on LB, burns and itches, tender if presses x 2 months    HPI:      Stephanie Hayden is a 30 y.o. [id number] whose LMP was Patient's last menstrual period was 08/21/2018., presents today for LT breast mass with pain with palpation and constant burning sensation. Sx for past 2 months and feels like area is getting bigger. No prior trauma, erythema, nipple d/c. No prior breast issues/masss, no prior imaging. Pt drinks 1 caffeine drink daily. Strong FH breast cancer in 3 pat aunts and 1 mat aunt, genetic testing not done. S/p TLHBS (still has ovaries) 9/20 for endometriosis and pelvic pain, sx resolved.   Past Medical History:  Diagnosis Date  . Complication of anesthesia    difficulty waking up  . Endometriosis   . Family history of adverse reaction to anesthesia    mother also diffucult to wake up after   . Family history of breast cancer    genetic testing letter sent 5/18  . GERD (gastroesophageal reflux disease)    ONLY WHEN PREGNANT  . Phlebitis following infusion    LEFT ARM-AFTER HAVING WISDOME TEETH TAKEN OUT IN APRIL 2017  . Urinary tract infection     Past Surgical History:  Procedure Laterality Date  . ABDOMINAL HYSTERECTOMY    . ABLATION ON ENDOMETRIOSIS  2017  . CESAREAN SECTION  2011  . CESAREAN SECTION WITH BILATERAL TUBAL LIGATION N/A 07/05/2017   Procedure: CESAREAN SECTION WITH BILATERAL TUBAL LIGATION;  Surgeon: Will Bonnet, MD;  Location: [facility];  Service: Obstetrics;  Laterality: N/A;  . CHROMOPERTUBATION N/A 06/06/2015   Procedure: CHROMOPERTUBATION;  Surgeon: Gae Dry, MD;  Location: [facility];  Service: Gynecology;  Laterality: N/A;  . LAPAROSCOPY N/A 06/06/2015   Procedure: LAPAROSCOPY OPERATIVE/ EXCISION OF ENDOMETRIOSIS;  Surgeon: Gae Dry, MD;  Location: [facility];  Service: Gynecology;  Laterality: N/A;  . TONSILLECTOMY    . TOTAL LAPAROSCOPIC  HYSTERECTOMY WITH BILATERAL SALPINGO OOPHORECTOMY N/A 09/07/2018   Procedure: TOTAL LAPAROSCOPIC HYSTERECTOMY WITH BILATERAL SALPINGO;  Surgeon: Gae Dry, MD;  Location: [facility];  Service: Gynecology;  Laterality: N/A;  . TOTAL LAPAROSCOPIC HYSTERECTOMY WITH SALPINGECTOMY  09/2018   with Dr. Kenton Kingfisher, endometriosis; still has bilat ovaries per path report  . TUBAL LIGATION    . WISDOM TOOTH EXTRACTION  04-2015    Family History  Problem Relation Age of Onset  . Hypertension Mother   . Diabetes Mother   . Migraines Mother   . Heart failure Mother        Coded during delivery of second child  . Hypertension Father   . Diabetes Father   . Heart attack Father 24  . Brain cancer Paternal Grandfather   . Brain cancer Cousin   . Breast cancer Paternal Aunt 23  . Breast cancer Paternal Aunt 22  . Breast cancer Paternal Aunt 86  . Breast cancer Maternal Aunt 60    Social History   Socioeconomic History  . Marital status: Married    Spouse name: Not on file  . Number of children: 1  . Years of education: College  . Highest education level: Not on file  Occupational History  . Occupation: Teacher, early years/pre  Tobacco Use  . Smoking status: Never Smoker  . Smokeless tobacco: Never Used  Vaping Use  . Vaping Use: Never used  Substance and Sexual Activity  . Alcohol use: No  . Drug use: No  . Sexual activity: Yes    Partners: Male    Birth control/protection: Surgical    Comment: Hysterectomy  Other Topics Concern  . Not on file  Social History Narrative   Lives at home w/ her husband and daughter   Right-handed   Caffeine: coffee in the morning, tea throughout the day   Social Determinants of Health   Financial Resource Strain: Not on file  Food Insecurity: Not on file  Transportation Needs: Not on file  Physical Activity: Not on file  Stress: Not on file  Social Connections: Not on file  Intimate Partner Violence: Not on file    Outpatient Medications Prior to  Visit  Medication Sig Dispense Refill  . buPROPion (WELLBUTRIN XL) 150 MG 24 hr tablet Take 1 tablet by mouth daily.    . busPIRone (BUSPAR) 7.5 MG tablet Take 7.5 mg by mouth 2 (two) times daily.    . cetirizine (ZYRTEC) 10 MG tablet Take 10 mg by mouth at bedtime.     No facility-administered medications prior to visit.      ROS:  Review of Systems  Constitutional: Negative for fever.  Gastrointestinal: Negative for blood in stool, constipation, diarrhea, nausea and vomiting.  Genitourinary: Negative for dyspareunia, dysuria, flank pain, frequency, hematuria, urgency, vaginal bleeding, vaginal discharge and vaginal pain.  Musculoskeletal: Negative for back pain.  Skin: Negative for rash.   BREAST: mass, pain   OBJECTIVE:   Vitals:  BP 100/70   Ht 5' 7" (1.702 m)   Wt 184 lb (83.5 kg)   LMP 08/21/2018   BMI 28.82 kg/m   Physical Exam Vitals reviewed.  Pulmonary:     Effort: Pulmonary effort is normal.  Chest:  Breasts: Breasts are symmetrical.     Right: No inverted nipple, mass, nipple discharge, skin change or tenderness.     Left: No inverted nipple, mass, nipple discharge, skin change or tenderness.     Musculoskeletal:        General: Normal range of motion.     Cervical back: Normal range of motion.  Skin:    General: Skin is warm and dry.  Neurological:     General: No focal deficit present.     Mental Status: She is alert and oriented to person, place, and time.     Cranial Nerves: No cranial nerve deficit.  Psychiatric:        Mood and Affect: Mood normal.        Behavior: Behavior normal.        Thought Content: Thought content normal.        Judgment: Judgment normal.     Assessment/Plan: Left breast mass - Plan: MM DIAG BREAST TOMO BILATERAL, US BREAST LTD UNI LEFT INC AXILLA, US BREAST LTD UNI RIGHT INC AXILLA; 2:00 position. Check dx mammo and u/s. Will f/u with results.   Breast pain, left - Plan: MM DIAG BREAST TOMO BILATERAL, US BREAST  LTD UNI LEFT INC AXILLA, US BREAST LTD UNI RIGHT INC AXILLA  Family history of breast cancer - Plan: MM DIAG BREAST TOMO BILATERAL, US BREAST LTD UNI LEFT INC AXILLA, US BREAST LTD UNI RIGHT INC AXILLA, Integrated [employer]AC[employer] ([place]); MyRisk testing discussed and done today. Will call with results. Still has ovaries    Return if symptoms worsen or fail to improve.  Charmon Thorson B. Wes Lezotte, PA-C 04/22/2020 4:52 PM

## 2020-04-22 NOTE — Patient Instructions (Signed)
I value your feedback and you entrusting us with your care. If you get a [facility] patient survey, I would appreciate you taking the time to let us know about your experience today. Thank you! ? ? ?

## 2020-04-28 ENCOUNTER — Ambulatory Visit
Admission: RE | Admit: 2020-04-28 | Discharge: 2020-04-28 | Disposition: A | Discharge status: Home / Self Care | Payer: Commercial Managed Care - PPO | Source: Ambulatory Visit | Attending: Obstetrics and Gynecology | Admitting: Obstetrics and Gynecology

## 2020-04-28 ENCOUNTER — Other Ambulatory Visit: Payer: Self-pay

## 2020-04-28 DIAGNOSIS — N6320 Unspecified lump in the left breast, unspecified quadrant: Secondary | ICD-10-CM

## 2020-04-28 DIAGNOSIS — Z803 Family history of malignant neoplasm of breast: Secondary | ICD-10-CM

## 2020-04-28 DIAGNOSIS — N644 Mastodynia: Secondary | ICD-10-CM

## 2020-04-29 ENCOUNTER — Telehealth: Payer: Self-pay | Admitting: Obstetrics and Gynecology

## 2020-04-29 DIAGNOSIS — N644 Mastodynia: Secondary | ICD-10-CM

## 2020-04-29 DIAGNOSIS — N6320 Unspecified lump in the left breast, unspecified quadrant: Secondary | ICD-10-CM

## 2020-04-29 DIAGNOSIS — Z803 Family history of malignant neoplasm of breast: Secondary | ICD-10-CM

## 2020-04-29 NOTE — Telephone Encounter (Signed)
Spoke with pt re: neg mammo/u/s results. Given area of concern, will do MRI per radiology recommendation. Pt also to d/c caffeine and start Vit E 400 IU BID. Also discussed gen surg ref if MRI neg.   Pt also received estimated OOP from [facility] of $1700. I have reached out to Mikal Plane to get clarification on this. Told pt to call them to put test on hold while we figure this out.

## 2020-04-29 NOTE — Telephone Encounter (Signed)
Heard back from [employer] from [employer]. Pt has high deductible plan hence the high cost. Mason from [facility] will be contacting pt to be able to offer it at cheaper rate or self pay rate. Pt aware to talk with him.

## 2020-05-06 ENCOUNTER — Encounter: Payer: Self-pay | Admitting: Obstetrics and Gynecology

## 2020-05-16 ENCOUNTER — Ambulatory Visit
Admission: RE | Admit: 2020-05-16 | Discharge: 2020-05-16 | Disposition: A | Discharge status: Home / Self Care | Payer: Commercial Managed Care - PPO | Source: Ambulatory Visit | Attending: Obstetrics and Gynecology | Admitting: Obstetrics and Gynecology

## 2020-05-16 ENCOUNTER — Other Ambulatory Visit: Payer: Self-pay

## 2020-05-16 DIAGNOSIS — Z803 Family history of malignant neoplasm of breast: Secondary | ICD-10-CM

## 2020-05-16 DIAGNOSIS — N6320 Unspecified lump in the left breast, unspecified quadrant: Secondary | ICD-10-CM

## 2020-05-16 DIAGNOSIS — N644 Mastodynia: Secondary | ICD-10-CM

## 2020-05-16 MED ORDER — GADOBUTROL 1 MMOL/ML IV SOLN
8.0000 mL | Freq: Once | INTRAVENOUS | Status: AC | PRN
  Administered 2020-05-16: 8 mL via INTRAVENOUS

## 2020-05-19 ENCOUNTER — Encounter: Payer: Self-pay | Admitting: Obstetrics and Gynecology

## 2020-05-19 ENCOUNTER — Telehealth: Payer: Self-pay | Admitting: Obstetrics and Gynecology

## 2020-05-19 NOTE — Telephone Encounter (Signed)
Pt aware of neg MyRisk results. IBIS=25.8%/riskscore=22.3%. Pt aware of recommendations of monthly SBE, yearly CBE and mammos, as well as scr breast MRI.  Patient understands these results only apply to her and her children, and this is not indicative of genetic testing results of her other family members. It is recommended that her other family members have genetic testing done.  Pt also understands negative genetic testing doesn't mean she will never get any of these cancers.   Hard copy mailed to pt. F/u prn.   Pt also aware of neg mammo and u/s results for LT breast mass and pain 04/28/20, as well as neg breast MRI 05/16/20. Pt still with breast mass (most likely prominent tissue) and burning. Drinks minimal caffeine but hasn't stopped. Suggested to stop for a couple wks just to see as well as start Vit E 400 IU BID. Offered gen surg ref as 2nd opinion, but pt declines for now. Will f/u if sx persist/change for ref. F/u prn.

## 2020-12-22 ENCOUNTER — Encounter: Payer: Self-pay | Admitting: Advanced Practice Midwife

## 2020-12-22 ENCOUNTER — Other Ambulatory Visit: Payer: Self-pay

## 2020-12-22 ENCOUNTER — Other Ambulatory Visit (HOSPITAL_COMMUNITY)
Admission: RE | Admit: 2020-12-22 | Discharge: 2020-12-22 | Disposition: A | Discharge status: Home / Self Care | Payer: Commercial Managed Care - PPO | Source: Ambulatory Visit | Attending: Advanced Practice Midwife | Admitting: Advanced Practice Midwife

## 2020-12-22 ENCOUNTER — Ambulatory Visit (INDEPENDENT_AMBULATORY_CARE_PROVIDER_SITE_OTHER): Payer: No Typology Code available for payment source | Admitting: Advanced Practice Midwife

## 2020-12-22 VITALS — BP 120/80 | Ht 67.0 in | Wt 185.0 lb

## 2020-12-22 DIAGNOSIS — Z3141 Encounter for fertility testing: Secondary | ICD-10-CM | POA: Diagnosis not present

## 2020-12-22 DIAGNOSIS — Z01419 Encounter for gynecological examination (general) (routine) without abnormal findings: Secondary | ICD-10-CM | POA: Insufficient documentation

## 2020-12-22 DIAGNOSIS — Z1272 Encounter for screening for malignant neoplasm of vagina: Secondary | ICD-10-CM | POA: Insufficient documentation

## 2020-12-22 NOTE — Patient Instructions (Signed)

## 2020-12-24 LAB — CYTOLOGY - PAP
Comment: NEGATIVE
Diagnosis: NEGATIVE
High risk HPV: NEGATIVE

## 2020-12-25 ENCOUNTER — Encounter: Payer: Self-pay | Admitting: Advanced Practice Midwife

## 2020-12-26 NOTE — Progress Notes (Signed)
Gynecology Annual Exam   PCP: Celene Squibb, MD  Chief Complaint:  Chief Complaint  Patient presents with   Annual Exam    History of Present Illness: Patient is a 30 y.o. [id number] presents for annual exam. The patient has no complaints today. She and her husband would like to have hormone level testing done today in the context of future egg harvesting. They are interested in having a pregnancy through a surrogate. They are advised that we can do the initial labs and they will have to seek the remainder of fertility care from [facility].    LMP: Patient's last menstrual period was 08/21/2018.  Postcoital Bleeding: no   The patient is sexually active. She currently uses status post hysterectomy for contraception. She denies dyspareunia.  The patient does perform self breast exams.  There is no notable family history of breast or ovarian cancer in her family. Patient had normal mammogram in May of this year for left breast mass/pain- likely dense tissue. She is BRCA negative and does have an increased risk level for development of Breast cancer. Recommendations: monthly SBE, yearly CBE and mammograms.  The patient wears seatbelts: yes.   The patient has regular exercise:  she has an active lifestyle. She admits healthy diet and hydration. She usually has 5-6 hours of sleep .    The patient denies current symptoms of depression. Her anxiety symptoms are controlled on zoloft.   Review of Systems: Review of Systems  Constitutional:  Negative for chills and fever.  HENT:  Negative for congestion, ear discharge, ear pain, hearing loss, sinus pain and sore throat.   Eyes:  Negative for blurred vision and double vision.  Respiratory:  Negative for cough, shortness of breath and wheezing.   Cardiovascular:  Negative for chest pain, palpitations and leg swelling.  Gastrointestinal:  Negative for abdominal pain, blood in stool, constipation, diarrhea, heartburn, melena, nausea and vomiting.   Genitourinary:  Negative for dysuria, flank pain, frequency, hematuria and urgency.  Musculoskeletal:  Negative for back pain, joint pain and myalgias.  Skin:  Negative for itching and rash.  Neurological:  Negative for dizziness, tingling, tremors, sensory change, speech change, focal weakness, seizures, loss of consciousness, weakness and headaches.  Endo/Heme/Allergies:  Negative for environmental allergies. Does not bruise/bleed easily.  Psychiatric/Behavioral:  Negative for depression, hallucinations, memory loss, substance abuse and suicidal ideas. The patient is not nervous/anxious and does not have insomnia.    Past Medical History:  Patient Active Problem List   Diagnosis Date Noted   Family history of breast cancer 04/22/2020   S/P laparoscopic hysterectomy 09/15/2018   Pelvic pain 83/15/1761   Dysmetabolic syndrome 60/73/7106   Over weight 04/10/2018   Obesity (BMI 30-39.9) 02/08/2018   Syncope 06/22/2017   History of cesarean section 02/01/2017   Dysmenorrhea 09/28/2016   Headache syndrome 01/19/2016   Endometriosis 06/06/2015    Past Surgical History:  Past Surgical History:  Procedure Laterality Date   ABDOMINAL HYSTERECTOMY     ABLATION ON ENDOMETRIOSIS  2017   CESAREAN SECTION  2011   CESAREAN SECTION WITH BILATERAL TUBAL LIGATION N/A 07/05/2017   Procedure: CESAREAN SECTION WITH BILATERAL TUBAL LIGATION;  Surgeon: Will Bonnet, MD;  Location: [facility];  Service: Obstetrics;  Laterality: N/A;   CHROMOPERTUBATION N/A 06/06/2015   Procedure: CHROMOPERTUBATION;  Surgeon: Gae Dry, MD;  Location: [facility];  Service: Gynecology;  Laterality: N/A;   LAPAROSCOPY N/A 06/06/2015   Procedure: LAPAROSCOPY OPERATIVE/ EXCISION OF  ENDOMETRIOSIS;  Surgeon: Gae Dry, MD;  Location: [facility];  Service: Gynecology;  Laterality: N/A;   TONSILLECTOMY     TOTAL LAPAROSCOPIC HYSTERECTOMY WITH BILATERAL SALPINGO OOPHORECTOMY N/A 09/07/2018   Procedure: TOTAL LAPAROSCOPIC  HYSTERECTOMY WITH BILATERAL SALPINGO;  Surgeon: Gae Dry, MD;  Location: [facility];  Service: Gynecology;  Laterality: N/A;   TOTAL LAPAROSCOPIC HYSTERECTOMY WITH SALPINGECTOMY  09/2018   with Dr. Kenton Kingfisher, endometriosis; still has bilat ovaries per path report   [place] EXTRACTION  04-2015    Gynecologic History:  Patient's last menstrual period was 08/21/2018. Contraception: status post hysterectomy Last Pap: 4 years ago Results were: no abnormalities   Obstetric History: [id number]  Family History:  Family History  Problem Relation Age of Onset   Hypertension Mother    Diabetes Mother    Migraines Mother    Heart failure Mother        Coded during delivery of second child   Hypertension Father    Diabetes Father    Heart attack Father 60   Brain cancer Paternal Grandfather    Brain cancer Cousin    Breast cancer Paternal Aunt 77   Breast cancer Paternal Aunt 10   Breast cancer Paternal Aunt 41   Breast cancer Maternal Aunt 60    Social History:  Social History   Socioeconomic History   Marital status: Single    Spouse name: Not on file   Number of children: 1   Years of education: College   Highest education level: Not on file  Occupational History   Occupation: [employer]  Tobacco Use   Smoking status: Never   Smokeless tobacco: Never  Vaping Use   Vaping Use: Never used  Substance and Sexual Activity   Alcohol use: No   Drug use: No   Sexual activity: Yes    Partners: Male    Birth control/protection: Surgical    Comment: Hysterectomy  Other Topics Concern   Not on file  Social History Narrative   Lives at home w/ her husband and daughter   Right-handed   Caffeine: coffee in the morning, tea throughout the day   Social Determinants of Health   Financial Resource Strain: Not on file  Food Insecurity: Not on file  Transportation Needs: Not on file  Physical Activity: Not on file  Stress: Not on file  Social  Connections: Not on file  Intimate Partner Violence: Not on file    Allergies:  Allergies  Allergen Reactions   Coconut Flavor Anaphylaxis   Morphine And Related Hives and Swelling    Swelling of airway. Benadryl controlled reaction when she used morphine last time    Medications: Prior to Admission medications   Medication Sig Start Date End Date Taking? Authorizing Provider  sertraline (ZOLOFT) 25 MG tablet Take 25 mg by mouth daily.   Yes [provider]  buPROPion (WELLBUTRIN XL) 150 MG 24 hr tablet Take 1 tablet by mouth daily. Patient not taking: Reported on 12/22/2020 04/07/20   [provider]  busPIRone (BUSPAR) 7.5 MG tablet Take 7.5 mg by mouth 2 (two) times daily. Patient not taking: Reported on 12/22/2020 04/03/20   [provider]  cetirizine (ZYRTEC) 10 MG tablet Take 10 mg by mouth at bedtime. Patient not taking: Reported on 12/22/2020 04/08/20   [provider]    Physical Exam Vitals: Blood pressure 120/80, height '5\' 7"'  (1.702 m), weight 185 lb (83.9 kg), last menstrual  period 08/21/2018.  General: NAD HEENT: normocephalic, anicteric Thyroid: no enlargement, no palpable nodules Pulmonary: No increased work of breathing, CTAB Cardiovascular: RRR, distal pulses 2+ Breast: Breast symmetrical, no tenderness, no palpable nodules or masses, no skin or nipple retraction present, no nipple discharge.  No axillary or supraclavicular lymphadenopathy. Abdomen: NABS, soft, non-tender, non-distended.  Umbilicus without lesions.  No hepatomegaly, splenomegaly or masses palpable. No evidence of hernia  Genitourinary:  External: Normal external female genitalia.  Normal urethral meatus, normal Bartholin's and Skene's glands.    Vagina: Normal vaginal mucosa, no evidence of prolapse.    Cervix: surgically absent Extremities: no edema, erythema, or tenderness Neurologic: Grossly intact Psychiatric: mood appropriate, affect full   Assessment:  30 y.o. G2P2002 routine annual exam  Plan: Problem List Items Addressed This Visit   None Visit Diagnoses     Well woman exam with routine gynecological exam    -  Primary   Relevant Orders   Anti mullerian hormone   FSH/LH   Estradiol   Progesterone   Cytology - PAP (Completed)   Screening for vaginal cancer       Relevant Orders   Cytology - PAP (Completed)   Fertility testing       Relevant Orders   Anti mullerian hormone   FSH/LH   Estradiol   Progesterone       1) STI screening  was offered and declined  2)  ASCCP guidelines and rationale discussed.  Patient opts for every 5 years screening interval  3) Contraception - the patient is currently using  status post hysterectomy.    4) Routine healthcare maintenance including cholesterol, diabetes screening discussed.  Fertility hormone level testing today  5) Mammogram: due for annual mammo in May of 2023  6) Fertility: labs ordered today. Follow up care with [facility]  7) Return in about 1 year (around 12/22/2021) for annual established gyn.   Rod Can, [facility] 12/26/2020, 11:07 AM

## 2020-12-29 LAB — FSH/LH
FSH: 3.6 m[IU]/mL
LH: 8.7 m[IU]/mL

## 2020-12-29 LAB — PROGESTERONE: Progesterone: 11.9 ng/mL

## 2020-12-29 LAB — ESTRADIOL: Estradiol: 152 pg/mL

## 2020-12-29 LAB — ANTI MULLERIAN HORMONE: ANTI-MULLERIAN HORMONE (AMH): 0.657 ng/mL

## 2021-01-31 ENCOUNTER — Other Ambulatory Visit: Payer: Self-pay

## 2021-01-31 ENCOUNTER — Encounter: Payer: Self-pay | Admitting: *Deleted

## 2021-01-31 ENCOUNTER — Ambulatory Visit
Admission: EM | Admit: 2021-01-31 | Discharge: 2021-01-31 | Disposition: A | Discharge status: Home / Self Care | Payer: PRIVATE HEALTH INSURANCE | Attending: Family Medicine | Admitting: Family Medicine

## 2021-01-31 DIAGNOSIS — R319 Hematuria, unspecified: Secondary | ICD-10-CM | POA: Insufficient documentation

## 2021-01-31 DIAGNOSIS — N3090 Cystitis, unspecified without hematuria: Secondary | ICD-10-CM | POA: Diagnosis not present

## 2021-01-31 LAB — POCT URINALYSIS DIP (MANUAL ENTRY)
Bilirubin, UA: NEGATIVE
Glucose, UA: NEGATIVE mg/dL
Ketones, POC UA: NEGATIVE mg/dL
Nitrite, UA: POSITIVE — AB
Protein Ur, POC: NEGATIVE mg/dL
Spec Grav, UA: 1.02 (ref 1.010–1.025)
Urobilinogen, UA: 0.2 E.U./dL
pH, UA: 7 (ref 5.0–8.0)

## 2021-01-31 MED ORDER — FLUCONAZOLE 150 MG PO TABS: ORAL_TABLET | ORAL | 0 refills | Status: DC

## 2021-01-31 MED ORDER — CIPROFLOXACIN HCL 500 MG PO TABS: 500.0000 mg | ORAL_TABLET | Freq: Two times a day (BID) | ORAL | 0 refills | Status: DC

## 2021-01-31 NOTE — ED Triage Notes (Signed)
C/O malodorous, cloudy urine with bilat flank pain (R>L) and polyuria onset last night.  States did UA last wk for flank pain -- had  "Mod blood, protein, nitrates, leukocytes" - was given amoxicillin (5 days @ 500mg ) with improvement, but sxs have returned after completing course 3 days ago.  Denies fevers.

## 2021-02-02 LAB — URINE CULTURE: Culture: >=100000 — AB

## 2021-02-02 NOTE — ED Provider Notes (Signed)
[place]    ASSESSMENT & PLAN:  1. Cystitis   2. Hematuria, unspecified type    Failed amox outpt from outside provider.  Begin: Meds ordered this encounter  Medications   ciprofloxacin (CIPRO) 500 MG tablet    Sig: Take 1 tablet (500 mg total) by mouth every 12 (twelve) hours.    Dispense:  10 tablet    Refill:  0   fluconazole (DIFLUCAN) 150 MG tablet    Sig: Take one tablet by mouth as a single dose. May repeat in 3 days if symptoms persist.    Dispense:  2 tablet    Refill:  0   No signs of pyelonephritis. Urine culture sent.  Will follow up with her PCP or here if not showing improvement over the next 48 hours, sooner if needed.  Outlined signs and symptoms indicating need for more acute intervention. Patient verbalized understanding. After Visit Summary given.  SUBJECTIVE:  Stephanie Hayden is a 31 y.o. female who complains of urinary frequency, urgency and dysuria for the past week; finished Rx amox; mild help but symptoms have returned. Without associated flank pain, fever, chills, vaginal discharge or bleeding. Gross hematuria: not present. No specific aggravating or alleviating factors reported. No LE edema. Normal PO intake without n/v/d. Without specific abdominal pain. Ambulatory without difficulty.  LMP: Patient's last menstrual period was 08/21/2018.   OBJECTIVE:  Vitals:   01/31/21 1023  BP: 116/77  Pulse: 71  Resp: 16  Temp: 98.7 F (37.1 C)  TempSrc: Oral  SpO2: 99%   General appearance: alert; no distress HENT: oropharynx: moist Lungs: unlabored respirations Abdomen: soft, non-tender; bowel sounds normal; no masses or organomegaly; no guarding or rebound tenderness Back: no CVA tenderness Extremities: no edema; symmetrical with no gross deformities Skin: warm and dry Neurologic: normal gait Psychological: alert and cooperative; normal mood and affect  Labs Reviewed  URINE CULTURE - Abnormal; Notable for the following  components:      Result Value   Culture   (*)    Value: >=100,000 COLONIES/mL ESCHERICHIA COLI SUSCEPTIBILITIES TO FOLLOW Performed at [facility], [address], [place], [place] [address]    All other components within normal limits  POCT URINALYSIS DIP (MANUAL ENTRY) - Abnormal; Notable for the following components:   Color, UA light yellow (*)    Clarity, UA cloudy (*)    Blood, UA trace-intact (*)    Nitrite, UA Positive (*)    Leukocytes, UA Large (3+) (*)    All other components within normal limits    Allergies  Allergen Reactions   Coconut Flavor Anaphylaxis   Morphine And Related Anaphylaxis and Hives    Swelling of airway. Benadryl controlled reaction when she used morphine last time    Past Medical History:  Diagnosis Date   BRCA negative 04/2020   MyRisk neg   Complication of anesthesia    difficulty waking up   Endometriosis    Family history of adverse reaction to anesthesia    mother also diffucult to wake up after    Family history of breast cancer    genetic testing letter sent 5/18   GERD (gastroesophageal reflux disease)    ONLY WHEN PREGNANT   Increased risk of breast cancer 04/2020   IBIS=25.8%/riskscore=22.3%   Phlebitis following infusion    LEFT ARM-AFTER HAVING WISDOME TEETH TAKEN OUT IN APRIL 2017   Urinary tract infection    Social History   Socioeconomic History   Marital  status: Single    Spouse name: Not on file   Number of children: 1   Years of education: College   Highest education level: Not on file  Occupational History   Occupation: [employer]  Tobacco Use   Smoking status: Never   Smokeless tobacco: Never  Vaping Use   Vaping Use: Never used  Substance and Sexual Activity   Alcohol use: Not Currently    Comment: rare   Drug use: No   Sexual activity: Yes    Partners: Male    Birth control/protection: Surgical    Comment: Hysterectomy  Other Topics Concern   Not on file  Social History Narrative    Lives at home w/ her husband and daughter   Right-handed   Caffeine: coffee in the morning, tea throughout the day   Social Determinants of Health   Financial Resource Strain: Not on file  Food Insecurity: Not on file  Transportation Needs: Not on file  Physical Activity: Not on file  Stress: Not on file  Social Connections: Not on file  Intimate Partner Violence: Not on file   Family History  Problem Relation Age of Onset   Hypertension Mother    Diabetes Mother    Migraines Mother    Heart failure Mother        Coded during delivery of second child   Hypertension Father    Diabetes Father    Heart attack Father 51   Brain cancer Paternal Grandfather    Brain cancer Cousin    Breast cancer Paternal Aunt 44   Breast cancer Paternal Aunt 74   Breast cancer Paternal Aunt 68   Breast cancer Maternal Aunt 60        Vanessa Kick, MD 02/02/21 [phone]

## 2021-02-13 ENCOUNTER — Ambulatory Visit: Payer: PRIVATE HEALTH INSURANCE | Admitting: Physician Assistant

## 2021-03-12 ENCOUNTER — Ambulatory Visit (INDEPENDENT_AMBULATORY_CARE_PROVIDER_SITE_OTHER): Payer: PRIVATE HEALTH INSURANCE | Admitting: Urology

## 2021-03-12 ENCOUNTER — Other Ambulatory Visit: Payer: Self-pay

## 2021-03-12 ENCOUNTER — Ambulatory Visit: Payer: PRIVATE HEALTH INSURANCE | Admitting: Physician Assistant

## 2021-03-12 VITALS — BP 110/76 | HR 71

## 2021-03-12 DIAGNOSIS — R3989 Other symptoms and signs involving the genitourinary system: Secondary | ICD-10-CM

## 2021-03-12 DIAGNOSIS — N3021 Other chronic cystitis with hematuria: Secondary | ICD-10-CM

## 2021-03-12 LAB — BLADDER SCAN AMB NON-IMAGING: Scan Result: 92

## 2021-03-12 MED ORDER — PHENAZOPYRIDINE HCL 200 MG PO TABS: 200.0000 mg | ORAL_TABLET | Freq: Three times a day (TID) | ORAL | 2 refills | Status: DC | PRN

## 2021-03-12 MED ORDER — SULFAMETHOXAZOLE-TRIMETHOPRIM 800-160 MG PO TABS: 1.0000 | ORAL_TABLET | Freq: Every day | ORAL | 2 refills | Status: DC

## 2021-03-12 NOTE — Progress Notes (Signed)
post void residual=92 

## 2021-03-12 NOTE — Progress Notes (Signed)
Subjective: 1. Chronic cystitis with hematuria   2. Bladder pain      Consult requested by Dr. Allyn Kenner  Stephanie Hayden is a 31 female who had left flank pain in October with gross hematuria and dysuria.  She had abdominal pain and weakness but no fever.  She was given toradol and rocephin in the Urgent care.  She went home on oral antibiotic but had no imaging.  She has been on about 7 antibiotics since.  She finished cipro last week but is having recurrent symptoms and has been having recurrent UTI's since.  She had e. Coli on 1/28 that was resistant to PCN and keflex.  She had a CT AP in 10/20 for abdominal pain following a lap hysterectomy and she had no stones or other GU findings.   She has some post operative changes.   She has no incontinence.   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  She had several visits with Dr. Matilde Sprang in 2009 for painful bladder issues.  She has 1 cup of coffee in the morning but otherwise drinks a gallon of water daily.  She has used Azo with relief.    UA today is clear.   She has a Lap hysterectomy and a left SO in 2020 for endometriosis and has had 2 C-sections and multiple procedures for endometriosis prior to that.   She still has a right ovary for endocrine preservation had normal levels late last year.   She has no dysparunia or vaginal discharge.   She has frequency, urgency and burning today.   PVR is 47m.  ROS:  Review of Systems  Constitutional:  Negative for chills and fever.  Genitourinary:  Positive for dysuria, frequency and urgency.  Musculoskeletal:  Positive for back pain.  All other systems reviewed and are negative. She also reports ongoing facial puffiness.   Allergies  Allergen Reactions   Coconut Flavor Anaphylaxis   Morphine And Related Anaphylaxis and Hives    Swelling of airway. Benadryl controlled reaction when she used morphine last time    Past Medical History:  Diagnosis Date   BRCA negative  04/2020   MyRisk neg   Complication of anesthesia    difficulty waking up   Endometriosis    Family history of adverse reaction to anesthesia    mother also diffucult to wake up after    Family history of breast cancer    genetic testing letter sent 5/18   GERD (gastroesophageal reflux disease)    ONLY WHEN PREGNANT   Increased risk of breast cancer 04/2020   IBIS=25.8%/riskscore=22.3%   Phlebitis following infusion    LEFT ARM-AFTER HAVING WISDOME TEETH TAKEN OUT IN APRIL 2017   Urinary tract infection     Past Surgical History:  Procedure Laterality Date   ABDOMINAL HYSTERECTOMY     ABLATION ON ENDOMETRIOSIS  2017   CESAREAN SECTION  2011   CESAREAN SECTION WITH BILATERAL TUBAL LIGATION N/A 07/05/2017   Procedure: CESAREAN SECTION WITH BILATERAL TUBAL LIGATION;  Surgeon: JWill Bonnet MD;  Location: [facility];  Service: Obstetrics;  Laterality: N/A;   CHROMOPERTUBATION N/A 06/06/2015   Procedure: CHROMOPERTUBATION;  Surgeon: RGae Dry MD;  Location: [facility];  Service: Gynecology;  Laterality: N/A;   LAPAROSCOPY N/A 06/06/2015   Procedure: LAPAROSCOPY OPERATIVE/ EXCISION OF ENDOMETRIOSIS;  Surgeon: RGae Dry MD;  Location: [facility];  Service: Gynecology;  Laterality: N/A;   TONSILLECTOMY  TOTAL LAPAROSCOPIC HYSTERECTOMY WITH BILATERAL SALPINGO OOPHORECTOMY N/A 09/07/2018   Procedure: TOTAL LAPAROSCOPIC HYSTERECTOMY WITH BILATERAL SALPINGO;  Surgeon: Gae Dry, MD;  Location: [facility];  Service: Gynecology;  Laterality: N/A;   TOTAL LAPAROSCOPIC HYSTERECTOMY WITH SALPINGECTOMY  09/2018   with Dr. Kenton Kingfisher, endometriosis; still has bilat ovaries per path report   [place] EXTRACTION  04-2015    Social History   Socioeconomic History   Marital status: Single    Spouse name: Not on file   Number of children: 1   Years of education: College   Highest education level: Not on file  Occupational History   Occupation: [employer]   Tobacco Use   Smoking status: Never   Smokeless tobacco: Never  Vaping Use   Vaping Use: Never used  Substance and Sexual Activity   Alcohol use: Not Currently    Comment: rare   Drug use: No   Sexual activity: Yes    Partners: Male    Birth control/protection: Surgical    Comment: Hysterectomy  Other Topics Concern   Not on file  Social History Narrative   Lives at home w/ her husband and daughter   Right-handed   Caffeine: coffee in the morning, tea throughout the day   Social Determinants of Health   Financial Resource Strain: Not on file  Food Insecurity: Not on file  Transportation Needs: Not on file  Physical Activity: Not on file  Stress: Not on file  Social Connections: Not on file  Intimate Partner Violence: Not on file    Family History  Problem Relation Age of Onset   Hypertension Mother    Diabetes Mother    Migraines Mother    Heart failure Mother        Coded during delivery of second child   Hypertension Father    Diabetes Father    Heart attack Father 71   Brain cancer Paternal Grandfather    Brain cancer Cousin    Breast cancer Paternal Aunt 48   Breast cancer Paternal Aunt 67   Breast cancer Paternal Aunt 21   Breast cancer Maternal Aunt 60    Anti-infectives: Anti-infectives (From admission, onward)    Start     Dose/Rate Route Frequency Ordered Stop   03/12/21 0000  sulfamethoxazole-trimethoprim (BACTRIM DS) 800-160 MG tablet        1 tablet Oral Daily at bedtime 03/12/21 1616         Current Outpatient Medications  Medication Sig Dispense Refill   fluconazole (DIFLUCAN) 150 MG tablet Take one tablet by mouth as a single dose. May repeat in 3 days if symptoms persist. 2 tablet 0   phenazopyridine (PYRIDIUM) 200 MG tablet Take 1 tablet (200 mg total) by mouth 3 (three) times daily as needed for pain. 30 tablet 2   sertraline (ZOLOFT) 25 MG tablet Take 25 mg by mouth daily.     sulfamethoxazole-trimethoprim (BACTRIM DS) 800-160 MG  tablet Take 1 tablet by mouth at bedtime. 30 tablet 2   ciprofloxacin (CIPRO) 500 MG tablet Take 1 tablet (500 mg total) by mouth every 12 (twelve) hours. (Patient not taking: Reported on 03/12/2021) 10 tablet 0   No current facility-administered medications for this visit.     Objective: Vital signs in last 24 hours: BP 110/76    Pulse 71    LMP 08/21/2018   Intake/Output from previous day: No intake/output data recorded. Intake/Output this shift: _0 @   Physical  Exam Vitals reviewed.  Constitutional:      Appearance: Normal appearance.  Abdominal:     General: Abdomen is flat.     Palpations: Abdomen is soft. There is no mass.     Tenderness: There is no abdominal tenderness.     Comments: Well healed phannensteil incision.   Genitourinary:    Comments: Nl external genitalia. Urethral meatus is normal. Urethra non-tender without hypermobility. Bladder is tender with slight fullness. No adnexal mass. S/p hysterectomy. Mild vaginal atrophy without prolapse. Scant D/c.  Musculoskeletal:        General: No swelling or tenderness. Normal range of motion.  Skin:    General: Skin is warm and dry.  Neurological:     General: No focal deficit present.     Mental Status: She is alert and oriented to person, place, and time.  Psychiatric:        Mood and Affect: Mood normal.        Behavior: Behavior normal.    Lab Results:  Results for orders placed or performed in visit on 03/12/21 (from the past 24 hour(s))  Urinalysis, Routine w reflex microscopic     Status: None   Collection Time: 03/12/21  4:21 PM  Result Value Ref Range   Specific Gravity, UA 1.020 1.005 - 1.030   pH, UA 7.5 5.0 - 7.5   Color, UA Yellow Yellow   Appearance Ur Clear Clear   Leukocytes,UA Negative Negative   Protein,UA Negative Negative/Trace   Glucose, UA Negative Negative   Ketones, UA Negative Negative   RBC, UA Negative Negative   Bilirubin, UA Negative Negative   Urobilinogen, Ur  1.0 0.2 - 1.0 mg/dL   Nitrite, UA Negative Negative   Microscopic Examination Comment    Narrative   Performed at:  [facility] [address], [place], [place]  [id number] Lab Director: Mina Marble [place], Phone:  [phone]    BMET No results for input(s): NA, K, CL, CO2, GLUCOSE, BUN, CREATININE, CALCIUM in the last 72 hours. PT/INR No results for input(s): LABPROT, INR in the last 72 hours. ABG No results for input(s): PHART, HCO3 in the last 72 hours.  Invalid input(s): PCO2, PO2 Outside records and prior cultures and labs reviewed.   Studies/Results: No results found. CT from 2020 films and report reviewed.    Assessment/Plan:  Recurrent UTI's/chronic cystitis with hematuria and a remote history of IC.   She remains symptomatic today despite a clear urine and has a tender bladder on exam.   With the hematuria and her history of endometriosis and a retained ovary, I will get a CT hematuria study to assess for anatomic abnormalities and will have her return for cystoscopy.  In the meantime, I will put her on suppressive therapy with bactrim ds nightly and pyridium for symptom relief.   BMP to assess renal function.   Meds ordered this encounter  Medications   sulfamethoxazole-trimethoprim (BACTRIM DS) 800-160 MG tablet    Sig: Take 1 tablet by mouth at bedtime.    Dispense:  30 tablet    Refill:  2   phenazopyridine (PYRIDIUM) 200 MG tablet    Sig: Take 1 tablet (200 mg total) by mouth 3 (three) times daily as needed for pain.    Dispense:  30 tablet    Refill:  2     Orders Placed This Encounter  Procedures   CT HEMATURIA WORKUP    Standing Status:   Future    Standing  Expiration Date:   03/13/2022    Order Specific Question:   Reason for Exam (SYMPTOM  OR DIAGNOSIS REQUIRED)    Answer:   Hematuria with recurrent UTI's and hisotry of endometriosis    Order Specific Question:   Preferred imaging location?    Answer:   [facility]    Order Specific  Question:   Radiology Contrast Protocol - do NOT remove file path    Answer:   [email or web address]    Order Specific Question:   Is patient pregnant?    Answer:   No   Basic metabolic panel   Urinalysis, Routine w reflex microscopic   BLADDER SCAN AMB NON-IMAGING     Return for ASAP with CT results for cystoscopy. .    CC: Dr. Wende Neighbors.      Irine Seal 03/13/2021 [phone]

## 2021-03-13 ENCOUNTER — Encounter: Payer: Self-pay | Admitting: Urology

## 2021-03-13 LAB — URINALYSIS, ROUTINE W REFLEX MICROSCOPIC
Bilirubin, UA: NEGATIVE
Glucose, UA: NEGATIVE
Ketones, UA: NEGATIVE
Leukocytes,UA: NEGATIVE
Nitrite, UA: NEGATIVE
Protein,UA: NEGATIVE
RBC, UA: NEGATIVE
Specific Gravity, UA: 1.02 (ref 1.005–1.030)
Urobilinogen, Ur: 1 mg/dL (ref 0.2–1.0)
pH, UA: 7.5 (ref 5.0–7.5)

## 2021-03-14 LAB — BASIC METABOLIC PANEL
BUN/Creatinine Ratio: 16 (ref 9–23)
BUN: 13 mg/dL (ref 6–20)
CO2: 23 mmol/L (ref 20–29)
Calcium: 9.4 mg/dL (ref 8.7–10.2)
Chloride: 102 mmol/L (ref 96–106)
Creatinine, Ser: 0.8 mg/dL (ref 0.57–1.00)
Glucose: 84 mg/dL (ref 70–99)
Potassium: 4.1 mmol/L (ref 3.5–5.2)
Sodium: 140 mmol/L (ref 134–144)
eGFR: 100 mL/min/{1.73_m2} (ref >59)

## 2021-03-21 ENCOUNTER — Other Ambulatory Visit: Payer: Self-pay

## 2021-03-21 ENCOUNTER — Ambulatory Visit (HOSPITAL_BASED_OUTPATIENT_CLINIC_OR_DEPARTMENT_OTHER)
Admission: RE | Admit: 2021-03-21 | Discharge: 2021-03-21 | Disposition: A | Discharge status: Home / Self Care | Payer: PRIVATE HEALTH INSURANCE | Source: Ambulatory Visit | Attending: Urology | Admitting: Urology

## 2021-03-21 DIAGNOSIS — N3021 Other chronic cystitis with hematuria: Secondary | ICD-10-CM

## 2021-03-21 MED ORDER — IOHEXOL 300 MG/ML  SOLN
100.0000 mL | Freq: Once | INTRAMUSCULAR | Status: AC | PRN
  Administered 2021-03-21: 100 mL via INTRAVENOUS

## 2021-04-02 ENCOUNTER — Encounter: Payer: Self-pay | Admitting: Urology

## 2021-04-02 ENCOUNTER — Ambulatory Visit (INDEPENDENT_AMBULATORY_CARE_PROVIDER_SITE_OTHER): Payer: PRIVATE HEALTH INSURANCE | Admitting: Urology

## 2021-04-02 VITALS — BP 109/71 | HR 75

## 2021-04-02 DIAGNOSIS — R3989 Other symptoms and signs involving the genitourinary system: Secondary | ICD-10-CM

## 2021-04-02 DIAGNOSIS — N3021 Other chronic cystitis with hematuria: Secondary | ICD-10-CM | POA: Diagnosis not present

## 2021-04-02 MED ORDER — CIPROFLOXACIN HCL 500 MG PO TABS
500.0000 mg | ORAL_TABLET | Freq: Once | ORAL | Status: AC
  Administered 2021-04-02: 500 mg via ORAL

## 2021-04-02 NOTE — Progress Notes (Signed)
?Subjective: ?1. Chronic cystitis with hematuria   ?2. Bladder pain   ?  ?04/02/21: Stephanie Hayden returns today in f/u.  A CT hematuria study on 03/21/21 showed no GU findings but she did have an ovarian cyst.  She is for cystoscopy today.  She was started on suppressive bactrim and prn pyridium at her last visit.  She has no UTI symptoms but still has some bladder pain.  She continues to have some blood on a dip UA.  ? ? ?03/12/21: Stephanie Hayden is a 31 female who had left flank pain in October with gross hematuria and dysuria.  She had abdominal pain and weakness but no fever.  She was given toradol and rocephin in the Urgent care.  She went home on oral antibiotic but had no imaging.  She has been on about 7 antibiotics since.  She finished cipro last week but is having recurrent symptoms and has been having recurrent UTI's since.  She had e. Coli on 1/28 that was resistant to PCN and keflex.  She had a CT AP in 10/20 for abdominal pain following a lap hysterectomy and she had no stones or other GU findings.   She has some post operative changes.   She has no incontinence.   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  She had several visits with Dr. Matilde Sprang in 2009 for painful bladder issues.  She has 1 cup of coffee in the morning but otherwise drinks a gallon of water daily.  She has used Azo with relief.    UA today is clear.   She has a Lap hysterectomy and a left SO in 2020 for endometriosis and has had 2 C-sections and multiple procedures for endometriosis prior to that.   She still has a right ovary for endocrine preservation had normal levels late last year.   She has no dysparunia or vaginal discharge.   She has frequency, urgency and burning today.   PVR is 34m.  ?ROS: ? ?ROS ?She also reports ongoing facial puffiness.  ? ?Allergies  ?Allergen Reactions  ? Coconut Flavor Anaphylaxis  ? Morphine And Related Anaphylaxis and Hives  ?  Swelling of airway. Benadryl controlled reaction when  she used morphine last time  ? ? ?Past Medical History:  ?Diagnosis Date  ? BRCA negative 04/2020  ? MyRisk neg  ? Complication of anesthesia   ? difficulty waking up  ? Endometriosis   ? Family history of adverse reaction to anesthesia   ? mother also diffucult to wake up after   ? Family history of breast cancer   ? genetic testing letter sent 5/18  ? GERD (gastroesophageal reflux disease)   ? ONLY WHEN PREGNANT  ? Increased risk of breast cancer 04/2020  ? IBIS=25.8%/riskscore=22.3%  ? Phlebitis following infusion   ? LEFT ARM-AFTER HAVING WISDOME TEETH TAKEN OUT IN APRIL 2017  ? Urinary tract infection   ? ? ?Past Surgical History:  ?Procedure Laterality Date  ? ABDOMINAL HYSTERECTOMY    ? ABLATION ON ENDOMETRIOSIS  2017  ? CESAREAN SECTION  2011  ? CESAREAN SECTION WITH BILATERAL TUBAL LIGATION N/A 07/05/2017  ? Procedure: CESAREAN SECTION WITH BILATERAL TUBAL LIGATION;  Surgeon: JWill Bonnet MD;  Location: [facility];  Service: Obstetrics;  Laterality: N/A;  ? CHROMOPERTUBATION N/A 06/06/2015  ? Procedure: CHROMOPERTUBATION;  Surgeon: RGae Dry MD;  Location: [facility];  Service: Gynecology;  Laterality: N/A;  ? LAPAROSCOPY N/A  06/06/2015  ? Procedure: LAPAROSCOPY OPERATIVE/ EXCISION OF ENDOMETRIOSIS;  Surgeon: Gae Dry, MD;  Location: [facility];  Service: Gynecology;  Laterality: N/A;  ? TONSILLECTOMY    ? TOTAL LAPAROSCOPIC HYSTERECTOMY WITH BILATERAL SALPINGO OOPHORECTOMY N/A 09/07/2018  ? Procedure: TOTAL LAPAROSCOPIC HYSTERECTOMY WITH BILATERAL SALPINGO;  Surgeon: Gae Dry, MD;  Location: [facility];  Service: Gynecology;  Laterality: N/A;  ? TOTAL LAPAROSCOPIC HYSTERECTOMY WITH SALPINGECTOMY  09/2018  ? with Dr. Kenton Kingfisher, endometriosis; still has bilat ovaries per path report  ? TUBAL LIGATION    ? WISDOM TOOTH EXTRACTION  04-2015  ? ? ?Social History  ? ?Socioeconomic History  ? Marital status: Single  ?  Spouse name: Not on file  ? Number of children: 1  ? Years of education: College  ?  Highest education level: Not on file  ?Occupational History  ? Occupation: [employer]  ?Tobacco Use  ? Smoking status: Never  ? Smokeless tobacco: Never  ?Vaping Use  ? Vaping Use: Never used  ?Substance and Sexual Activity  ? Alcohol use: Not Currently  ?  Comment: rare  ? Drug use: No  ? Sexual activity: Yes  ?  Partners: Male  ?  Birth control/protection: Surgical  ?  Comment: Hysterectomy  ?Other Topics Concern  ? Not on file  ?Social History Narrative  ? Lives at home w/ her husband and daughter  ? Right-handed  ? Caffeine: coffee in the morning, tea throughout the day  ? ?Social Determinants of Health  ? ?Financial Resource Strain: Not on file  ?Food Insecurity: Not on file  ?Transportation Needs: Not on file  ?Physical Activity: Not on file  ?Stress: Not on file  ?Social Connections: Not on file  ?Intimate Partner Violence: Not on file  ? ? ?Family History  ?Problem Relation Age of Onset  ? Hypertension Mother   ? Diabetes Mother   ? Migraines Mother   ? Heart failure Mother   ?     Coded during delivery of second child  ? Hypertension Father   ? Diabetes Father   ? Heart attack Father 60  ? Brain cancer Paternal Grandfather   ? Brain cancer Cousin   ? Breast cancer Paternal Aunt 33  ? Breast cancer Paternal Aunt 46  ? Breast cancer Paternal Aunt 50  ? Breast cancer Maternal Aunt 60  ? ? ?Anti-infectives: ?Anti-infectives (From admission, onward)  ? ? Start     Dose/Rate Route Frequency Ordered Stop  ? 04/02/21 1145  CIPROFLOXACIN HCL 500 MG PO TABS       ? 500 mg Oral  Once 04/02/21 1135 04/02/21 1206  ? ?  ? ? ?Current Outpatient Medications  ?Medication Sig Dispense Refill  ? phenazopyridine (PYRIDIUM) 200 MG tablet Take 1 tablet (200 mg total) by mouth 3 (three) times daily as needed for pain. 30 tablet 2  ? sertraline (ZOLOFT) 25 MG tablet Take 25 mg by mouth daily.    ? sulfamethoxazole-trimethoprim (BACTRIM DS) 800-160 MG tablet Take 1 tablet by mouth at bedtime. 30 tablet 2  ? WEGOVY 0.25  MG/0.5ML SOAJ Inject into the skin.    ? ?No current facility-administered medications for this visit.  ? ? ? ?Objective: ?Vital signs in last 24 hours: ?BP 109/71   Pulse 75   LMP 08/21/2018  ? ?Intake/Output from previous day: ?No intake/output data recorded. ?Intake/Output this shift: ?'@IOTHISSHIFT' @ ? ? ?Physical Exam ? ?Lab Results:  ?Results for orders placed or performed in visit on  04/02/21 (from the past 24 hour(s))  ?Urinalysis, Routine w reflex microscopic     Status: Abnormal  ? Collection Time: 04/02/21 11:55 AM  ?Result Value Ref Range  ? Specific Gravity, UA 1.015 1.005 - 1.030  ? pH, UA 6.0 5.0 - 7.5  ? Color, UA Yellow Yellow  ? Appearance Ur Clear Clear  ? Leukocytes,UA Negative Negative  ? Protein,UA Negative Negative/Trace  ? Glucose, UA Negative Negative  ? Ketones, UA Negative Negative  ? RBC, UA Trace (A) Negative  ? Bilirubin, UA Negative Negative  ? Urobilinogen, Ur 0.2 0.2 - 1.0 mg/dL  ? Nitrite, UA Negative Negative  ? Microscopic Examination See below:   ? Narrative  ? Performed at:  [facility] ?[address] [place], [place]  [id number] ?Lab Director: [place], Phone:  [phone]  ?Microscopic Examination     Status: Abnormal  ? Collection Time: 04/02/21 11:55 AM  ? Urine  ?Result Value Ref Range  ? WBC, UA 0-5 0 - 5 /hpf  ? RBC 0-2 0 - 2 /hpf  ? Epithelial Cells (non renal) 0-10 0 - 10 /hpf  ? Renal Epithel, UA None seen None seen /hpf  ? Bacteria, UA Few (A) None seen/Few  ? Narrative  ? Performed at:  [facility] ?[address] [place], [place]  [id number] ?Lab Director: [place], Phone:  [phone]  ? ?  ?BMET ?No results for input(s): NA, K, CL, CO2, GLUCOSE, BUN, CREATININE, CALCIUM in the last 72 hours. ?PT/INR ?No results for input(s): LABPROT, INR in the last 72 hours. ?ABG ?No results for input(s): PHART, HCO3 in the last 72 hours. ? ?Invalid input(s): PCO2, PO2 ?Outside records and prior cultures and labs reviewed.    ?Studies/Results: ?No results found. ?CT HEMATURIA WORKUP ? ?Result Date: 03/23/2021 ?CLINICAL DATA:  Hematuria and recurrent urinary tract infections. History of endometriosis. Partial hysterectomy. EXAM: CT ABDOMEN A

## 2021-04-03 LAB — URINALYSIS, ROUTINE W REFLEX MICROSCOPIC
Bilirubin, UA: NEGATIVE
Glucose, UA: NEGATIVE
Ketones, UA: NEGATIVE
Leukocytes,UA: NEGATIVE
Nitrite, UA: NEGATIVE
Protein,UA: NEGATIVE
Specific Gravity, UA: 1.015 (ref 1.005–1.030)
Urobilinogen, Ur: 0.2 mg/dL (ref 0.2–1.0)
pH, UA: 6 (ref 5.0–7.5)

## 2021-04-03 LAB — MICROSCOPIC EXAMINATION: Renal Epithel, UA: NONE SEEN /hpf

## 2021-06-02 ENCOUNTER — Telehealth: Payer: Self-pay

## 2021-06-02 DIAGNOSIS — N3021 Other chronic cystitis with hematuria: Secondary | ICD-10-CM

## 2021-06-02 NOTE — Telephone Encounter (Signed)
Patient called advising that she was to contact our office if she experience any joint pain with the current medication she is on. She advised she is experiencing hip pain.  She wanted to know if the alternative medication could be called into [facility] in [place].    Thank you!

## 2021-06-05 MED ORDER — NITROFURANTOIN MONOHYD MACRO 100 MG PO CAPS: 100.0000 mg | ORAL_CAPSULE | Freq: Every day | ORAL | 3 refills | Status: DC

## 2021-06-05 NOTE — Telephone Encounter (Signed)
Rx sent in. Patient called and made aware. 

## 2021-06-09 ENCOUNTER — Other Ambulatory Visit: Payer: PRIVATE HEALTH INSURANCE

## 2021-06-09 ENCOUNTER — Telehealth: Payer: Self-pay

## 2021-06-09 DIAGNOSIS — N3021 Other chronic cystitis with hematuria: Secondary | ICD-10-CM

## 2021-06-09 NOTE — Telephone Encounter (Signed)
Patient called advising she needs a refill on medication. Patient advised she has had to double dose to see results.    Medication: sulfamethoxazole-trimethoprim (BACTRIM DS) 800-160 MG tablet     Pharmacy: [facility], [place] [address] [place], [place] - [address]

## 2021-06-10 ENCOUNTER — Other Ambulatory Visit: Payer: Self-pay

## 2021-06-10 LAB — CBC
Hematocrit: 45.5 % (ref 34.0–46.6)
Hemoglobin: 15.3 g/dL (ref 11.1–15.9)
MCH: 30.9 pg (ref 26.6–33.0)
MCHC: 33.6 g/dL (ref 31.5–35.7)
MCV: 92 fL (ref 79–97)
Platelets: 295 10*3/uL (ref 150–450)
RBC: 4.95 x10E6/uL (ref 3.77–5.28)
RDW: 12.2 % (ref 11.7–15.4)
WBC: 7.9 10*3/uL (ref 3.4–10.8)

## 2021-06-10 NOTE — Telephone Encounter (Signed)
Patient asking for Diflucan to help abx therapy. She states she needs two doses of diflucan

## 2021-06-11 MED ORDER — FLUCONAZOLE 150 MG PO TABS
150.0000 mg | ORAL_TABLET | Freq: Once | ORAL | 0 refills | Status: AC
Start: 2021-06-11 — End: 2021-06-11

## 2021-06-11 NOTE — Telephone Encounter (Signed)
Patient called and notified. Prescription sent to pharmacy.

## 2021-07-02 ENCOUNTER — Ambulatory Visit: Payer: PRIVATE HEALTH INSURANCE | Admitting: Urology

## 2021-12-13 ENCOUNTER — Ambulatory Visit
Admission: EM | Admit: 2021-12-13 | Discharge: 2021-12-13 | Discharge status: Against Medical Advice | Payer: Commercial Managed Care - PPO

## 2022-01-11 ENCOUNTER — Ambulatory Visit (INDEPENDENT_AMBULATORY_CARE_PROVIDER_SITE_OTHER): Payer: Commercial Managed Care - PPO | Admitting: Obstetrics and Gynecology

## 2022-01-11 ENCOUNTER — Encounter: Payer: Self-pay | Admitting: Obstetrics and Gynecology

## 2022-01-11 VITALS — BP 104/70 | Ht 67.0 in | Wt 187.0 lb

## 2022-01-11 DIAGNOSIS — B9689 Other specified bacterial agents as the cause of diseases classified elsewhere: Secondary | ICD-10-CM

## 2022-01-11 DIAGNOSIS — N390 Urinary tract infection, site not specified: Secondary | ICD-10-CM

## 2022-01-11 DIAGNOSIS — Z87440 Personal history of urinary (tract) infections: Secondary | ICD-10-CM | POA: Diagnosis not present

## 2022-01-11 DIAGNOSIS — N760 Acute vaginitis: Secondary | ICD-10-CM

## 2022-01-11 DIAGNOSIS — L290 Pruritus ani: Secondary | ICD-10-CM

## 2022-01-11 LAB — POCT URINALYSIS DIPSTICK
Bilirubin, UA: NEGATIVE
Blood, UA: NEGATIVE
Glucose, UA: NEGATIVE
Ketones, UA: NEGATIVE
Leukocytes, UA: NEGATIVE
Nitrite, UA: NEGATIVE
Protein, UA: NEGATIVE
Spec Grav, UA: 1.02 (ref 1.010–1.025)
pH, UA: 6 (ref 5.0–8.0)

## 2022-01-11 LAB — POCT WET PREP WITH KOH
KOH Prep POC: NEGATIVE
Trichomonas, UA: NEGATIVE
Yeast Wet Prep HPF POC: NEGATIVE

## 2022-01-11 MED ORDER — TRIMETHOPRIM 100 MG PO TABS: 100.0000 mg | ORAL_TABLET | Freq: Once | ORAL | 0 refills | Status: DC | PRN

## 2022-01-11 MED ORDER — METRONIDAZOLE 500 MG PO TABS: ORAL_TABLET | ORAL | 0 refills | Status: DC

## 2022-01-11 MED ORDER — FLUCONAZOLE 150 MG PO TABS: 150.0000 mg | ORAL_TABLET | Freq: Once | ORAL | 0 refills | Status: AC

## 2022-01-11 NOTE — Patient Instructions (Signed)
I value your feedback and you entrusting us with your care. If you get a [facility] patient survey, I would appreciate you taking the time to let us know about your experience today. Thank you! ? ? ?

## 2022-01-11 NOTE — Progress Notes (Signed)
Celene Squibb, MD   Chief Complaint  Patient presents with   Vaginal Discharge    Foul odor, no itching or irritation x 3 days   Urinary Tract Infection    Painful urination, no frequency or blood x 1 day    HPI:      [name] is a 32 y.o. [id number] whose LMP was Patient's last menstrual period was 08/21/2018., presents today for increased vag d/c with odor for 3 days; feels raw vaginally, dysuria started today. No other UTI sx. Hx of BV in distant past. Was on abx for a UTI a few wks ago, already treated with diflucan then. Hx of recurrent UTIs, saw urology in past with 6 months of abx use. This worked until 11/23; has f/u appt with urology later this month. Sex seems to be UTI trigger, hasn't tried PC abx. No new sexual partners. No new soaps/detergents. Has also had perianal itching/irritation.  S/p lap hyst   Patient Active Problem List   Diagnosis Date Noted   Recurrent UTI 01/11/2022   Family history of breast cancer 04/22/2020   S/P laparoscopic hysterectomy 09/15/2018   Pelvic pain 16/07/3708   Dysmetabolic syndrome 62/69/4854   Over weight 04/10/2018   Obesity (BMI 30-39.9) 02/08/2018   Syncope 06/22/2017   History of cesarean section 02/01/2017   Dysmenorrhea 09/28/2016   Headache syndrome 01/19/2016   Endometriosis 06/06/2015    Past Surgical History:  Procedure Laterality Date   ABDOMINAL HYSTERECTOMY     ABLATION ON ENDOMETRIOSIS  2017   CESAREAN SECTION  2011   CESAREAN SECTION WITH BILATERAL TUBAL LIGATION N/A 07/05/2017   Procedure: CESAREAN SECTION WITH BILATERAL TUBAL LIGATION;  Surgeon: Will Bonnet, MD;  Location: [facility];  Service: Obstetrics;  Laterality: N/A;   CHROMOPERTUBATION N/A 06/06/2015   Procedure: CHROMOPERTUBATION;  Surgeon: Gae Dry, MD;  Location: [facility];  Service: Gynecology;  Laterality: N/A;   LAPAROSCOPY N/A 06/06/2015   Procedure: LAPAROSCOPY OPERATIVE/ EXCISION OF ENDOMETRIOSIS;  Surgeon: Gae Dry, MD;   Location: [facility];  Service: Gynecology;  Laterality: N/A;   TONSILLECTOMY     TOTAL LAPAROSCOPIC HYSTERECTOMY WITH BILATERAL SALPINGO OOPHORECTOMY N/A 09/07/2018   Procedure: TOTAL LAPAROSCOPIC HYSTERECTOMY WITH BILATERAL SALPINGO;  Surgeon: Gae Dry, MD;  Location: [facility];  Service: Gynecology;  Laterality: N/A;   TOTAL LAPAROSCOPIC HYSTERECTOMY WITH SALPINGECTOMY  09/2018   with Dr. Kenton Kingfisher, endometriosis; still has bilat ovaries per path report   TUBAL LIGATION     WISDOM TOOTH EXTRACTION  04-2015    Family History  Problem Relation Age of Onset   Hypertension Mother    Diabetes Mother    Migraines Mother    Heart failure Mother        Coded during delivery of second child   Hypertension Father    Diabetes Father    Heart attack Father 37   Brain cancer Paternal Grandfather    Brain cancer Cousin    Breast cancer Paternal Aunt 50   Breast cancer Paternal Aunt 93   Breast cancer Paternal Aunt 39   Breast cancer Maternal Aunt 60    Social History   Socioeconomic History   Marital status: Single    Spouse name: Not on file   Number of children: 1   Years of education: College   Highest education level: Not on file  Occupational History   Occupation: [employer]  Tobacco Use   Smoking status: Never  Smokeless tobacco: Never  Vaping Use   Vaping Use: Never used  Substance and Sexual Activity   Alcohol use: Not Currently    Comment: rare   Drug use: No   Sexual activity: Yes    Partners: Male    Birth control/protection: Surgical    Comment: Hysterectomy  Other Topics Concern   Not on file  Social History Narrative   Lives at home w/ her husband and daughter   Right-handed   Caffeine: coffee in the morning, tea throughout the day   Social Determinants of Health   Financial Resource Strain: Not on file  Food Insecurity: Not on file  Transportation Needs: Not on file  Physical Activity: Not on file  Stress: Not on file  Social Connections:  Not on file  Intimate Partner Violence: Not on file    Outpatient Medications Prior to Visit  Medication Sig Dispense Refill   traZODone (DESYREL) 50 MG tablet Take 50 mg by mouth at bedtime.     nitrofurantoin, macrocrystal-monohydrate, (MACROBID) 100 MG capsule Take 1 capsule (100 mg total) by mouth at bedtime. 30 capsule 3   phenazopyridine (PYRIDIUM) 200 MG tablet Take 1 tablet (200 mg total) by mouth 3 (three) times daily as needed for pain. 30 tablet 2   sertraline (ZOLOFT) 25 MG tablet Take 25 mg by mouth daily.     WEGOVY 0.25 MG/0.5ML SOAJ Inject into the skin.     No facility-administered medications prior to visit.      ROS:  Review of Systems  Constitutional:  Negative for fever.  Gastrointestinal:  Negative for blood in stool, constipation, diarrhea, nausea and vomiting.  Genitourinary:  Positive for dysuria and vaginal discharge. Negative for dyspareunia, flank pain, frequency, hematuria, urgency, vaginal bleeding and vaginal pain.  Musculoskeletal:  Negative for back pain.  Skin:  Negative for rash.   BREAST: No symptoms   OBJECTIVE:   Vitals:  BP 104/70   Ht '5\' 7"'$  (1.702 m)   Wt 187 lb (84.8 kg)   LMP 08/21/2018   BMI 29.29 kg/m   Physical Exam Vitals reviewed.  Constitutional:      Appearance: She is well-developed.  Pulmonary:     Effort: Pulmonary effort is normal.  Genitourinary:    General: Normal vulva.     Pubic Area: No rash.      Labia:        Right: No rash, tenderness or lesion.        Left: No rash, tenderness or lesion.      Vagina: Vaginal discharge present. No erythema or tenderness.     Uterus: Absent.      Adnexa: Right adnexa normal and left adnexa normal.       Right: No mass or tenderness.         Left: No mass or tenderness.      Musculoskeletal:        General: Normal range of motion.     Cervical back: Normal range of motion.  Skin:    General: Skin is warm and dry.  Neurological:     General: No focal deficit  present.     Mental Status: She is alert and oriented to person, place, and time.  Psychiatric:        Mood and Affect: Mood normal.        Behavior: Behavior normal.        Thought Content: Thought content normal.        Judgment: Judgment normal.  Results: Results for orders placed or performed in visit on 01/11/22 (from the past 24 hour(s))  POCT Urinalysis Dipstick     Status: Normal   Collection Time: 01/11/22  3:11 PM  Result Value Ref Range   Color, UA YELLOW    Clarity, UA clear    Glucose, UA Negative Negative   Bilirubin, UA neg    Ketones, UA neg    Spec Grav, UA 1.020 1.010 - 1.025   Blood, UA neg    pH, UA 6.0 5.0 - 8.0   Protein, UA Negative Negative   Urobilinogen, UA     Nitrite, UA neg    Leukocytes, UA Negative Negative   Appearance     Odor    POCT Wet Prep with KOH     Status: Abnormal   Collection Time: 01/11/22  3:12 PM  Result Value Ref Range   Trichomonas, UA Negative    Clue Cells Wet Prep HPF POC few    Epithelial Wet Prep HPF POC     Yeast Wet Prep HPF POC neg    Bacteria Wet Prep HPF POC     RBC Wet Prep HPF POC     WBC Wet Prep HPF POC     KOH Prep POC Negative Negative     Assessment/Plan: BV (bacterial vaginosis) - Plan: metroNIDAZOLE (FLAGYL) 500 MG tablet, POCT Wet Prep with KOH; pos sx and exam, Rx flagyl, no EtOH. F/u prn.   Perianal itch - Plan: fluconazole (DIFLUCAN) 150 MG tablet; looks fungal. Rx diflucan. If sx persist, will try OTC clotrimazole crm. F/u prn.   Recurrent UTI - Plan: trimethoprim (TRIMPEX) 100 MG tablet, POCT Urinalysis Dipstick; neg UA today. Dysuria most likely due to vaginitis sx. Rx trimethoprim after sex as preventive; pt has f/u with urology later this month. F/u prn.    Meds ordered this encounter  Medications   metroNIDAZOLE (FLAGYL) 500 MG tablet    Sig: Take 1 tab BID for 7 days; NO alcohol use for 10 days after prescription start    Dispense:  14 tablet    Refill:  0    Order Specific  Question:   Supervising Provider    Answer:   Rubie Maid [[id number]]   fluconazole (DIFLUCAN) 150 MG tablet    Sig: Take 1 tablet (150 mg total) by mouth once for 1 dose.    Dispense:  1 tablet    Refill:  0    Order Specific Question:   Supervising Provider    Answer:   Rubie Maid [[id number]]   trimethoprim (TRIMPEX) 100 MG tablet    Sig: Take 1 tablet (100 mg total) by mouth once as needed for up to 1 dose.    Dispense:  30 tablet    Refill:  0    Order Specific Question:   Supervising Provider    Answer:   Rubie Maid [[id number]]      Return in about 4 weeks (around 02/08/2022), or if symptoms worsen or fail to improve, for annual.  Ayat Drenning B. Montgomery Rothlisberger, PA-C 01/11/2022 3:13 PM

## 2022-02-17 DIAGNOSIS — Z9189 Other specified personal risk factors, not elsewhere classified: Secondary | ICD-10-CM | POA: Insufficient documentation

## 2022-02-17 NOTE — Progress Notes (Deleted)
PCP:  Celene Squibb, MD   No chief complaint on file.    HPI:      [name] is a 32 y.o. [id number] whose LMP was Patient's last menstrual period was 08/21/2018., presents today for her annual examination.  Her menses are absent due to total lap hyst BS 9/20 due to endometriosis??  Sex activity: {sex active: 315163}.  Last Pap: 12/22/20 Results were: no abnormalities /neg HPV DNA  Hx of STDs: {STD hx:14358}  Last mammogram: 04/28/20  Results were: normal--routine follow-up in 12 months/neg breast MRI 5/22 There is a FH of breast cancer. There is no FH of ovarian cancer. The patient {does:18564} do self-breast exams. Pt is MyRisk  2022neg/increased risk of breast cancer  Tobacco use: {tob:20664} Alcohol use: {Alcohol:11675} No drug use.  Exercise: {exercise:31265}  She {does:18564} get adequate calcium and Vitamin D in her diet.  Patient Active Problem List   Diagnosis Date Noted   Recurrent UTI 01/11/2022   Family history of breast cancer 04/22/2020   S/P laparoscopic hysterectomy 09/15/2018   Pelvic pain [id number]   Dysmetabolic syndrome [id number]   Over weight 04/10/2018   Obesity (BMI 30-39.9) 02/08/2018   Syncope 06/22/2017   History of cesarean section 02/01/2017   Dysmenorrhea 09/28/2016   Headache syndrome 01/19/2016   Endometriosis 06/06/2015    Past Surgical History:  Procedure Laterality Date   ABDOMINAL HYSTERECTOMY     ABLATION ON ENDOMETRIOSIS  2017   CESAREAN SECTION  2011   CESAREAN SECTION WITH BILATERAL TUBAL LIGATION N/A 07/05/2017   Procedure: CESAREAN SECTION WITH BILATERAL TUBAL LIGATION;  Surgeon: Will Bonnet, MD;  Location: [facility];  Service: Obstetrics;  Laterality: N/A;   CHROMOPERTUBATION N/A 06/06/2015   Procedure: CHROMOPERTUBATION;  Surgeon: Gae Dry, MD;  Location: [facility];  Service: Gynecology;  Laterality: N/A;   LAPAROSCOPY N/A 06/06/2015   Procedure: LAPAROSCOPY OPERATIVE/ EXCISION OF ENDOMETRIOSIS;  Surgeon:  Gae Dry, MD;  Location: [facility];  Service: Gynecology;  Laterality: N/A;   TONSILLECTOMY     TOTAL LAPAROSCOPIC HYSTERECTOMY WITH BILATERAL SALPINGO OOPHORECTOMY N/A 09/07/2018   Procedure: TOTAL LAPAROSCOPIC HYSTERECTOMY WITH BILATERAL SALPINGO;  Surgeon: Gae Dry, MD;  Location: [facility];  Service: Gynecology;  Laterality: N/A;   TOTAL LAPAROSCOPIC HYSTERECTOMY WITH SALPINGECTOMY  09/2018   with Dr. Kenton Kingfisher, endometriosis; still has bilat ovaries per path report   TUBAL LIGATION     WISDOM TOOTH EXTRACTION  04-2015    Family History  Problem Relation Age of Onset   Hypertension Mother    Diabetes Mother    Migraines Mother    Heart failure Mother        Coded during delivery of second child   Hypertension Father    Diabetes Father    Heart attack Father 20   Brain cancer Paternal Grandfather    Brain cancer Cousin    Breast cancer Paternal Aunt 64   Breast cancer Paternal Aunt 40   Breast cancer Paternal Aunt 53   Breast cancer Maternal Aunt 60    Social History   Socioeconomic History   Marital status: Single    Spouse name: Not on file   Number of children: 1   Years of education: College   Highest education level: Not on file  Occupational History   Occupation: [employer]  Tobacco Use   Smoking status: Never   Smokeless tobacco: Never  Vaping Use   Vaping Use: Never used  Substance and  Sexual Activity   Alcohol use: Not Currently    Comment: rare   Drug use: No   Sexual activity: Yes    Partners: Male    Birth control/protection: Surgical    Comment: Hysterectomy  Other Topics Concern   Not on file  Social History Narrative   Lives at home w/ her husband and daughter   Right-handed   Caffeine: coffee in the morning, tea throughout the day   Social Determinants of Health   Financial Resource Strain: Not on file  Food Insecurity: Not on file  Transportation Needs: Not on file  Physical Activity: Not on file  Stress: Not on file   Social Connections: Not on file  Intimate Partner Violence: Not on file     Current Outpatient Medications:    metroNIDAZOLE (FLAGYL) 500 MG tablet, Take 1 tab BID for 7 days; NO alcohol use for 10 days after prescription start, Disp: 14 tablet, Rfl: 0   traZODone (DESYREL) 50 MG tablet, Take 50 mg by mouth at bedtime., Disp: , Rfl:    trimethoprim (TRIMPEX) 100 MG tablet, Take 1 tablet (100 mg total) by mouth once as needed for up to 1 dose., Disp: 30 tablet, Rfl: 0     ROS:  Review of Systems BREAST: No symptoms   Objective: LMP 08/21/2018    OBGyn Exam  Results: No results found for this or any previous visit (from the past 24 hour(s)).  Assessment/Plan: No diagnosis found.  No orders of the defined types were placed in this encounter.            GYN counsel {counseling: 16159}     F/U  No follow-ups on file.  Laureen Frederic B. Katalena Malveaux, PA-C 02/17/2022 7:59 PM

## 2022-02-18 ENCOUNTER — Ambulatory Visit: Payer: Commercial Managed Care - PPO | Admitting: Obstetrics and Gynecology

## 2022-02-18 DIAGNOSIS — Z1231 Encounter for screening mammogram for malignant neoplasm of breast: Secondary | ICD-10-CM

## 2022-02-18 DIAGNOSIS — Z803 Family history of malignant neoplasm of breast: Secondary | ICD-10-CM

## 2022-02-18 DIAGNOSIS — Z01419 Encounter for gynecological examination (general) (routine) without abnormal findings: Secondary | ICD-10-CM

## 2022-02-18 DIAGNOSIS — Z9189 Other specified personal risk factors, not elsewhere classified: Secondary | ICD-10-CM

## 2022-03-26 ENCOUNTER — Other Ambulatory Visit: Payer: Self-pay | Admitting: Obstetrics and Gynecology

## 2022-03-26 DIAGNOSIS — N390 Urinary tract infection, site not specified: Secondary | ICD-10-CM

## 2022-03-29 NOTE — Telephone Encounter (Signed)
Pls let pt know annual due and we can RF prn till appt. Thx.

## 2022-04-14 ENCOUNTER — Other Ambulatory Visit: Payer: Self-pay | Admitting: Obstetrics and Gynecology

## 2022-04-14 DIAGNOSIS — N390 Urinary tract infection, site not specified: Secondary | ICD-10-CM

## 2022-04-14 DIAGNOSIS — B9689 Other specified bacterial agents as the cause of diseases classified elsewhere: Secondary | ICD-10-CM

## 2022-04-14 MED ORDER — TRIMETHOPRIM 100 MG PO TABS: 100.0000 mg | ORAL_TABLET | Freq: Once | ORAL | 0 refills | Status: DC | PRN

## 2022-04-14 NOTE — Telephone Encounter (Signed)
Pt called our office to request RF on trimethoprim. Annual scheduled on 05/27/22.

## 2022-04-14 NOTE — Progress Notes (Signed)
Rx RF trimethoprim, has 5/24 annual

## 2022-05-26 NOTE — Progress Notes (Unsigned)
PCP:  Stephanie Stabile, MD   No chief complaint on file.    HPI:      [name] is a 32 y.o. [id number] whose LMP was Patient's last menstrual period was 08/21/2018., presents today for her annual examination.  Her menses are absent due to lap hyst BSO  Sex activity: {sex active: 315163}.  Last Pap: 12/22/20 Results were: no abnormalities /neg HPV DNA  Hx of STDs: {STD hx:14358}  Hx of BV, last treated 1/24. Hx of recurrent UTIs, sex as trigger. Started bactrim after sex. Seen urology in past  Last mammogram: 04/28/20 Results were: normal--routine follow-up in 12 months/neg breast MRI 5/22 due to LT breast mass There is no FH of breast cancer. There is no FH of ovarian cancer. The patient {does:18564} do self-breast exams.  Tobacco use: {tob:20664} Alcohol use: {Alcohol:11675} No drug use.  Exercise: {exercise:31265}  She {does:18564} get adequate calcium and Vitamin D in her diet.  Patient Active Problem List   Diagnosis Date Noted   Increased risk of breast cancer 02/17/2022   Recurrent UTI 01/11/2022   Family history of breast cancer 04/22/2020   S/P laparoscopic hysterectomy 09/15/2018   Pelvic pain 08/29/2018   Dysmetabolic syndrome 04/10/2018   Over weight 04/10/2018   Obesity (BMI 30-39.9) 02/08/2018   Syncope 06/22/2017   History of cesarean section 02/01/2017   Dysmenorrhea 09/28/2016   Headache syndrome 01/19/2016   Endometriosis 06/06/2015    Past Surgical History:  Procedure Laterality Date   ABDOMINAL HYSTERECTOMY     ABLATION ON ENDOMETRIOSIS  2017   CESAREAN SECTION  2011   CESAREAN SECTION WITH BILATERAL TUBAL LIGATION N/A 07/05/2017   Procedure: CESAREAN SECTION WITH BILATERAL TUBAL LIGATION;  Surgeon: Conard Novak, MD;  Location: [facility];  Service: Obstetrics;  Laterality: N/A;   CHROMOPERTUBATION N/A 06/06/2015   Procedure: CHROMOPERTUBATION;  Surgeon: Nadara Mustard, MD;  Location: [facility];  Service: Gynecology;  Laterality: N/A;    LAPAROSCOPY N/A 06/06/2015   Procedure: LAPAROSCOPY OPERATIVE/ EXCISION OF ENDOMETRIOSIS;  Surgeon: Nadara Mustard, MD;  Location: [facility];  Service: Gynecology;  Laterality: N/A;   TONSILLECTOMY     TOTAL LAPAROSCOPIC HYSTERECTOMY WITH BILATERAL SALPINGO OOPHORECTOMY N/A 09/07/2018   Procedure: TOTAL LAPAROSCOPIC HYSTERECTOMY WITH BILATERAL SALPINGO;  Surgeon: Nadara Mustard, MD;  Location: [facility];  Service: Gynecology;  Laterality: N/A;   TOTAL LAPAROSCOPIC HYSTERECTOMY WITH SALPINGECTOMY  09/2018   with Dr. Tiburcio Pea, endometriosis; still has bilat ovaries per path report   TUBAL LIGATION     WISDOM TOOTH EXTRACTION  04-2015    Family History  Problem Relation Age of Onset   Hypertension Mother    Diabetes Mother    Migraines Mother    Heart failure Mother        Coded during delivery of second child   Hypertension Father    Diabetes Father    Heart attack Father 68   Brain cancer Paternal Grandfather    Brain cancer Cousin    Breast cancer Paternal Aunt 44   Breast cancer Paternal Aunt 6   Breast cancer Paternal Aunt 70   Breast cancer Maternal Aunt 35    Social History   Socioeconomic History   Marital status: Single    Spouse name: Not on file   Number of children: 1   Years of education: College   Highest education level: Not on file  Occupational History   Occupation: [employer]  Tobacco Use  Smoking status: Never   Smokeless tobacco: Never  Vaping Use   Vaping Use: Never used  Substance and Sexual Activity   Alcohol use: Not Currently    Comment: rare   Drug use: No   Sexual activity: Yes    Partners: Male    Birth control/protection: Surgical    Comment: Hysterectomy  Other Topics Concern   Not on file  Social History Narrative   Lives at home w/ her husband and daughter   Right-handed   Caffeine: coffee in the morning, tea throughout the day   Social Determinants of Health   Financial Resource Strain: Not on file  Food Insecurity: Not on  file  Transportation Needs: Not on file  Physical Activity: Not on file  Stress: Not on file  Social Connections: Not on file  Intimate Partner Violence: Not on file     Current Outpatient Medications:    metroNIDAZOLE (FLAGYL) 500 MG tablet, Take 1 tab BID for 7 days; NO alcohol use for 10 days after prescription start, Disp: 14 tablet, Rfl: 0   traZODone (DESYREL) 50 MG tablet, Take 50 mg by mouth at bedtime., Disp: , Rfl:    trimethoprim (TRIMPEX) 100 MG tablet, Take 1 tablet (100 mg total) by mouth once as needed for up to 1 dose., Disp: 30 tablet, Rfl: 0     ROS:  Review of Systems BREAST: No symptoms   Objective: LMP 08/21/2018    OBGyn Exam  Results: No results found for this or any previous visit (from the past 24 hour(s)).  Assessment/Plan: No diagnosis found.  No orders of the defined types were placed in this encounter.            GYN counsel {counseling: 16159}     F/U  No follow-ups on file.  Viann Nielson B. Lutricia Widjaja, PA-C 05/26/2022 9:08 PM

## 2022-05-27 ENCOUNTER — Encounter: Payer: Self-pay | Admitting: Obstetrics and Gynecology

## 2022-05-27 ENCOUNTER — Ambulatory Visit (INDEPENDENT_AMBULATORY_CARE_PROVIDER_SITE_OTHER): Payer: Commercial Managed Care - PPO | Admitting: Obstetrics and Gynecology

## 2022-05-27 VITALS — BP 108/70 | Ht 68.0 in | Wt 182.0 lb

## 2022-05-27 DIAGNOSIS — N390 Urinary tract infection, site not specified: Secondary | ICD-10-CM

## 2022-05-27 DIAGNOSIS — N951 Menopausal and female climacteric states: Secondary | ICD-10-CM

## 2022-05-27 DIAGNOSIS — Z803 Family history of malignant neoplasm of breast: Secondary | ICD-10-CM

## 2022-05-27 DIAGNOSIS — Z01419 Encounter for gynecological examination (general) (routine) without abnormal findings: Secondary | ICD-10-CM | POA: Diagnosis not present

## 2022-05-27 DIAGNOSIS — Z1231 Encounter for screening mammogram for malignant neoplasm of breast: Secondary | ICD-10-CM

## 2022-05-27 DIAGNOSIS — Z1239 Encounter for other screening for malignant neoplasm of breast: Secondary | ICD-10-CM

## 2022-05-27 DIAGNOSIS — Z0000 Encounter for general adult medical examination without abnormal findings: Secondary | ICD-10-CM

## 2022-05-27 DIAGNOSIS — Z9189 Other specified personal risk factors, not elsewhere classified: Secondary | ICD-10-CM

## 2022-05-27 NOTE — Patient Instructions (Signed)
I value your feedback and you entrusting us with your care. If you get a [place] patient survey, I would appreciate you taking the time to let us know about your experience today. Thank you! ? ? ?

## 2022-05-28 LAB — COMPREHENSIVE METABOLIC PANEL
ALT: 14 IU/L (ref 0–32)
AST: 16 IU/L (ref 0–40)
Albumin/Globulin Ratio: 1.8 (ref 1.2–2.2)
Albumin: 4.4 g/dL (ref 3.9–4.9)
Alkaline Phosphatase: 47 IU/L (ref 44–121)
BUN/Creatinine Ratio: 9 (ref 9–23)
BUN: 7 mg/dL (ref 6–20)
Bilirubin Total: 0.6 mg/dL (ref 0.0–1.2)
CO2: 20 mmol/L (ref 20–29)
Calcium: 8.9 mg/dL (ref 8.7–10.2)
Chloride: 103 mmol/L (ref 96–106)
Creatinine, Ser: 0.8 mg/dL (ref 0.57–1.00)
Globulin, Total: 2.4 g/dL (ref 1.5–4.5)
Glucose: 83 mg/dL (ref 70–99)
Potassium: 4.4 mmol/L (ref 3.5–5.2)
Sodium: 139 mmol/L (ref 134–144)
Total Protein: 6.8 g/dL (ref 6.0–8.5)
eGFR: 100 mL/min/{1.73_m2} (ref >59)

## 2022-05-28 LAB — ESTRADIOL: Estradiol: 137 pg/mL

## 2022-05-28 LAB — FSH/LH
FSH: 4.1 m[IU]/mL
LH: 6.4 m[IU]/mL

## 2022-05-28 LAB — AMMONIA: Ammonia: 39 ug/dL (ref 30–130)

## 2022-07-21 ENCOUNTER — Ambulatory Visit
Admission: RE | Admit: 2022-07-21 | Discharge: 2022-07-21 | Disposition: A | Discharge status: Home / Self Care | Payer: Commercial Managed Care - PPO | Source: Ambulatory Visit | Attending: Obstetrics and Gynecology | Admitting: Obstetrics and Gynecology

## 2022-07-21 DIAGNOSIS — Z803 Family history of malignant neoplasm of breast: Secondary | ICD-10-CM | POA: Diagnosis present

## 2022-07-21 DIAGNOSIS — Z1231 Encounter for screening mammogram for malignant neoplasm of breast: Secondary | ICD-10-CM | POA: Insufficient documentation

## 2022-07-21 DIAGNOSIS — Z9189 Other specified personal risk factors, not elsewhere classified: Secondary | ICD-10-CM | POA: Diagnosis present

## 2022-07-22 ENCOUNTER — Other Ambulatory Visit: Payer: Self-pay | Admitting: Obstetrics and Gynecology

## 2022-07-22 ENCOUNTER — Telehealth: Payer: Self-pay | Admitting: Obstetrics and Gynecology

## 2022-07-22 DIAGNOSIS — N6489 Other specified disorders of breast: Secondary | ICD-10-CM

## 2022-07-22 DIAGNOSIS — R928 Other abnormal and inconclusive findings on diagnostic imaging of breast: Secondary | ICD-10-CM

## 2022-07-22 NOTE — Telephone Encounter (Signed)
Patient called in reference to her mammogram.  She stated that they saw something on it.  She also said that you mentioned that her chances of having breast cancer is high.  She wants to move forward in having double mastectomy. She still has questions and would like you to give her a call.

## 2022-07-22 NOTE — Telephone Encounter (Signed)
Spoke with pt. Orders signed for dx mammo and u/s. If neg, pt wants prophylactic mastectomy and reconstruction. Pt to research whom she wants to see. I recommended [facility].

## 2022-07-26 ENCOUNTER — Ambulatory Visit
Admission: RE | Admit: 2022-07-26 | Discharge: 2022-07-26 | Disposition: A | Discharge status: Home / Self Care | Payer: Commercial Managed Care - PPO | Source: Ambulatory Visit | Attending: Obstetrics and Gynecology | Admitting: Obstetrics and Gynecology

## 2022-07-26 DIAGNOSIS — R928 Other abnormal and inconclusive findings on diagnostic imaging of breast: Secondary | ICD-10-CM | POA: Insufficient documentation

## 2022-07-26 DIAGNOSIS — N6489 Other specified disorders of breast: Secondary | ICD-10-CM | POA: Diagnosis present

## 2022-07-27 ENCOUNTER — Other Ambulatory Visit: Payer: Self-pay | Admitting: Obstetrics and Gynecology

## 2022-07-27 ENCOUNTER — Encounter: Payer: Self-pay | Admitting: Obstetrics and Gynecology

## 2022-07-27 DIAGNOSIS — Z9189 Other specified personal risk factors, not elsewhere classified: Secondary | ICD-10-CM

## 2022-07-27 DIAGNOSIS — R928 Other abnormal and inconclusive findings on diagnostic imaging of breast: Secondary | ICD-10-CM

## 2022-07-27 DIAGNOSIS — Z803 Family history of malignant neoplasm of breast: Secondary | ICD-10-CM

## 2022-07-27 DIAGNOSIS — N63 Unspecified lump in unspecified breast: Secondary | ICD-10-CM

## 2022-08-02 ENCOUNTER — Ambulatory Visit
Admission: RE | Admit: 2022-08-02 | Discharge: 2022-08-02 | Disposition: A | Discharge status: Home / Self Care | Payer: Commercial Managed Care - PPO | Source: Ambulatory Visit | Attending: Obstetrics and Gynecology | Admitting: Obstetrics and Gynecology

## 2022-08-02 DIAGNOSIS — R928 Other abnormal and inconclusive findings on diagnostic imaging of breast: Secondary | ICD-10-CM | POA: Diagnosis not present

## 2022-08-02 DIAGNOSIS — N63 Unspecified lump in unspecified breast: Secondary | ICD-10-CM | POA: Diagnosis present

## 2022-08-02 HISTORY — PX: BREAST BIOPSY: SHX20

## 2022-08-02 MED ORDER — LIDOCAINE HCL 1 % IJ SOLN
2.0000 mL | Freq: Once | INTRAMUSCULAR | Status: AC
  Administered 2022-08-02: 2 mL via INTRADERMAL

## 2022-08-02 MED ORDER — LIDOCAINE-EPINEPHRINE 1 %-1:100000 IJ SOLN
5.0000 mL | Freq: Once | INTRAMUSCULAR | Status: AC
  Administered 2022-08-02: 5 mL

## 2022-08-03 NOTE — Telephone Encounter (Signed)
Returned patients call. Stephanie Hayden is aware of patient needing note and has sent communication to [facility] for completion.

## 2022-08-03 NOTE — Telephone Encounter (Signed)
Yes, pls send note for pt. Thx.

## 2022-08-03 NOTE — Telephone Encounter (Signed)
Pt called and said Dr.'s office called to see how she was doing after the bx. She told her she was at work ([employer]) and they advised her she needs to be on light duty today and tomorrow. No lifting >25 lbs. If we can please send a note via mychart for work purposes.

## 2022-08-19 ENCOUNTER — Other Ambulatory Visit: Payer: Self-pay | Admitting: Obstetrics and Gynecology

## 2022-08-19 ENCOUNTER — Institutional Professional Consult (permissible substitution): Payer: Commercial Managed Care - PPO | Admitting: Plastic Surgery

## 2022-08-19 DIAGNOSIS — N390 Urinary tract infection, site not specified: Secondary | ICD-10-CM

## 2022-08-25 ENCOUNTER — Encounter: Payer: Self-pay | Admitting: Obstetrics and Gynecology

## 2022-09-02 NOTE — Telephone Encounter (Signed)
Spoke with pt after her consultation with Dr. Dwain Sarna. Pt has referral to plastic surgery.

## 2022-11-26 ENCOUNTER — Other Ambulatory Visit: Payer: Commercial Managed Care - PPO

## 2022-12-08 ENCOUNTER — Ambulatory Visit: Admission: RE | Admit: 2022-12-08 | Payer: Commercial Managed Care - PPO | Source: Ambulatory Visit

## 2022-12-13 ENCOUNTER — Ambulatory Visit
Admission: RE | Admit: 2022-12-13 | Discharge: 2022-12-13 | Disposition: A | Discharge status: Home / Self Care | Payer: Commercial Managed Care - PPO | Source: Ambulatory Visit | Attending: Obstetrics and Gynecology | Admitting: Obstetrics and Gynecology

## 2022-12-13 DIAGNOSIS — D242 Benign neoplasm of left breast: Secondary | ICD-10-CM | POA: Insufficient documentation

## 2022-12-13 DIAGNOSIS — Z803 Family history of malignant neoplasm of breast: Secondary | ICD-10-CM | POA: Insufficient documentation

## 2022-12-13 DIAGNOSIS — Z9189 Other specified personal risk factors, not elsewhere classified: Secondary | ICD-10-CM | POA: Insufficient documentation

## 2022-12-13 DIAGNOSIS — Z1239 Encounter for other screening for malignant neoplasm of breast: Secondary | ICD-10-CM | POA: Diagnosis present

## 2022-12-13 MED ORDER — GADOBUTROL 1 MMOL/ML IV SOLN
7.5000 mL | Freq: Once | INTRAVENOUS | Status: AC | PRN
  Administered 2022-12-13: 7.5 mL via INTRAVENOUS

## 2022-12-28 IMAGING — CT CT ABD-PEL WO/W CM
2 of 8 series · 13 of 46 positions shown, 18 images · IV contrast (agent unspecified)
Comparison: 11/04/2018

CLINICAL DATA: Hematuria and recurrent urinary tract infections.
History of endometriosis. Partial hysterectomy.

EXAM:
CT ABDOMEN AND PELVIS WITHOUT AND WITH CONTRAST
TECHNIQUE: Multidetector CT imaging of the abdomen and pelvis was performed
following the standard protocol before and following the bolus
administration of intravenous contrast.

[Series 6: coronal pre · coronal · non-contrast · 0.77mm/px · 3 of 83 slices shown]
[im 21/83  soft-tissue]
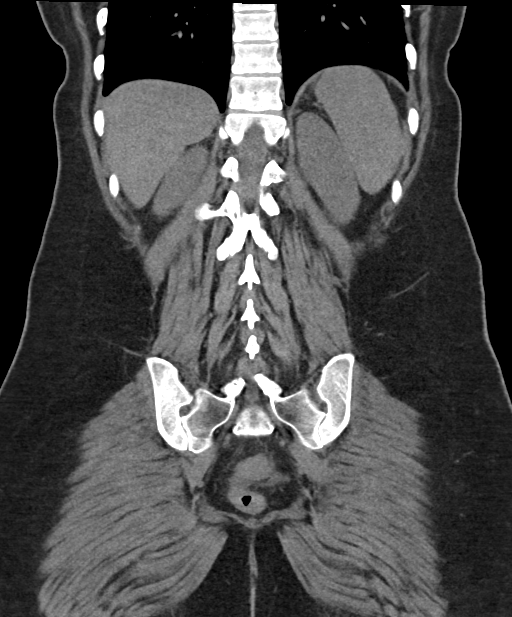
[im 42/83  soft-tissue]
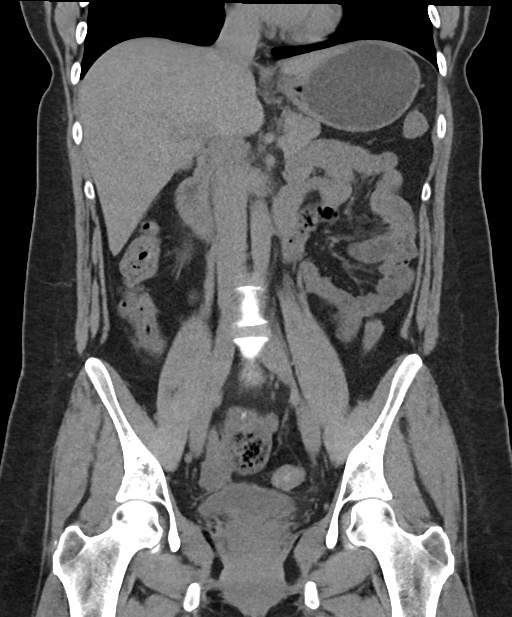
[im 62/83  soft-tissue]
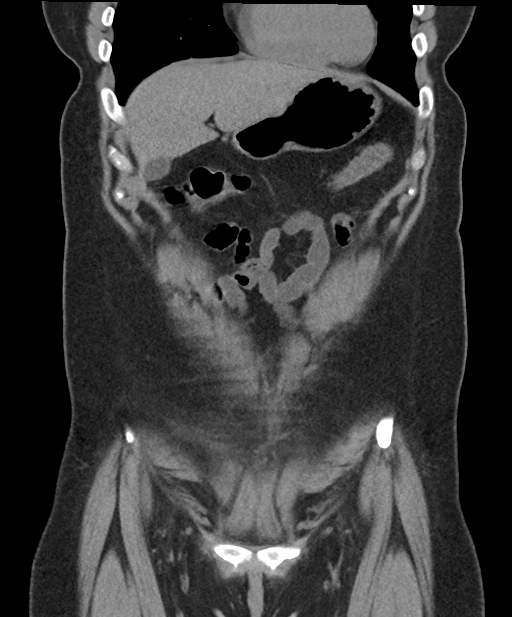

[Series 8: post prone · axial · 0.86mm/px · z∈[+725,+1155]mm · 10 of 104 slices shown, 15 images]
[im 9/104  soft-tissue]
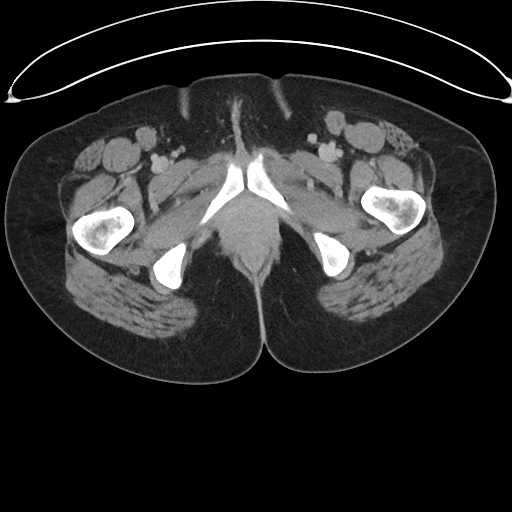
[im 9/104  bone]
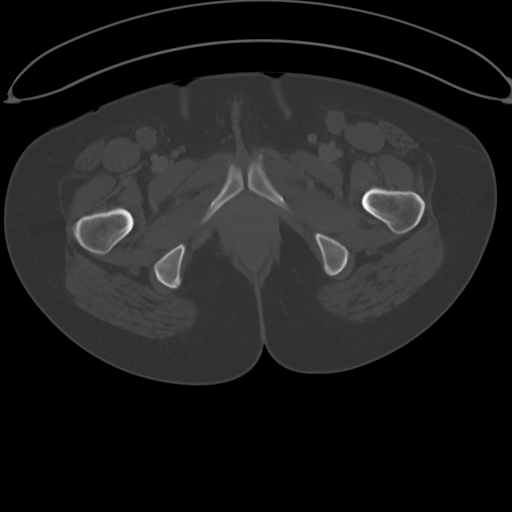
[im 18/104  soft-tissue]
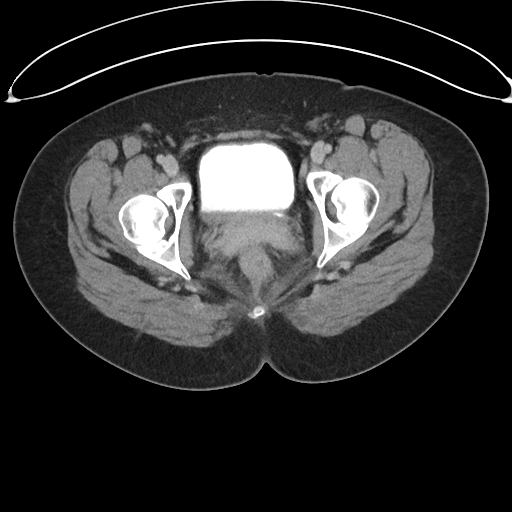
[im 35/104  soft-tissue]
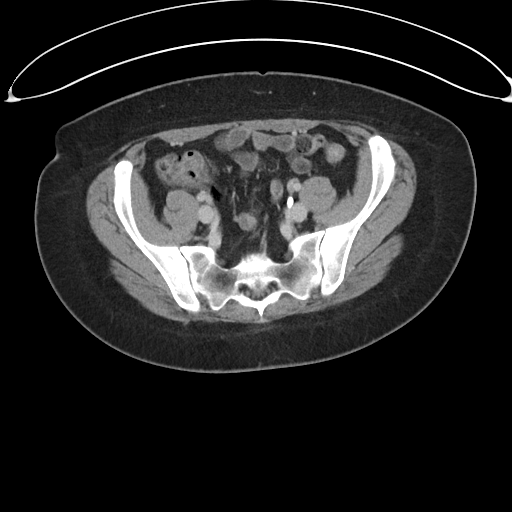
[im 43/104  soft-tissue]
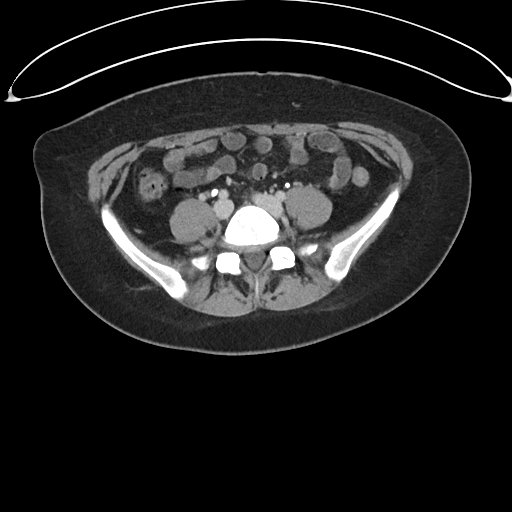
[im 52/104  soft-tissue]
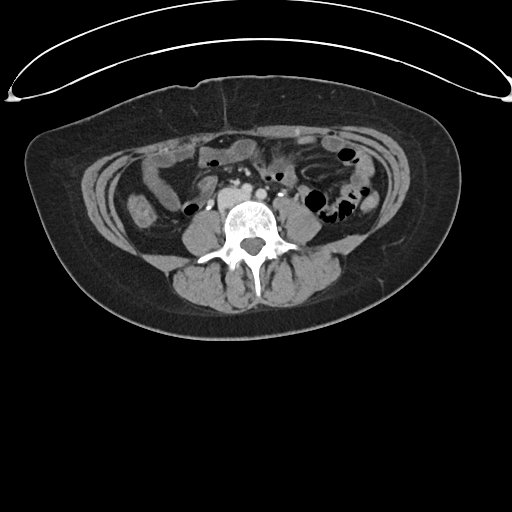
[im 61/104  soft-tissue]
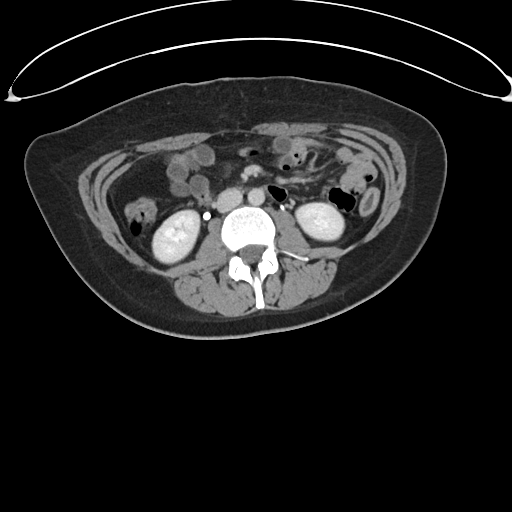
[im 69/104  soft-tissue]
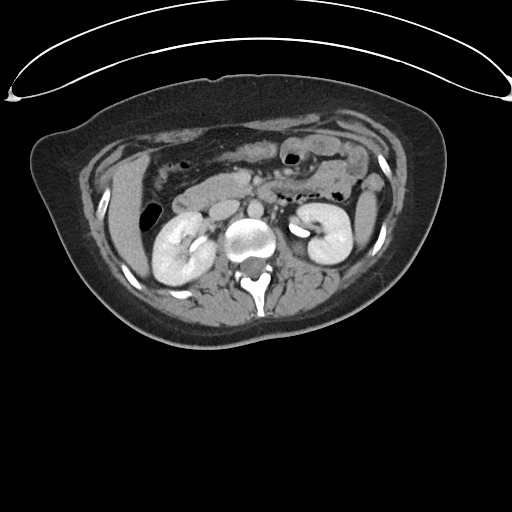
[im 69/104  lung]
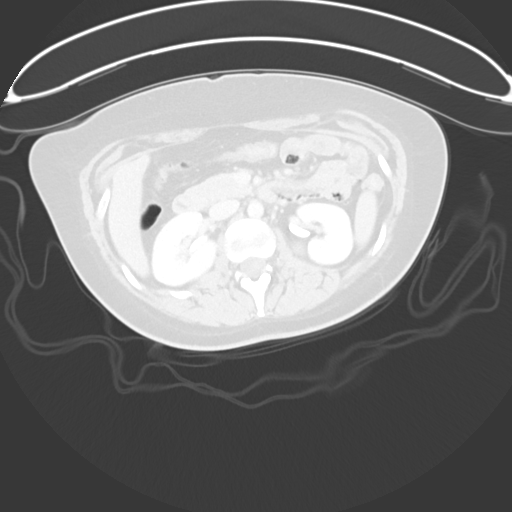
[im 78/104  lung]
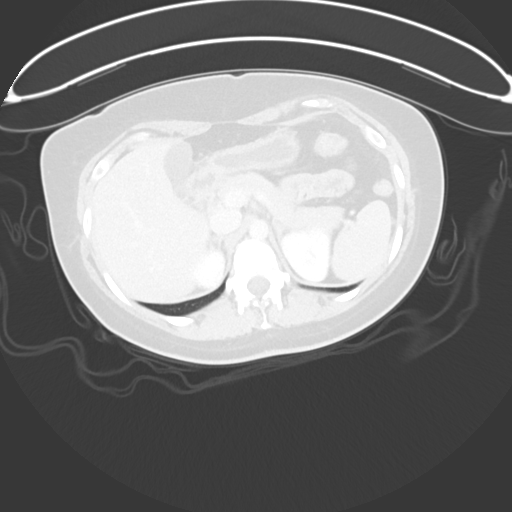
[im 86/104  soft-tissue]
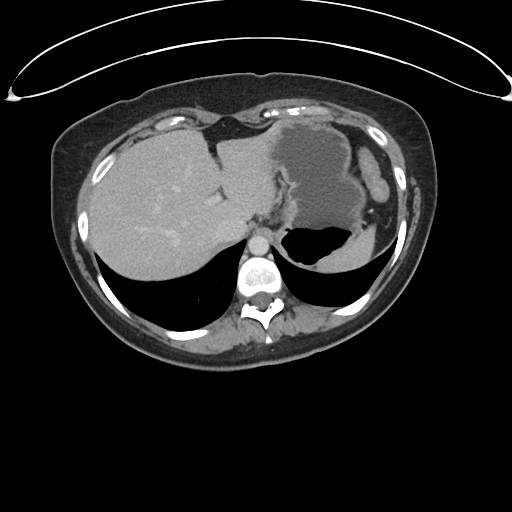
[im 86/104  lung]
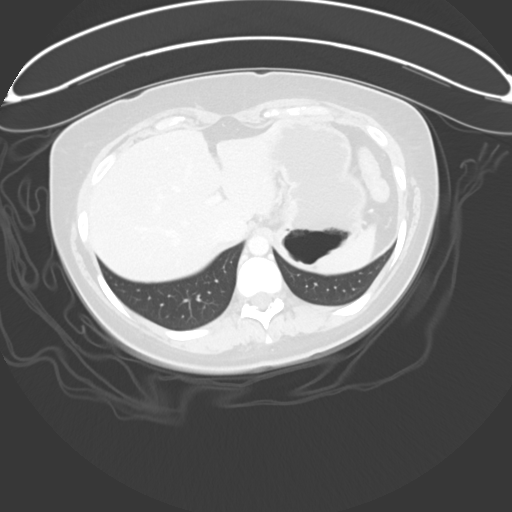
[im 95/104  soft-tissue]
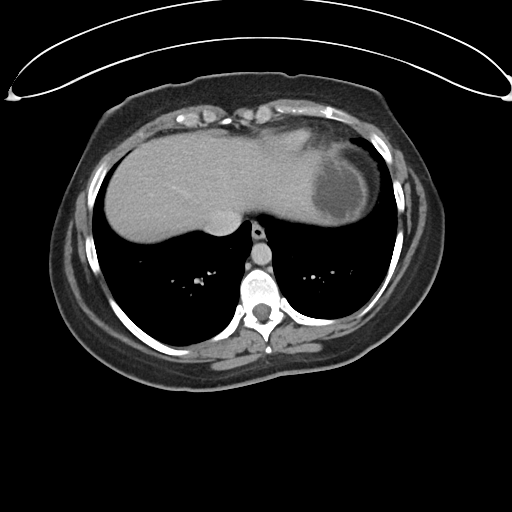
[im 95/104  lung]
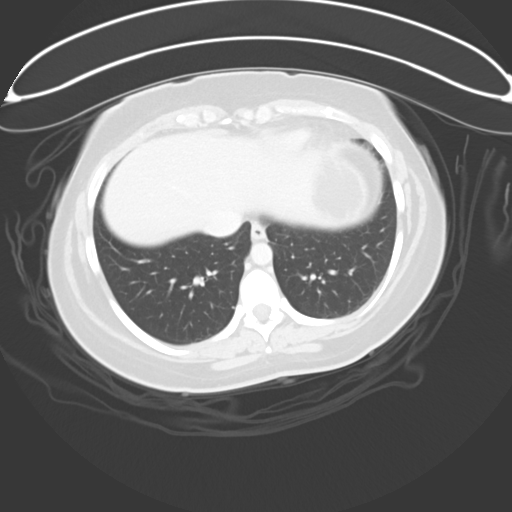
[im 95/104  bone]
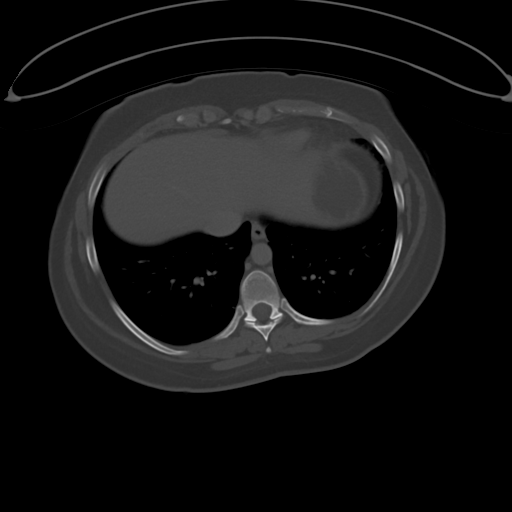

[13 of 46 positions shown; findings below may reference images not displayed]

RADIATION DOSE REDUCTION: This exam was performed according to the
departmental dose-optimization program which includes automated
exposure control, adjustment of the mA and/or kV according to
patient size and/or use of iterative reconstruction technique.

CONTRAST:  100mL OMNIPAQUE IOHEXOL 300 MG/ML  SOLN
FINDINGS: Lower chest: 3 mm right lower lobe pulmonary nodule with similar on
the prior and presumed benign. Normal heart size without pericardial
or pleural effusion.

Hepatobiliary: Segment 2-3 11 mm hepatic cyst. Normal gallbladder,
without biliary ductal dilatation.

Pancreas: Normal, without mass or ductal dilatation.

Spleen: Normal in size, without focal abnormality.

Adrenals/Urinary Tract: Normal adrenal glands. No renal calculi or
hydronephrosis. No hydroureter or ureteric calculi. No bladder
calculi. No renal mass on post-contrast imaging. Moderate renal
collecting system opacification. Portions of the right ureter
incompletely opacified. No filling defects in the opacified ureters
or collecting systems.

No filling defect within the urinary bladder. No air within the
bladder evidence of significant bladder wall thickening.

Stomach/Bowel: Normal stomach, without wall thickening. Normal
colon, appendix, and terminal ileum. Normal small bowel.

Vascular/Lymphatic: Normal caliber of the aorta and branch vessels.
No abdominopelvic adenopathy.

Reproductive: Hysterectomy. Right ovarian corpus luteal cyst of
cm on 82/8.

Other: Trace pelvic fluid is likely physiologic. No free
intraperitoneal air. No endometriosis implants identified.

Musculoskeletal: No acute osseous abnormality. Degenerate disc
disease at the lumbosacral junction.
IMPRESSION: 1. No acute process or explanation for recurrent urinary tract
infections.
2. Right ovarian corpus luteal cyst.

## 2023-03-17 ENCOUNTER — Emergency Department (HOSPITAL_COMMUNITY)
Admission: EM | Admit: 2023-03-17 | Discharge: 2023-03-17 | Disposition: A | Discharge status: Home / Self Care | Attending: Emergency Medicine | Admitting: Emergency Medicine

## 2023-03-17 ENCOUNTER — Other Ambulatory Visit: Payer: Self-pay

## 2023-03-17 DIAGNOSIS — U071 COVID-19: Secondary | ICD-10-CM | POA: Insufficient documentation

## 2023-03-17 DIAGNOSIS — R509 Fever, unspecified: Secondary | ICD-10-CM | POA: Diagnosis present

## 2023-03-17 DIAGNOSIS — J101 Influenza due to other identified influenza virus with other respiratory manifestations: Secondary | ICD-10-CM | POA: Diagnosis not present

## 2023-03-17 LAB — RESP PANEL BY RT-PCR (RSV, FLU A&B, COVID)  RVPGX2
Influenza A by PCR: POSITIVE — AB
Influenza B by PCR: NEGATIVE
Resp Syncytial Virus by PCR: NEGATIVE
SARS Coronavirus 2 by RT PCR: NEGATIVE

## 2023-03-17 LAB — GROUP A STREP BY PCR: Group A Strep by PCR: NOT DETECTED

## 2023-03-17 NOTE — ED Provider Notes (Signed)
 Stephanie Hayden   CSN: [id number] Arrival date & time: 03/17/23  5366     History  Chief Complaint  Patient presents with   Oral Swelling   Fever    Stephanie Hayden is a 33 y.o. female.  33 year old female who presents to the emergency department with congestion, sore throat, swelling to the right side of her neck.  Also has had a mild cough.  Fevers and chills with Tmax of 100.9 F.  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  No history of head or neck surgery aside from having her tonsils removed.  Says that it feels like it swollen under the right side of her jaw.  Has a strange sensation by her tongue as well.  Saw a telemedicine doctor who told her to come in to the emergency department for an in person evaluation.       Home Medications Prior to Admission medications   Medication Sig Start Date End Date Taking? Authorizing Provider  phentermine (ADIPEX-P) 37.5 MG tablet Take 37.5 mg by mouth daily. 05/13/22   [provider]  sertraline (ZOLOFT) 25 MG tablet Take by mouth. 01/17/21   [provider]  SODIUM FLUORIDE 5000 PPM 1.1 % PSTE Take by mouth at bedtime. 03/22/22   [provider]  traZODone (DESYREL) 50 MG tablet Take 50 mg by mouth at bedtime. 12/15/21   [provider]  trimethoprim (TRIMPEX) 100 MG tablet TAKE 1 TABLET BY MOUTH ONCE DAILY AS NEEDED FOR  UP TO 1 DOSE 08/19/22   Copland, Ilona Sorrel, PA-C      Allergies    Coconut flavoring agent (non-screening) and Morphine and codeine    Review of Systems   Review of Systems  Physical Exam Updated Vital Signs BP 125/86   Pulse 79   Temp 98.1 F (36.7 C) (Oral)   Resp 17   Ht 5\' 7"  (1.702 m)   Wt 77.1 kg   LMP 08/21/2018   SpO2 98%   BMI 26.63 kg/m  Physical Exam Vitals and nursing Hayden reviewed.  Constitutional:      General: She is not in acute distress.    Appearance: She is  well-developed.  HENT:     Head: Normocephalic and atraumatic.     Right Ear: Tympanic membrane, ear canal and external ear normal.     Left Ear: Tympanic membrane, ear canal and external ear normal.     Nose: Congestion present.     Mouth/Throat:     Pharynx: Posterior oropharyngeal erythema present. No oropharyngeal exudate.  Eyes:     Extraocular Movements: Extraocular movements intact.     Conjunctiva/sclera: Conjunctivae normal.     Pupils: Pupils are equal, round, and reactive to light.  Pulmonary:     Effort: Pulmonary effort is normal.     Breath sounds: No stridor.  Musculoskeletal:     Cervical back: Normal range of motion and neck supple.  Lymphadenopathy:     Cervical: Cervical adenopathy (Right-sided anterior and posterior.  Large submandibular lymph node) present.  Skin:    General: Skin is warm and dry.  Neurological:     Mental Status: She is alert and oriented to person, place, and time. Mental status is at baseline.  Psychiatric:        Mood and Affect: Mood normal.     ED Results / Procedures / Treatments   Labs (all labs ordered are  listed, but only abnormal results are displayed) Labs Reviewed  RESP PANEL BY RT-PCR (RSV, FLU A&B, COVID)  RVPGX2 - Abnormal; Notable for the following components:      Result Value   Influenza A by PCR POSITIVE (*)    All other components within normal limits  GROUP A STREP BY PCR    EKG None  Radiology No results found.  Procedures Procedures    Medications Ordered in ED Medications - No data to display  ED Course/ Medical Decision Making/ A&P                                 Medical Decision Making  Stephanie Hayden is a 33 y.o. female with comorbidities that complicate the patient evaluation including tonsillectomy who presents with sore throat  Initial Ddx:  Strep throat, PTA, RPA, viral pharyngitis, pneumonia   MDM:  Feel the patient likely has a viral URI/pharyngitis based on their symptoms.  Does  have some lymph nodes on the right side that are swollen as well.  Tolerating secretions and no uvular deviation no be concerning for peritonsillar abscess.  Neck is supple no concerns for RPA at this time.  No airway concerns currently.  They are overall well-appearing so do not feel that chest x-ray is warranted.   Plan:  COVID/flu Strep test  ED Summary/Re-evaluation:  Patient found to have influenza A.  Feel that the lymph nodes on the right side of her neck are likely reactive.  Did also consider sialadenitis but feel that this is less likely.  Counseled on symptomatic care for her flu and will have her follow-up with her primary doctor in several days.  This patient presents to the ED for concern of complaints listed in HPI, this involves an extensive number of treatment options, and is a complaint that carries with it a high risk of complications and morbidity. Disposition including potential need for admission considered.   Dispo: DC Home. Return precautions discussed including, but not limited to, those listed in the AVS. Allowed pt time to ask questions which were answered fully prior to dc.  Records reviewed Outpatient Clinic Notes I independently reviewed the following imaging with scope of interpretation limited to determining acute life threatening conditions related to emergency care: Chest x-ray and agree with the radiologist interpretation with the following exceptions: None I personally reviewed and interpreted cardiac monitoring: normal sinus rhythm  I have reviewed the patients home medications and made adjustments as needed   Final Clinical Impression(s) / ED Diagnoses Final diagnoses:  Influenza A    Rx / DC Orders ED Discharge Orders     None         Rondel Baton, MD 03/17/23 1020

## 2023-03-17 NOTE — Discharge Instructions (Signed)
 You were seen for your neck pain and the flu in the emergency department.   At home, please take Tylenol and ibuprofen for your pain.  You may try sour candies like lemon drops to see if this decreases the swelling in your neck in case it is from a salivary gland.    Check your MyChart online for the results of any tests that had not resulted by the time you left the emergency department.   Follow-up with your primary doctor in 2-3 days regarding your visit.    Return immediately to the emergency department if you experience any of the following: Difficulty breathing, or any other concerning symptoms.    Thank you for visiting our Emergency Department. It was a pleasure taking care of you today.

## 2023-03-17 NOTE — ED Triage Notes (Signed)
 Pt arrived POV from home with c/o fever (100.9), feeling bad, since yesterday. This morning pt woke up with right side throat swollen and hard , hard to swallow. Pt took benadryl 25mg  and tylenol 500 mg at 0630am. No airway issues voiced.

## 2023-03-18 ENCOUNTER — Ambulatory Visit
Admission: EM | Admit: 2023-03-18 | Discharge: 2023-03-18 | Disposition: A | Discharge status: Home / Self Care | Attending: Family Medicine | Admitting: Family Medicine

## 2023-03-18 ENCOUNTER — Other Ambulatory Visit: Payer: Self-pay

## 2023-03-18 ENCOUNTER — Encounter: Payer: Self-pay | Admitting: Emergency Medicine

## 2023-03-18 DIAGNOSIS — R221 Localized swelling, mass and lump, neck: Secondary | ICD-10-CM

## 2023-03-18 DIAGNOSIS — J111 Influenza due to unidentified influenza virus with other respiratory manifestations: Secondary | ICD-10-CM | POA: Diagnosis not present

## 2023-03-18 MED ORDER — AMOXICILLIN-POT CLAVULANATE 875-125 MG PO TABS: 1.0000 | ORAL_TABLET | Freq: Two times a day (BID) | ORAL | 0 refills | Status: DC

## 2023-03-18 NOTE — ED Provider Notes (Signed)
 [facility]    CSN: [id number] Arrival date & time: 03/18/23  0801      History   Chief Complaint Chief Complaint  Patient presents with   Oral Swelling    HPI Stephanie Hayden is a 33 y.o. female.   Patient presenting today with 1 day history of progressively worsening right sided neck swelling.  Is also having upper respiratory symptoms and fevers and bodyaches, was seen for all of this in the emergency department yesterday and diagnosed with influenza A.  States strep testing was negative there and this was not treated separately from the influenza but size doubled overnight.  Denies known dental issues, headaches, difficulty breathing or swallowing.  Trying compresses and ibuprofen with minimal relief.    Past Medical History:  Diagnosis Date   BRCA negative 04/2020   MyRisk neg   Complication of anesthesia    difficulty waking up   Endometriosis    Family history of adverse reaction to anesthesia    mother also diffucult to wake up after    Family history of breast cancer    GERD (gastroesophageal reflux disease)    ONLY WHEN PREGNANT   Increased risk of breast cancer 04/2020   IBIS=25.8%/riskscore=22.3%   Phlebitis following infusion    LEFT ARM-AFTER HAVING WISDOME TEETH TAKEN OUT IN APRIL 2017   Urinary tract infection     Patient Active Problem List   Diagnosis Date Noted   Increased risk of breast cancer 02/17/2022   Recurrent UTI 01/11/2022   Family history of breast cancer 04/22/2020   S/P laparoscopic hysterectomy 09/15/2018   Pelvic pain 08/29/2018   Dysmetabolic syndrome 04/10/2018   Over weight 04/10/2018   Obesity (BMI 30-39.9) 02/08/2018   Syncope 06/22/2017   History of cesarean section 02/01/2017   Dysmenorrhea 09/28/2016   Headache syndrome 01/19/2016   Endometriosis 06/06/2015    Past Surgical History:  Procedure Laterality Date   ABDOMINAL HYSTERECTOMY     ABLATION ON ENDOMETRIOSIS  2017   BREAST BIOPSY Left 08/02/2022    [place] LT BREAST BX W LOC DEV 1ST LESION IMG BX SPEC US GUIDE 08/02/2022 [facility]   CESAREAN SECTION  2011   CESAREAN SECTION WITH BILATERAL TUBAL LIGATION N/A 07/05/2017   Procedure: CESAREAN SECTION WITH BILATERAL TUBAL LIGATION;  Surgeon: Conard Novak, MD;  Location: [facility];  Service: Obstetrics;  Laterality: N/A;   CHROMOPERTUBATION N/A 06/06/2015   Procedure: CHROMOPERTUBATION;  Surgeon: Nadara Mustard, MD;  Location: [facility];  Service: Gynecology;  Laterality: N/A;   LAPAROSCOPY N/A 06/06/2015   Procedure: LAPAROSCOPY OPERATIVE/ EXCISION OF ENDOMETRIOSIS;  Surgeon: Nadara Mustard, MD;  Location: [facility];  Service: Gynecology;  Laterality: N/A;   TONSILLECTOMY     TOTAL LAPAROSCOPIC HYSTERECTOMY WITH BILATERAL SALPINGO OOPHORECTOMY N/A 09/07/2018   Procedure: TOTAL LAPAROSCOPIC HYSTERECTOMY WITH BILATERAL SALPINGO;  Surgeon: Nadara Mustard, MD;  Location: [facility];  Service: Gynecology;  Laterality: N/A;   TOTAL LAPAROSCOPIC HYSTERECTOMY WITH SALPINGECTOMY Bilateral 09/2018   Dr. Tiburcio Pea; BSO NOT DONE   TUBAL LIGATION     WISDOM TOOTH EXTRACTION  04/2015    OB History     Gravida  2   Para  2   Term  2   Preterm      AB      Living  2      SAB      IAB      Ectopic      Multiple  0  Live Births  2            Home Medications    Prior to Admission medications   Medication Sig Start Date End Date Taking? Authorizing Provider  amoxicillin-clavulanate (AUGMENTIN) 875-125 MG tablet Take 1 tablet by mouth every 12 (twelve) hours. 03/18/23  Yes Particia Nearing, PA-C  phentermine (ADIPEX-P) 37.5 MG tablet Take 37.5 mg by mouth daily. 05/13/22   [provider]  sertraline (ZOLOFT) 25 MG tablet Take by mouth. 01/17/21   [provider]  SODIUM FLUORIDE 5000 PPM 1.1 % PSTE Take by mouth at bedtime. 03/22/22   [provider]  traZODone (DESYREL) 50 MG tablet Take 50 mg by mouth at bedtime. 12/15/21   [provider]  trimethoprim (TRIMPEX) 100 MG tablet TAKE 1 TABLET BY MOUTH ONCE DAILY AS NEEDED FOR  UP TO 1 DOSE 08/19/22   Copland, Ilona Sorrel, PA-C    Family History Family History  Problem Relation Age of Onset   Hypertension Mother    Diabetes Mother    Migraines Mother    Heart failure Mother        Coded during delivery of second child   Hypertension Father    Diabetes Father    Heart attack Father 25   Brain cancer Paternal Grandfather    Brain cancer Cousin    Breast cancer Paternal Aunt 49   Breast cancer Paternal Aunt 64   Breast cancer Paternal Aunt 50   Breast cancer Maternal Aunt 86    Social History Social History   Tobacco Use   Smoking status: Never   Smokeless tobacco: Never  Vaping Use   Vaping status: Never Used  Substance Use Topics   Alcohol use: Not Currently    Comment: rare   Drug use: No     Allergies   Coconut flavoring agent (non-screening) and Morphine and codeine   Review of Systems Review of Systems Per HPI  Physical Exam Triage Vital Signs ED Triage Vitals  Encounter Vitals Group     BP 03/18/23 0819 121/82     Systolic BP Percentile --      Diastolic BP Percentile --      Pulse Rate 03/18/23 0819 83     Resp 03/18/23 0819 18     Temp 03/18/23 0819 98.3 F (36.8 C)     Temp Source 03/18/23 0819 Oral     SpO2 03/18/23 0819 98 %     Weight --      Height --      Head Circumference --      Peak Flow --      Pain Score 03/18/23 0820 6     Pain Loc --      Pain Education --      Exclude from Growth Chart --    No data found.  Updated Vital Signs BP 121/82 (BP Location: Right Arm)   Pulse 83   Temp 98.3 F (36.8 C) (Oral)   Resp 18   LMP 08/21/2018   SpO2 98%   Visual Acuity Right Eye Distance:   Left Eye Distance:   Bilateral Distance:    Right Eye Near:   Left Eye Near:    Bilateral Near:     Physical Exam Vitals and nursing note reviewed.  Constitutional:      Appearance: Normal appearance. She is  not ill-appearing.  HENT:     Head: Atraumatic.     Nose: Rhinorrhea present.  Mouth/Throat:     Mouth: Mucous membranes are moist.     Pharynx: Posterior oropharyngeal erythema present.  Eyes:     Extraocular Movements: Extraocular movements intact.     Conjunctiva/sclera: Conjunctivae normal.  Neck:     Comments: Large swollen firm tender mass to the right jawline extending slightly into the neck.  Smaller similar mass now forming to the left just below the jawline Cardiovascular:     Rate and Rhythm: Normal rate.  Pulmonary:     Effort: Pulmonary effort is normal.     Breath sounds: Normal breath sounds. No wheezing or rales.  Musculoskeletal:        General: Normal range of motion.     Cervical back: Normal range of motion.  Skin:    General: Skin is warm and dry.  Neurological:     Mental Status: She is alert and oriented to person, place, and time.  Psychiatric:        Mood and Affect: Mood normal.        Thought Content: Thought content normal.        Judgment: Judgment normal.      UC Treatments / Results  Labs (all labs ordered are listed, but only abnormal results are displayed) Labs Reviewed - No data to display  EKG   Radiology No results found.  Procedures Procedures (including critical care time)  Medications Ordered in UC Medications - No data to display  Initial Impression / Assessment and Plan / UC Course  I have reviewed the triage vital signs and the nursing notes.  Pertinent labs & imaging results that were available during my care of the patient were reviewed by me and considered in my medical decision making (see chart for details).     Possibly significant adenopathy secondary to influenza, however could also be representative of salivary gland infection or other bacterial etiology.  Will cover with Augmentin in addition to medications already reviewed by emergency department for influenza.  Supportive home care and return precautions  reviewed at length. Final Clinical Impressions(s) / UC Diagnoses   Final diagnoses:  Neck swelling  Influenza     Discharge Instructions      Your swelling may be related to a salivary infection but it is unclear exactly the cause at this time.  We will treat with a course of antibiotics, and continue warm compresses, ibuprofen and close monitoring.  Go to the emergency department if your symptoms worsen at any time, particularly difficulty breathing or swallowing    ED Prescriptions     Medication Sig Dispense Auth. Provider   amoxicillin-clavulanate (AUGMENTIN) 875-125 MG tablet Take 1 tablet by mouth every 12 (twelve) hours. 14 tablet Particia Nearing, [place]      PDMP not reviewed this encounter.   Particia Nearing, [place] 03/18/23 1504

## 2023-03-18 NOTE — Discharge Instructions (Signed)
 Your swelling may be related to a salivary infection but it is unclear exactly the cause at this time.  We will treat with a course of antibiotics, and continue warm compresses, ibuprofen and close monitoring.  Go to the emergency department if your symptoms worsen at any time, particularly difficulty breathing or swallowing

## 2023-03-18 NOTE — ED Triage Notes (Addendum)
 Pt reports right sided neck swelling and discomfort that is worse than yesterday. Pt reports was seen in ED and tested positive for flu A yesterday but reports swelling has increased. Airway patent.NAD noted.

## 2023-07-07 ENCOUNTER — Other Ambulatory Visit: Payer: Self-pay | Admitting: General Surgery

## 2023-07-07 DIAGNOSIS — Z9189 Other specified personal risk factors, not elsewhere classified: Secondary | ICD-10-CM

## 2023-07-20 NOTE — Progress Notes (Unsigned)
 Stephanie Norleen PEDLAR, MD   No chief complaint on file.   HPI:      Stephanie Hayden is a 33 y.o. 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 Ebbie 7/24   Patient Active Problem List   Diagnosis Date Noted   Increased risk of breast cancer 02/17/2022   Recurrent UTI 01/11/2022   Family history of breast cancer 04/22/2020   S/P laparoscopic hysterectomy 09/15/2018   Pelvic pain 08/29/2018   Dysmetabolic syndrome 04/10/2018   Over weight 04/10/2018   Obesity (BMI 30-39.9) 02/08/2018   Syncope 06/22/2017   History of cesarean section 02/01/2017   Dysmenorrhea 09/28/2016   Headache syndrome 01/19/2016   Endometriosis 06/06/2015    Past Surgical History:  Procedure Laterality Date   ABDOMINAL HYSTERECTOMY     ABLATION ON ENDOMETRIOSIS  2017   BREAST BIOPSY Left 08/02/2022   US  LT BREAST BX W LOC DEV 1ST LESION IMG BX SPEC US  GUIDE 08/02/2022 [facility]   CESAREAN SECTION  2011   CESAREAN SECTION WITH BILATERAL TUBAL LIGATION N/A 07/05/2017   Procedure: CESAREAN SECTION WITH BILATERAL TUBAL LIGATION;  Surgeon: Leonce Garnette BIRCH, MD;  Location: [facility];  Service: Obstetrics;  Laterality: N/A;   CHROMOPERTUBATION N/A 06/06/2015   Procedure: CHROMOPERTUBATION;  Surgeon: Lamar SHAUNNA Lesches, MD;  Location: [facility];  Service: Gynecology;  Laterality: N/A;   LAPAROSCOPY N/A 06/06/2015   Procedure: LAPAROSCOPY OPERATIVE/ EXCISION OF ENDOMETRIOSIS;  Surgeon: Lamar SHAUNNA Lesches, MD;  Location: [facility];  Service: Gynecology;  Laterality: N/A;   TONSILLECTOMY     TOTAL LAPAROSCOPIC HYSTERECTOMY WITH BILATERAL SALPINGO OOPHORECTOMY N/A 09/07/2018   Procedure: TOTAL LAPAROSCOPIC HYSTERECTOMY WITH BILATERAL SALPINGO;  Surgeon: Lesches Lamar SHAUNNA, MD;  Location: [facility];  Service: Gynecology;  Laterality: N/A;   TOTAL LAPAROSCOPIC HYSTERECTOMY WITH SALPINGECTOMY  Bilateral 09/2018   Dr. Lesches; BSO NOT DONE   TUBAL LIGATION     WISDOM TOOTH EXTRACTION  04/2015    Family History  Problem Relation Age of Onset   Hypertension Mother    Diabetes Mother    Migraines Mother    Heart failure Mother        Coded during delivery of second child   Hypertension Father    Diabetes Father    Heart attack Father 10   Brain cancer Paternal Grandfather    Brain cancer Cousin    Breast cancer Paternal Aunt 48   Breast cancer Paternal Aunt 4   Breast cancer Paternal Aunt 27   Breast cancer Maternal Aunt 97    Social History   Socioeconomic History   Marital status: Single    Spouse name: Not on file   Number of children: 1   Years of education: College   Highest education level: Not on file  Occupational History   Occupation: Designer, [employer]  Tobacco Use   Smoking status: Never   Smokeless tobacco: Never  Vaping Use   Vaping status: Never Used  Substance and Sexual Activity   Alcohol use: Not Currently    Comment: rare   Drug use: No   Sexual activity: Yes    Partners: Male    Birth control/protection: Surgical    Comment: Hysterectomy  Other Topics Concern   Not on file  Social History Narrative   Lives at home w/ her husband and daughter   Right-handed   Caffeine: coffee  in the morning, tea throughout the day   Social Drivers of Corporate investment banker Strain: Not on file  Food Insecurity: Not on file  Transportation Needs: Not on file  Physical Activity: Not on file  Stress: Not on file  Social Connections: Not on file  Intimate Partner Violence: Not on file    Outpatient Medications Prior to Visit  Medication Sig Dispense Refill   amoxicillin -clavulanate (AUGMENTIN ) 875-125 MG tablet Take 1 tablet by mouth every 12 (twelve) hours. 14 tablet 0   phentermine  (ADIPEX-P ) 37.5 MG tablet Take 37.5 mg by mouth daily.     sertraline (ZOLOFT) 25 MG tablet Take by mouth.     SODIUM FLUORIDE 5000 PPM 1.1 % PSTE Take by  mouth at bedtime.     traZODone (DESYREL) 50 MG tablet Take 50 mg by mouth at bedtime.     trimethoprim  (TRIMPEX ) 100 MG tablet TAKE 1 TABLET BY MOUTH ONCE DAILY AS NEEDED FOR  UP TO 1 DOSE 30 tablet 2   No facility-administered medications prior to visit.      ROS:  Review of Systems BREAST: No symptoms   OBJECTIVE:   Vitals:  LMP 08/21/2018   Physical Exam  Results: No results found for this or any previous visit (from the past 24 hours).   Assessment/Plan: No diagnosis found.    No orders of the defined types were placed in this encounter.     No follow-ups on file.  Jisel Fleet B. Arval Brandstetter, PA-C 07/20/2023 2:01 PM

## 2023-07-21 ENCOUNTER — Ambulatory Visit (INDEPENDENT_AMBULATORY_CARE_PROVIDER_SITE_OTHER): Admitting: Obstetrics and Gynecology

## 2023-07-21 ENCOUNTER — Encounter: Payer: Self-pay | Admitting: Obstetrics and Gynecology

## 2023-07-21 VITALS — BP 100/65 | HR 68 | Ht 68.0 in | Wt 183.0 lb

## 2023-07-21 DIAGNOSIS — Z1231 Encounter for screening mammogram for malignant neoplasm of breast: Secondary | ICD-10-CM

## 2023-07-21 DIAGNOSIS — D242 Benign neoplasm of left breast: Secondary | ICD-10-CM

## 2023-07-21 DIAGNOSIS — B3731 Acute candidiasis of vulva and vagina: Secondary | ICD-10-CM | POA: Diagnosis not present

## 2023-07-21 DIAGNOSIS — N644 Mastodynia: Secondary | ICD-10-CM

## 2023-07-21 DIAGNOSIS — Z9189 Other specified personal risk factors, not elsewhere classified: Secondary | ICD-10-CM | POA: Diagnosis not present

## 2023-07-21 DIAGNOSIS — Z803 Family history of malignant neoplasm of breast: Secondary | ICD-10-CM

## 2023-07-21 MED ORDER — FLUCONAZOLE 150 MG PO TABS: 150.0000 mg | ORAL_TABLET | Freq: Once | ORAL | 0 refills | Status: DC

## 2023-07-21 MED ORDER — FLUCONAZOLE 150 MG PO TABS: 150.0000 mg | ORAL_TABLET | Freq: Once | ORAL | 0 refills | Status: AC

## 2023-07-21 NOTE — Patient Instructions (Signed)
 I value your feedback and you entrusting [place] with your care. If you get a [facility] [place] [facility] patient survey, I would appreciate you taking the time to let us know about your experience today. Thank you! ? ? ?

## 2023-07-29 ENCOUNTER — Ambulatory Visit
Admission: RE | Admit: 2023-07-29 | Discharge: 2023-07-29 | Disposition: A | Discharge status: Home / Self Care | Source: Ambulatory Visit | Attending: Obstetrics and Gynecology | Admitting: Obstetrics and Gynecology

## 2023-07-29 DIAGNOSIS — D242 Benign neoplasm of left breast: Secondary | ICD-10-CM

## 2023-07-29 DIAGNOSIS — Z1231 Encounter for screening mammogram for malignant neoplasm of breast: Secondary | ICD-10-CM | POA: Insufficient documentation

## 2023-07-29 DIAGNOSIS — N644 Mastodynia: Secondary | ICD-10-CM | POA: Insufficient documentation

## 2023-07-31 ENCOUNTER — Ambulatory Visit: Payer: Self-pay | Admitting: Obstetrics and Gynecology

## 2023-08-04 ENCOUNTER — Ambulatory Visit: Admitting: Obstetrics and Gynecology

## 2023-08-04 NOTE — Progress Notes (Deleted)
 PCP:  [name], MD   No chief complaint on file.    HPI:      [name] is a 33 y.o. [id number] whose LMP was Patient's last menstrual period was 08/21/2018., presents today for her annual examination.  Her menses are absent due to lap hyst BS 2020 with Dr. Arloa for endometriosis. S/p LSO 6/17 due to endometriosis. Still has RT ovary.  Getting significant hot flashes regularly.   Sex activity: single partner, contraception - status post hysterectomy.  Last Pap: 12/22/20 Results were: no abnormalities /neg HPV DNA. No hx of abn paps.   Hx of BV, last treated 1/24; sx resolved, doing well.  Hx of recurrent UTIs, sex as trigger. Started bactrim  after sex with huge improvement, doesn't need RF currently. Saw urology in past.  Last mammogram: 07/29/23 Results were: normal--routine follow-up in 12 months/neg breast MRI 12/24.  There is a strong FH of breast cancer in 3 pat aunts and 1 mat aunt. Pt is MyRisk neg 4/22. There is no FH of ovarian cancer. The patient does do self-breast exams. IBIS=25.8%/riskscore=22.3%.  Tobacco use: The patient denies current or previous tobacco use. Alcohol use: social drinker No drug use.  Exercise: moderately active  She does get adequate calcium but not Vitamin D in her diet.  Stool has had ammonia odor for the past month. Yesterday, sweat had strong ammonia odor. No change in meds/supplements. Drinks lots of water.   Patient Active Problem List   Diagnosis Date Noted   Increased risk of breast cancer 02/17/2022   Recurrent UTI 01/11/2022   Family history of breast cancer 04/22/2020   S/P laparoscopic hysterectomy 09/15/2018   Pelvic pain 08/29/2018   Dysmetabolic syndrome 04/10/2018   Over weight 04/10/2018   Obesity (BMI 30-39.9) 02/08/2018   Syncope 06/22/2017   History of cesarean section 02/01/2017   Dysmenorrhea 09/28/2016   Headache syndrome 01/19/2016   Endometriosis 06/06/2015    Past Surgical History:  Procedure  Laterality Date   ABDOMINAL HYSTERECTOMY     ABLATION ON ENDOMETRIOSIS  2017   BREAST BIOPSY Left 08/02/2022   US  LT BREAST BX W LOC DEV 1ST LESION IMG BX SPEC US  GUIDE 08/02/2022 [facility]   CESAREAN SECTION  2011   CESAREAN SECTION WITH BILATERAL TUBAL LIGATION N/A 07/05/2017   Procedure: CESAREAN SECTION WITH BILATERAL TUBAL LIGATION;  Surgeon: Leonce Garnette BIRCH, MD;  Location: [facility];  Service: Obstetrics;  Laterality: N/A;   CHROMOPERTUBATION N/A 06/06/2015   Procedure: CHROMOPERTUBATION;  Surgeon: Lamar SHAUNNA Arloa, MD;  Location: [facility];  Service: Gynecology;  Laterality: N/A;   LAPAROSCOPY N/A 06/06/2015   Procedure: LAPAROSCOPY OPERATIVE/ EXCISION OF ENDOMETRIOSIS;  Surgeon: Lamar SHAUNNA Arloa, MD;  Location: [facility];  Service: Gynecology;  Laterality: N/A;   TONSILLECTOMY     TOTAL LAPAROSCOPIC HYSTERECTOMY WITH BILATERAL SALPINGO OOPHORECTOMY N/A 09/07/2018   Procedure: TOTAL LAPAROSCOPIC HYSTERECTOMY WITH BILATERAL SALPINGO;  Surgeon: Arloa Lamar SHAUNNA, MD;  Location: [facility];  Service: Gynecology;  Laterality: N/A;   TOTAL LAPAROSCOPIC HYSTERECTOMY WITH SALPINGECTOMY Bilateral 09/2018   Dr. Arloa; BSO NOT DONE   TUBAL LIGATION     WISDOM TOOTH EXTRACTION  04/2015    Family History  Problem Relation Age of Onset   Hypertension Mother    Diabetes Mother    Migraines Mother    Heart failure Mother        Coded during delivery of second child   Hypertension Father    Diabetes Father  Heart attack Father 59   Brain cancer Paternal Grandfather    Brain cancer Cousin    Breast cancer Paternal Aunt 31   Breast cancer Paternal Aunt 52   Breast cancer Paternal Aunt 69   Breast cancer Maternal Aunt 71    Social History   Socioeconomic History   Marital status: Single    Spouse name: Not on file   Number of children: 1   Years of education: College   Highest education level: Not on file  Occupational History   Occupation: Designer, [employer]  Tobacco Use    Smoking status: Never   Smokeless tobacco: Never  Vaping Use   Vaping status: Never Used  Substance and Sexual Activity   Alcohol use: Not Currently    Comment: rare   Drug use: No   Sexual activity: Yes    Partners: Male    Birth control/protection: Surgical    Comment: Hysterectomy  Other Topics Concern   Not on file  Social History Narrative   Lives at home w/ her husband and daughter   Right-handed   Caffeine: coffee in the morning, tea throughout the day   Social Drivers of Corporate investment banker Strain: Not on file  Food Insecurity: Not on file  Transportation Needs: Not on file  Physical Activity: Not on file  Stress: Not on file  Social Connections: Not on file  Intimate Partner Violence: Not on file     Current Outpatient Medications:    escitalopram (LEXAPRO) 5 MG tablet, Take 5 mg by mouth daily., Disp: , Rfl:    traZODone (DESYREL) 50 MG tablet, Take 50 mg by mouth at bedtime., Disp: , Rfl:    trimethoprim  (TRIMPEX ) 100 MG tablet, TAKE 1 TABLET BY MOUTH ONCE DAILY AS NEEDED FOR  UP TO 1 DOSE, Disp: 30 tablet, Rfl: 2     ROS:  Review of Systems  Constitutional:  Negative for fatigue, fever and unexpected weight change.  Respiratory:  Negative for cough, shortness of breath and wheezing.   Cardiovascular:  Negative for chest pain, palpitations and leg swelling.  Gastrointestinal:  Negative for blood in stool, constipation, diarrhea, nausea and vomiting.  Endocrine: Negative for cold intolerance, heat intolerance and polyuria.  Genitourinary:  Negative for dyspareunia, dysuria, flank pain, frequency, genital sores, hematuria, menstrual problem, pelvic pain, urgency, vaginal bleeding, vaginal discharge and vaginal pain.  Musculoskeletal:  Negative for back pain, joint swelling and myalgias.  Skin:  Negative for rash.  Neurological:  Negative for dizziness, syncope, light-headedness, numbness and headaches.  Hematological:  Negative for adenopathy.   Psychiatric/Behavioral:  Positive for agitation and dysphoric mood. Negative for confusion, sleep disturbance and suicidal ideas. The patient is not nervous/anxious.    BREAST: No symptoms   Objective: LMP 08/21/2018    Physical Exam Constitutional:      Appearance: She is well-developed.  Genitourinary:     Vulva normal.     Genitourinary Comments: UTERUS/CX SURG REM     Right Labia: No rash, tenderness or lesions.    Left Labia: No tenderness, lesions or rash.    Vaginal cuff intact.    No vaginal discharge, erythema or tenderness.      Right Adnexa: not tender and no mass present.    Left Adnexa: not tender and no mass present.    Cervix is absent.     Uterus is absent.  Breasts:    Right: No mass, nipple discharge, skin change or tenderness.  Left: No mass, nipple discharge, skin change or tenderness.  Neck:     Thyroid: No thyromegaly.  Cardiovascular:     Rate and Rhythm: Normal rate and regular rhythm.     Heart sounds: Normal heart sounds. No murmur heard. Pulmonary:     Effort: Pulmonary effort is normal.     Breath sounds: Normal breath sounds.  Chest:    Abdominal:     Palpations: Abdomen is soft.     Tenderness: There is no abdominal tenderness. There is no guarding.  Musculoskeletal:        General: Normal range of motion.     Cervical back: Normal range of motion.  Neurological:     General: No focal deficit present.     Mental Status: She is alert and oriented to person, place, and time.     Cranial Nerves: No cranial nerve deficit.  Skin:    General: Skin is warm and dry.  Psychiatric:        Mood and Affect: Mood normal.        Behavior: Behavior normal.        Thought Content: Thought content normal.        Judgment: Judgment normal.  Vitals reviewed.     Assessment/Plan: Encounter for annual routine gynecological examination  Encounter for screening mammogram for malignant neoplasm of breast - Plan: MM 3D SCREENING MAMMOGRAM  BILATERAL BREAST; pt to schedule mammo  Family history of breast cancer - Plan: MM 3D SCREENING MAMMOGRAM BILATERAL BREAST, MR BREAST BILATERAL W WO CONTRAST INC CAD; pt is MyRisk neg.   Increased risk of breast cancer - Plan: MM 3D SCREENING MAMMOGRAM BILATERAL BREAST, MR BREAST BILATERAL W WO CONTRAST INC CAD; aware of recommendations of monthly SBE, yearly CBE (can do Q6 months) and mammos, as well as scr breast MRI. Pt to schedule mammo and then scr breast MRI within 6 months of mammo. Increase Vit D. Discussed prophylactic mastectomy/chemoprevention if desires. Still has RT ovary.   Encounter for breast cancer screening using non-mammogram modality - Plan: MR BREAST BILATERAL W WO CONTRAST INC CAD; pt to schedule within 6 months of mammo  Recurrent UTI--does with with abx PC prophylaxis; will call for Rx RF prn.   Perimenopausal vasomotor symptoms - Plan: FSH/LH, Estradiol ; check labs since has 1 ovary. Will f/u with results. Can still have ERT with breast cancer risk.   Blood tests for routine general physical examination - Plan: Comprehensive metabolic panel, Ammonia; check labs due to ammonia smell.         GYN counsel breast self exam, mammography screening, adequate intake of calcium and vitamin D, diet and exercise     F/U  No follow-ups on file.  Telina Kleckley B. Khadejah Son, PA-C 08/04/2023 8:10 AM

## 2023-09-29 ENCOUNTER — Ambulatory Visit (INDEPENDENT_AMBULATORY_CARE_PROVIDER_SITE_OTHER): Admitting: Obstetrics and Gynecology

## 2023-09-29 ENCOUNTER — Encounter: Payer: Self-pay | Admitting: Obstetrics and Gynecology

## 2023-09-29 VITALS — BP 101/65 | HR 66 | Ht 68.0 in | Wt 186.0 lb

## 2023-09-29 DIAGNOSIS — N930 Postcoital and contact bleeding: Secondary | ICD-10-CM

## 2023-09-29 DIAGNOSIS — N9089 Other specified noninflammatory disorders of vulva and perineum: Secondary | ICD-10-CM | POA: Diagnosis not present

## 2023-09-29 NOTE — Patient Instructions (Signed)
 I value your feedback and you entrusting [place] with your care. If you get a [facility] [place] [facility] patient survey, I would appreciate you taking the time to let us know about your experience today. Thank you! ? ? ?

## 2023-09-29 NOTE — Progress Notes (Signed)
 Stephanie Norleen PEDLAR, MD   Chief Complaint  Patient presents with   Vaginal Exam    Vaginal lips feel raw x 1 month. Bled after intercourse on Monday and had abnormal odor? No discharge, odor or itching currently. Saw PCP on Tuesday did a self swab and was advised results would be sent to [facility]. Pt does not know results of self swab. PCP also advised to discuss estrogen vag cream with [facility].    HPI:      [name] is a 33 y.o. [id number] whose LMP was Patient's last menstrual period was 08/21/2018., presents today for labial irritation for the past month, feel raw. Saw PCP with neg swab. Pt had been using dial soap, changed to dove sens skin soap without sx change. Pt doesn't wear underwear and wears jeans/leggings daily. Has helped with recurrent UTIs. No increased vag d/c or odor, no urin sx. Saw PCP again 2 days again with swab done, still waiting on results. Pt was sexually active 3 days ago and noted tearing vaginally during sex with a little bleeding. Didn't feel dry/no pain. Doesn't use lubricants.  She is s/p TAHLSO but has RTO.    Patient Active Problem List   Diagnosis Date Noted   Increased risk of breast cancer 02/17/2022   Recurrent UTI 01/11/2022   Family history of breast cancer 04/22/2020   S/P laparoscopic hysterectomy 09/15/2018   Pelvic pain 08/29/2018   Dysmetabolic syndrome 04/10/2018   Over weight 04/10/2018   Obesity (BMI 30-39.9) 02/08/2018   Syncope 06/22/2017   History of cesarean section 02/01/2017   Dysmenorrhea 09/28/2016   Headache syndrome 01/19/2016   Endometriosis 06/06/2015    Past Surgical History:  Procedure Laterality Date   ABDOMINAL HYSTERECTOMY     ABLATION ON ENDOMETRIOSIS  2017   BREAST BIOPSY Left 08/02/2022   US  LT BREAST BX W LOC DEV 1ST LESION IMG BX SPEC US  GUIDE 08/02/2022 [facility]   CESAREAN SECTION  2011   CESAREAN SECTION WITH BILATERAL TUBAL LIGATION N/A 07/05/2017   Procedure: CESAREAN SECTION WITH BILATERAL TUBAL  LIGATION;  Surgeon: Stephanie Garnette BIRCH, MD;  Location: [facility];  Service: Obstetrics;  Laterality: N/A;   CHROMOPERTUBATION N/A 06/06/2015   Procedure: CHROMOPERTUBATION;  Surgeon: Stephanie Hayden Lesches, MD;  Location: [facility];  Service: Gynecology;  Laterality: N/A;   LAPAROSCOPY N/A 06/06/2015   Procedure: LAPAROSCOPY OPERATIVE/ EXCISION OF ENDOMETRIOSIS;  Surgeon: Stephanie Hayden Lesches, MD;  Location: [facility];  Service: Gynecology;  Laterality: N/A;   TONSILLECTOMY     TOTAL LAPAROSCOPIC HYSTERECTOMY WITH BILATERAL SALPINGO OOPHORECTOMY N/A 09/07/2018   Procedure: TOTAL LAPAROSCOPIC HYSTERECTOMY WITH BILATERAL SALPINGO;  Surgeon: Hayden Stephanie SHAUNNA, MD;  Location: [facility];  Service: Gynecology;  Laterality: N/A;   TOTAL LAPAROSCOPIC HYSTERECTOMY WITH SALPINGECTOMY Bilateral 09/2018   Dr. Lesches; BSO NOT DONE   TUBAL LIGATION     WISDOM TOOTH EXTRACTION  04/2015    Family History  Problem Relation Age of Onset   Hypertension Mother    Diabetes Mother    Migraines Mother    Heart failure Mother        Coded during delivery of second child   Hypertension Father    Diabetes Father    Heart attack Father 13   Brain cancer Paternal Grandfather    Brain cancer Cousin    Breast cancer Paternal Aunt 68   Breast cancer Paternal Aunt 10   Breast cancer Paternal Aunt 11   Breast cancer Maternal Aunt  60    Social History   Socioeconomic History   Marital status: Single    Spouse name: Not on file   Number of children: 1   Years of education: College   Highest education level: Not on file  Occupational History   Occupation: [employer]  Tobacco Use   Smoking status: Never   Smokeless tobacco: Never  Vaping Use   Vaping status: Never Used  Substance and Sexual Activity   Alcohol use: Not Currently    Comment: rare   Drug use: No   Sexual activity: Yes    Partners: Male    Birth control/protection: Surgical    Comment: Hysterectomy  Other Topics Concern   Not on file  Social  History Narrative   Lives at home w/ her husband and daughter   Right-handed   Caffeine: coffee in the morning, tea throughout the day   Social Drivers of Corporate investment banker Strain: Not on file  Food Insecurity: Not on file  Transportation Needs: Not on file  Physical Activity: Not on file  Stress: Not on file  Social Connections: Not on file  Intimate Partner Violence: Not on file    Outpatient Medications Prior to Visit  Medication Sig Dispense Refill   buPROPion  (WELLBUTRIN  XL) 150 MG 24 hr tablet Take 150 mg by mouth daily.     doxycycline (VIBRAMYCIN) 100 MG capsule Take 100 mg by mouth 2 (two) times daily.     traZODone (DESYREL) 50 MG tablet Take 50 mg by mouth at bedtime.     escitalopram (LEXAPRO) 5 MG tablet Take 5 mg by mouth daily.     trimethoprim  (TRIMPEX ) 100 MG tablet TAKE 1 TABLET BY MOUTH ONCE DAILY AS NEEDED FOR  UP TO 1 DOSE 30 tablet 2   No facility-administered medications prior to visit.      ROS:  Review of Systems  Constitutional:  Negative for fever.  Gastrointestinal:  Negative for blood in stool, constipation, diarrhea, nausea and vomiting.  Genitourinary:  Positive for vaginal bleeding and vaginal pain. Negative for dyspareunia, dysuria, flank pain, frequency, hematuria, urgency and vaginal discharge.  Musculoskeletal:  Negative for back pain.  Skin:  Negative for rash.   BREAST: No symptoms   OBJECTIVE:   Vitals:  BP 101/65   Pulse 66   Ht 5' 8 (1.727 m)   Wt 186 lb (84.4 kg)   LMP 08/21/2018   BMI 28.28 kg/m   Physical Exam Vitals reviewed.  Constitutional:      Appearance: She is well-developed.  Pulmonary:     Effort: Pulmonary effort is normal.  Genitourinary:    General: Normal vulva.     Pubic Area: No rash.      Labia:        Right: Tenderness present. No rash or lesion.        Left: No rash, tenderness or lesion.      Vagina: Normal. No vaginal discharge, erythema or tenderness.     Adnexa:        Left:  No mass or tenderness.     Musculoskeletal:        General: Normal range of motion.     Cervical back: Normal range of motion.  Skin:    General: Skin is warm and dry.  Neurological:     General: No focal deficit present.     Mental Status: She is alert and oriented to person, place, and time.  Psychiatric:  Mood and Affect: Mood normal.        Behavior: Behavior normal.        Thought Content: Thought content normal.        Judgment: Judgment normal.    Assessment/Plan: Labial irritation--neg exam. Treat with OTC hydrocortisone crm BID for 2-3 days while waiting for culture results from PCP. Cont dove soap, question rubbing of pants vaginally causing irritation. No vag ERT samples available today. If culture neg and hydrocortisone crm doesn't work, will try vag ERT Rx.  PCB (post coital bleeding)--tear at post fourchette per pt, healed today. Add lubricants first. If still problematic, can add vag ERT. Pt to f/u with sx.     Return if symptoms worsen or fail to improve.  Keagan Brislin B. Mercedies Ganesh, PA-C 09/29/2023 11:57 AM

## 2023-10-03 ENCOUNTER — Encounter: Payer: Self-pay | Admitting: Obstetrics and Gynecology

## 2023-10-04 ENCOUNTER — Other Ambulatory Visit: Payer: Self-pay | Admitting: Obstetrics and Gynecology

## 2023-10-04 DIAGNOSIS — N9089 Other specified noninflammatory disorders of vulva and perineum: Secondary | ICD-10-CM

## 2023-10-04 MED ORDER — ESTRADIOL 0.1 MG/GM VA CREA: TOPICAL_CREAM | VAGINAL | 0 refills | Status: AC

## 2023-10-04 NOTE — Progress Notes (Signed)
 Rx estrace  vag ERT for labial irritation/PCB.

## 2023-12-20 ENCOUNTER — Telehealth: Payer: Self-pay

## 2023-12-20 NOTE — Telephone Encounter (Signed)
 TRIAGE VOICEMAIL: Patient requesting return call. No additional details given.

## 2023-12-22 NOTE — Telephone Encounter (Signed)
 Patient states due to her history she gets mammogram in June alternating with MRI in December mos. She called to schedule her MRI and they do not have an order. Inquiring if she needs to schedule appointment to establish care with new provider since Bernarda has retired. Advised Alicia will still be here a few days most months. She is welcome to schedule appointment as needed with new provider. She is aware Bernarda will not be back in the office until the first full week of January.

## 2023-12-22 NOTE — Telephone Encounter (Signed)
 Pls let pt know MRI order placed. She can schedule at her convenience and I'll follow up with her results. Thx.

## 2023-12-23 NOTE — Telephone Encounter (Signed)
 Patient aware

## 2024-01-04 ENCOUNTER — Ambulatory Visit: Admission: RE | Admit: 2024-01-04 | Source: Ambulatory Visit

## 2024-01-09 ENCOUNTER — Ambulatory Visit: Payer: Self-pay | Admitting: Obstetrics and Gynecology

## 2024-01-09 ENCOUNTER — Ambulatory Visit (HOSPITAL_COMMUNITY)
Admission: RE | Admit: 2024-01-09 | Discharge: 2024-01-09 | Disposition: A | Discharge status: Home / Self Care | Source: Ambulatory Visit | Attending: Obstetrics and Gynecology | Admitting: Obstetrics and Gynecology

## 2024-01-09 DIAGNOSIS — Z1239 Encounter for other screening for malignant neoplasm of breast: Secondary | ICD-10-CM | POA: Diagnosis present

## 2024-01-09 DIAGNOSIS — Z803 Family history of malignant neoplasm of breast: Secondary | ICD-10-CM | POA: Diagnosis present

## 2024-01-09 DIAGNOSIS — Z9189 Other specified personal risk factors, not elsewhere classified: Secondary | ICD-10-CM | POA: Insufficient documentation

## 2024-01-09 MED ORDER — GADOBUTROL 1 MMOL/ML IV SOLN
9.0000 mL | Freq: Once | INTRAVENOUS | Status: AC | PRN
  Administered 2024-01-09: 9 mL via INTRAVENOUS
